# Patient Record
Sex: Male | Born: 1950
Health system: Southern US, Community
[De-identification: ages and names within clinical notes are randomized; demographics above are authoritative.]

## PROBLEM LIST (undated history)

## (undated) DIAGNOSIS — M5416 Radiculopathy, lumbar region: Secondary | ICD-10-CM

## (undated) DIAGNOSIS — I10 Essential (primary) hypertension: Secondary | ICD-10-CM

## (undated) DIAGNOSIS — E785 Hyperlipidemia, unspecified: Secondary | ICD-10-CM

## (undated) DIAGNOSIS — I471 Supraventricular tachycardia, unspecified: Secondary | ICD-10-CM

## (undated) DIAGNOSIS — M1711 Unilateral primary osteoarthritis, right knee: Secondary | ICD-10-CM

## (undated) HISTORY — DX: Unilateral primary osteoarthritis, right knee: M17.11

## (undated) HISTORY — DX: Hyperlipidemia, unspecified: E78.5

## (undated) HISTORY — DX: Supraventricular tachycardia, unspecified: I47.10

## (undated) HISTORY — DX: Radiculopathy, lumbar region: M54.16

## (undated) HISTORY — DX: Essential (primary) hypertension: I10

## (undated) HISTORY — DX: Supraventricular tachycardia: I47.1

---

## 2012-05-21 ENCOUNTER — Encounter: Payer: Self-pay | Admitting: Cardiology

## 2012-09-22 ENCOUNTER — Encounter: Payer: Self-pay | Admitting: Cardiology

## 2012-12-29 ENCOUNTER — Ambulatory Visit (INDEPENDENT_AMBULATORY_CARE_PROVIDER_SITE_OTHER): Payer: No Typology Code available for payment source | Admitting: Sports Medicine

## 2012-12-29 ENCOUNTER — Encounter: Payer: Self-pay | Admitting: Sports Medicine

## 2012-12-29 VITALS — BP 154/90 | HR 73 | Wt 263.0 lb

## 2012-12-29 DIAGNOSIS — M1710 Unilateral primary osteoarthritis, unspecified knee: Secondary | ICD-10-CM

## 2012-12-29 DIAGNOSIS — IMO0002 Reserved for concepts with insufficient information to code with codable children: Secondary | ICD-10-CM

## 2012-12-29 DIAGNOSIS — M170 Bilateral primary osteoarthritis of knee: Secondary | ICD-10-CM | POA: Insufficient documentation

## 2012-12-29 DIAGNOSIS — M1711 Unilateral primary osteoarthritis, right knee: Secondary | ICD-10-CM

## 2012-12-29 MED ORDER — MELOXICAM 15 MG PO TABS: ORAL_TABLET | ORAL | Status: DC

## 2012-12-29 NOTE — Assessment & Plan Note (Signed)
Robert Guerrero has right knee osteoarthritis with degenerative meniscal tear. I performed an aspiration and injection of his right knee, he will take meloxicam, and do home exercises. Like to see him back in about 4 weeks to see how things are going. He should also use his knee brace on a daily basis.

## 2012-12-29 NOTE — Progress Notes (Signed)
  Subjective:    CC: Right knee pain  HPI:  This is a very pleasant 62 year old male, he's had years of knee pain he localizes at the medial joint line on off. He just now got insurance and has been able to have this treated. He recently saw orthopedic surgeon who obtained an MRI the results of which will be dictated below. He recommended surgical treatment initially. Currently symptoms are worse with pain at the medial joint line with occasional popping, locking, clicking, but no buckling. Moderate, persistent. He does get significant swelling. Has used an occasional ibuprofen which is minimally effective.  Past medical history, Surgical history, Family history not pertinant except as noted below, Social history, Allergies, and medications have been entered into the medical record, reviewed, and no changes needed.   Review of Systems: No headache, visual changes, nausea, vomiting, diarrhea, constipation, dizziness, abdominal pain, skin rash, fevers, chills, night sweats, swollen lymph nodes, weight loss, chest pain, body aches, joint swelling, muscle aches, shortness of breath, mood changes, visual or auditory hallucinations.  Objective:    General: Well Developed, well nourished, and in no acute distress.  Neuro: Alert and oriented x3, extra-ocular muscles intact, sensation grossly intact.  HEENT: Normocephalic, atraumatic, pupils equal round reactive to light, neck supple, no masses, no lymphadenopathy, thyroid nonpalpable.  Skin: Warm and dry, no rashes noted.  Cardiac: Regular rate and rhythm, no murmurs rubs or gallops.  Respiratory: Clear to auscultation bilaterally. Not using accessory muscles, speaking in full sentences.  Abdominal: Soft, nontender, nondistended, positive bowel sounds, no masses, no organomegaly.  Right Knee: Visible effusion with palpable fluid. Tender to palpation at the medial joint line. ROM full in flexion and extension and lower leg rotation. Ligaments with  solid consistent endpoints including ACL, PCL, LCL, MCL. Positive McMurray's test with pain but no pop. Non painful patellar compression. Patellar glide without crepitus. Patellar and quadriceps tendons unremarkable. Hamstring and quadriceps strength is normal.   Procedure: Real-time Ultrasound Guided aspiration/Injection of right knee Device: GE Logiq E  Verbal informed consent obtained.  Time-out conducted.  Noted no overlying erythema, induration, or other signs of local infection.  Skin prepped in a sterile fashion.  Local anesthesia: Topical Ethyl chloride.  With sterile technique and under real time ultrasound guidance:  22-gauge needle advanced into the suprapatellar recess, 27 cc of serosanguineous fluid aspirated, it was initially straw-colored but became sanguinous subsequently. Syringe was switched and 2 cc Kenalog 40, 4 cc lidocaine injected easily into the joint. Completed without difficulty  Pain immediately resolved suggesting accurate placement of the medication.  Advised to call if fevers/chills, erythema, induration, drainage, or persistent bleeding.  Images permanently stored and available for review in the ultrasound unit.  Impression: Technically successful ultrasound guided injection.  MRI was reviewed and shows grade 2 and grade 3 chondromalacia in the patellofemoral joint as well as tearing of the posterior horn of the medial meniscus.  Impression and Recommendations:    The patient was counselled, risk factors were discussed, anticipatory guidance given.

## 2013-01-26 ENCOUNTER — Encounter: Payer: Self-pay | Admitting: Sports Medicine

## 2013-01-26 ENCOUNTER — Ambulatory Visit (INDEPENDENT_AMBULATORY_CARE_PROVIDER_SITE_OTHER): Payer: No Typology Code available for payment source | Admitting: Sports Medicine

## 2013-01-26 ENCOUNTER — Ambulatory Visit (INDEPENDENT_AMBULATORY_CARE_PROVIDER_SITE_OTHER): Payer: No Typology Code available for payment source

## 2013-01-26 VITALS — BP 164/85 | HR 94 | Wt 264.0 lb

## 2013-01-26 DIAGNOSIS — M545 Low back pain: Secondary | ICD-10-CM

## 2013-01-26 DIAGNOSIS — M5416 Radiculopathy, lumbar region: Secondary | ICD-10-CM

## 2013-01-26 DIAGNOSIS — IMO0002 Reserved for concepts with insufficient information to code with codable children: Secondary | ICD-10-CM

## 2013-01-26 DIAGNOSIS — R9389 Abnormal findings on diagnostic imaging of other specified body structures: Secondary | ICD-10-CM

## 2013-01-26 MED ORDER — PREDNISONE 50 MG PO TABS: ORAL_TABLET | ORAL | Status: DC

## 2013-01-26 NOTE — Assessment & Plan Note (Signed)
This is fairly classic right-sided L5 radiculitis. Prednisone, x-rays, formal PT, home exercises if too expensive, return to see me in one month, then we can pursue an MRI if no better for interventional injection planning.

## 2013-01-26 NOTE — Progress Notes (Signed)
  Subjective:    CC: Back pain  HPI: This is a very pleasant 62 year old male with a history of osteoarthritis of the knee here for persistent low back pain. The pain is worse worse with prolonged standing or sitting, especially long car rides. It does not bother him at night. The pain also radiates into the right buttock and lateral thigh. He describes some nonspecific numbness in his toes but is unable to pinpoint the location. He denies any loss of bowel or bladder function. He has tried ibuprofen for the back pain without much relief.    Past medical history, Surgical history, Family history not pertinant except as noted below, Social history, Allergies, and medications have been entered into the medical record, reviewed, and no changes needed.   Review of Systems: No fevers, chills, night sweats, weight loss, chest pain, or shortness of breath.   Objective:    General: Well Developed, well nourished, and in no acute distress.  Neuro: Alert and oriented x3, extra-ocular muscles intact, sensation grossly intact.  HEENT: Normocephalic, atraumatic, pupils equal round reactive to light, neck supple, no masses, no lymphadenopathy, thyroid nonpalpable.  Skin: Warm and dry, no rashes. Cardiac: Regular rate and rhythm, no murmurs rubs or gallops, no lower extremity edema.  Respiratory: Clear to auscultation bilaterally. Not using accessory muscles, speaking in full sentences. Back Exam:  Inspection: Unremarkable  Motion: Flexion 45 deg, Extension 45 deg, Side Bending to 45 deg bilaterally,  Rotation to 45 deg bilaterally  SLR laying: Negative  XSLR laying: Negative  Palpable tenderness: None. FABER: negative. Sensory change: Gross sensation intact to all lumbar and sacral dermatomes.  Reflexes: 2+ at both patellar tendons, 2+ at achilles tendons, Babinski's downgoing.  Strength at foot  Plantar-flexion: 5/5 Dorsi-flexion: 5/5 Eversion: 5/5 Inversion: 5/5  Leg strength  Quad: 5/5 Hamstring:  5/5 Hip flexor: 5/5 Hip abductors: 5/5  Gait unremarkable.  Impression and Recommendations:    Assessment: This is a 62 year old male with symptoms consistent with L5 radiculitis.  Plan: 1. Meloxicam daily for 2 weeks, then as needed for pain 2. X-rays today of the lumbar spine 3. Refer to PT 4. Complete home exercises daily if unable to attend PT 5. 5-day course of prednisone  This note was originally written by Karin Lieu MS3.

## 2013-02-23 ENCOUNTER — Encounter: Payer: Self-pay | Admitting: Sports Medicine

## 2013-02-23 ENCOUNTER — Ambulatory Visit (INDEPENDENT_AMBULATORY_CARE_PROVIDER_SITE_OTHER): Payer: No Typology Code available for payment source | Admitting: Sports Medicine

## 2013-02-23 VITALS — BP 147/78 | HR 85 | Wt 269.0 lb

## 2013-02-23 DIAGNOSIS — IMO0002 Reserved for concepts with insufficient information to code with codable children: Secondary | ICD-10-CM

## 2013-02-23 DIAGNOSIS — M1710 Unilateral primary osteoarthritis, unspecified knee: Secondary | ICD-10-CM

## 2013-02-23 DIAGNOSIS — M5416 Radiculopathy, lumbar region: Secondary | ICD-10-CM

## 2013-02-23 DIAGNOSIS — M1711 Unilateral primary osteoarthritis, right knee: Secondary | ICD-10-CM

## 2013-02-23 MED ORDER — TRAMADOL HCL 50 MG PO TABS: ORAL_TABLET | ORAL | Status: DC

## 2013-02-23 MED ORDER — CYCLOBENZAPRINE HCL 10 MG PO TABS: ORAL_TABLET | ORAL | Status: DC

## 2013-02-23 NOTE — Assessment & Plan Note (Signed)
Continues to do very well after injection.

## 2013-02-23 NOTE — Assessment & Plan Note (Signed)
Persistent symptoms and no improvement despite prednisone or Mobic or home exercises. At this point we are going to obtain an MRI for interventional injection planning. He does have L4 on L5 spondylolisthesis. I'm also going to add cyclobenzaprine at bedtime and tramadol for pain during the day. Return to see me to go over MRI results.

## 2013-02-23 NOTE — Progress Notes (Signed)
  Subjective:    CC: Follow up  HPI: Right knee arthritis : pain resolved after injection.  Right lumbar radiculitis: No improvement with prednisone, Mobic, old rehabilitation exercises. X-rays did show L4 on L5 spondylolisthesis, pain continues to radiate down the right leg in L4 versus an L5 distribution.  Past medical history, Surgical history, Family history not pertinant except as noted below, Social history, Allergies, and medications have been entered into the medical record, reviewed, and no changes needed.   Review of Systems: No fevers, chills, night sweats, weight loss, chest pain, or shortness of breath.   Objective:    General: Well Developed, well nourished, and in no acute distress.  Neuro: Alert and oriented x3, extra-ocular muscles intact, sensation grossly intact.  HEENT: Normocephalic, atraumatic, pupils equal round reactive to light, neck supple, no masses, no lymphadenopathy, thyroid nonpalpable.  Skin: Warm and dry, no rashes. Cardiac: Regular rate and rhythm, no murmurs rubs or gallops, no lower extremity edema.  Respiratory: Clear to auscultation bilaterally. Not using accessory muscles, speaking in full sentences.  Impression and Recommendations:

## 2013-03-04 ENCOUNTER — Telehealth: Payer: Self-pay | Admitting: *Deleted

## 2013-03-04 NOTE — Telephone Encounter (Signed)
PA obtained for MRI Lumbar spine w/o. Auth #[id number].  Meyer Cory, LPN

## 2013-03-06 ENCOUNTER — Ambulatory Visit (HOSPITAL_BASED_OUTPATIENT_CLINIC_OR_DEPARTMENT_OTHER)
Admission: RE | Admit: 2013-03-06 | Discharge: 2013-03-06 | Disposition: A | Discharge status: Home / Self Care | Payer: No Typology Code available for payment source | Source: Ambulatory Visit | Attending: Sports Medicine | Admitting: Sports Medicine

## 2013-03-06 DIAGNOSIS — M545 Low back pain, unspecified: Secondary | ICD-10-CM | POA: Insufficient documentation

## 2013-03-06 DIAGNOSIS — M48061 Spinal stenosis, lumbar region without neurogenic claudication: Secondary | ICD-10-CM | POA: Insufficient documentation

## 2013-03-06 DIAGNOSIS — M5416 Radiculopathy, lumbar region: Secondary | ICD-10-CM

## 2013-03-06 DIAGNOSIS — M5126 Other intervertebral disc displacement, lumbar region: Secondary | ICD-10-CM | POA: Insufficient documentation

## 2013-03-09 ENCOUNTER — Encounter: Payer: Self-pay | Admitting: Sports Medicine

## 2013-03-09 ENCOUNTER — Ambulatory Visit (INDEPENDENT_AMBULATORY_CARE_PROVIDER_SITE_OTHER): Payer: No Typology Code available for payment source | Admitting: Sports Medicine

## 2013-03-09 VITALS — BP 155/94 | HR 94 | Wt 268.0 lb

## 2013-03-09 DIAGNOSIS — IMO0002 Reserved for concepts with insufficient information to code with codable children: Secondary | ICD-10-CM

## 2013-03-09 DIAGNOSIS — M5416 Radiculopathy, lumbar region: Secondary | ICD-10-CM

## 2013-03-09 MED ORDER — IBUPROFEN 200 MG PO TABS: 200.0000 mg | ORAL_TABLET | Freq: Four times a day (QID) | ORAL | Status: DC | PRN

## 2013-03-09 NOTE — Assessment & Plan Note (Signed)
Robert Guerrero has multilevel degenerative disc disease, worse at the L4-L5 level, where there is bilateral foraminal stenosis and central canal stenosis. He also has a disc protrusion at the L5-S1 level. At this point we have exhausted physical therapy, steroids, muscle relaxers, tramadol. We are going to proceed with interventional treatment in the form of a bilateral L4-L5 transforaminal epidural steroid injection. I would like to see him back about 2 weeks after the injection to evaluate his response. In terms of oral analgesics, ibuprofen 200 mg has been the most effective.

## 2013-03-09 NOTE — Progress Notes (Signed)
  Subjective:    CC: Follow up  HPI: Lumbar degenerative disc disease: with right-sided lumbar radiculitis, failed physical therapy, steroids, NSAIDs, muscle relaxers, he did obtain an MRI and is here to followup the results for interventional injection planning. Pain is moderate, radiating down the left leg and L4 versus an L5 distribution. Persistent.  Right knee osteoarthritis:  overall pain-free after the injection.  Past medical history, Surgical history, Family history not pertinant except as noted below, Social history, Allergies, and medications have been entered into the medical record, reviewed, and no changes needed.   Review of Systems: No fevers, chills, night sweats, weight loss, chest pain, or shortness of breath.   Objective:    General: Well Developed, well nourished, and in no acute distress.  Neuro: Alert and oriented x3, extra-ocular muscles intact, sensation grossly intact.  HEENT: Normocephalic, atraumatic, pupils equal round reactive to light, neck supple, no masses, no lymphadenopathy, thyroid nonpalpable.  Skin: Warm and dry, no rashes. Cardiac: Regular rate and rhythm, no murmurs rubs or gallops, no lower extremity edema.  Respiratory: Clear to auscultation bilaterally. Not using accessory muscles, speaking in full sentences.  MRI was personally reviewed, there is multilevel degenerative disc disease worse at the L4-L5 and L5-S1 levels, he does have moderate spinal stenosis as well as bilateral foraminal stenosis worse at the L4-L5 level.  Impression and Recommendations:

## 2013-03-15 ENCOUNTER — Ambulatory Visit
Admission: RE | Admit: 2013-03-15 | Discharge: 2013-03-15 | Disposition: A | Discharge status: Home / Self Care | Payer: No Typology Code available for payment source | Source: Ambulatory Visit | Attending: Sports Medicine | Admitting: Sports Medicine

## 2013-03-15 ENCOUNTER — Other Ambulatory Visit: Payer: Self-pay | Admitting: Sports Medicine

## 2013-03-15 DIAGNOSIS — M5416 Radiculopathy, lumbar region: Secondary | ICD-10-CM

## 2013-03-15 MED ORDER — METHYLPREDNISOLONE ACETATE 40 MG/ML INJ SUSP (RADIOLOG
120.0000 mg | Freq: Once | INTRAMUSCULAR | Status: AC
  Administered 2013-03-15: 120 mg via EPIDURAL

## 2013-03-15 MED ORDER — IOHEXOL 180 MG/ML  SOLN
1.0000 mL | Freq: Once | INTRAMUSCULAR | Status: AC | PRN
  Administered 2013-03-15: 1 mL via EPIDURAL

## 2013-03-29 ENCOUNTER — Encounter: Payer: Self-pay | Admitting: Sports Medicine

## 2013-03-29 ENCOUNTER — Ambulatory Visit (INDEPENDENT_AMBULATORY_CARE_PROVIDER_SITE_OTHER): Payer: No Typology Code available for payment source | Admitting: Sports Medicine

## 2013-03-29 VITALS — BP 151/86 | HR 84 | Wt 266.0 lb

## 2013-03-29 DIAGNOSIS — IMO0002 Reserved for concepts with insufficient information to code with codable children: Secondary | ICD-10-CM

## 2013-03-29 DIAGNOSIS — M5416 Radiculopathy, lumbar region: Secondary | ICD-10-CM

## 2013-03-29 DIAGNOSIS — R0789 Other chest pain: Secondary | ICD-10-CM

## 2013-03-29 MED ORDER — ASPIRIN EC 81 MG PO TBEC: 81.0000 mg | DELAYED_RELEASE_TABLET | Freq: Every day | ORAL | Status: DC

## 2013-03-29 MED ORDER — NITROGLYCERIN 0.4 MG SL SUBL: 0.4000 mg | SUBLINGUAL_TABLET | SUBLINGUAL | Status: DC | PRN

## 2013-03-29 NOTE — Assessment & Plan Note (Addendum)
At rest, 2-3 times per month, associated with radiation to the teeth and jaw, and presyncopal symptoms. Symptoms are atypical, and were improved with H2 blockers, and are worse when laying supine, I do think this likely represents more reflux-type symptoms. I am going to refer him to cardiology, I do suspect he still will need a stress test and he needs to follow this up with his primary care provider as I will only be managing his orthopedic problems. I'm also going to add a baby aspirin and nitroglycerin as needed.

## 2013-03-29 NOTE — Progress Notes (Signed)
  Subjective:    CC: Followup  HPI: Lumbar degenerative disc disease : With right-sided lumbar radiculitis, status post bilateral L5-S1 transforaminal epidural injection, pain is now resolved.  Knee pain/osteoarthritis: Resolved.  Chest pain: occurs about 2-3 times per month, substernal, radiates to the jaw and teeth, associated with occasional presyncopal symptoms, no nausea, diaphoresis, or radiation to the left arm, it does last for several hours and sometimes the entire day. He denies any exertional component and in fact tells me that he can walk up to 3 flights of stairs without getting any chest pain. It did wake him at night, and he did notice a slight improvement with H2 blockers. Mild, intermittent.  Past medical history, Surgical history, Family history not pertinant except as noted below, Social history, Allergies, and medications have been entered into the medical record, reviewed, and no changes needed.   Review of Systems: No fevers, chills, night sweats, weight loss, chest pain, or shortness of breath.   Objective:    General: Well Developed, well nourished, and in no acute distress.  Neuro: Alert and oriented x3, extra-ocular muscles intact, sensation grossly intact.  HEENT: Normocephalic, atraumatic, pupils equal round reactive to light, neck supple, no masses, no lymphadenopathy, thyroid nonpalpable.  Skin: Warm and dry, no rashes. Cardiac: Regular rate and rhythm, no murmurs rubs or gallops, no lower extremity edema.  Respiratory: Clear to auscultation bilaterally. Not using accessory muscles, speaking in full sentences. Back Exam:  Inspection: Unremarkable  Motion: Flexion 45 deg, Extension 45 deg, Side Bending to 45 deg bilaterally,  Rotation to 45 deg bilaterally  SLR laying: Negative  XSLR laying: Negative  Palpable tenderness: None. FABER: negative. Sensory change: Gross sensation intact to all lumbar and sacral dermatomes.  Reflexes: 2+ at both patellar  tendons, 2+ at achilles tendons, Babinski's downgoing.  Strength at foot  Plantar-flexion: 5/5 Dorsi-flexion: 5/5 Eversion: 5/5 Inversion: 5/5  Leg strength  Quad: 5/5 Hamstring: 5/5 Hip flexor: 5/5 Hip abductors: 5/5  Gait unremarkable.  Impression and Recommendations:

## 2013-03-29 NOTE — Assessment & Plan Note (Signed)
Resolved with bilateral L5-S1 transforaminal epidural steroid injections.

## 2013-04-07 ENCOUNTER — Encounter: Payer: Self-pay | Admitting: Cardiology

## 2013-05-17 ENCOUNTER — Encounter: Payer: Self-pay | Admitting: Cardiology

## 2013-05-17 ENCOUNTER — Encounter: Payer: Self-pay | Admitting: *Deleted

## 2013-05-19 ENCOUNTER — Encounter: Payer: Self-pay | Admitting: Cardiology

## 2013-05-19 ENCOUNTER — Encounter: Payer: Self-pay | Admitting: *Deleted

## 2013-05-19 ENCOUNTER — Ambulatory Visit (INDEPENDENT_AMBULATORY_CARE_PROVIDER_SITE_OTHER): Payer: No Typology Code available for payment source | Admitting: Cardiology

## 2013-05-19 VITALS — BP 144/90 | HR 99 | Ht 71.0 in | Wt 270.0 lb

## 2013-05-19 DIAGNOSIS — E785 Hyperlipidemia, unspecified: Secondary | ICD-10-CM

## 2013-05-19 DIAGNOSIS — R002 Palpitations: Secondary | ICD-10-CM | POA: Insufficient documentation

## 2013-05-19 DIAGNOSIS — I10 Essential (primary) hypertension: Secondary | ICD-10-CM

## 2013-05-19 DIAGNOSIS — R079 Chest pain, unspecified: Secondary | ICD-10-CM

## 2013-05-19 NOTE — Patient Instructions (Signed)
Your physician recommends that you schedule a follow-up appointment in: [place] has recommended that you wear an event monitor. Event monitors are medical devices that record the heart's electrical activity. Doctors most often [place] these monitors to diagnose arrhythmias. Arrhythmias are problems with the speed or rhythm of the heartbeat. The monitor is a small, portable device. You can wear one while you do your normal daily activities. This is usually used to diagnose what is causing palpitations/syncope (passing out).   Your physician has requested that you have a stress echocardiogram. For further information please visit [email or web address]. Please follow instruction sheet as given.

## 2013-05-19 NOTE — Assessment & Plan Note (Signed)
Obtain most recent lipids from primary care. May need therapy.

## 2013-05-19 NOTE — Progress Notes (Signed)
     HPI: 63 yo male for evaluation of chest pain. No prior cardiac history. Patient states that for the past 2 years he has had problems with chest pain and palpitations. He has the sudden onset of his heart "racing". Associated with this he would develop chest pressure, jaw pressure and bilateral arm pressure. There is dyspnea and diaphoresis but no nausea. The symptoms typically lasts 10-15 minutes and resolve spontaneously. He has had 6 episodes in the past 2 years. His most recent episode occurred 48-72 hours ago. They are not related to exertion. He has mild dyspnea with more extreme activities but not routine activities. No orthopnea, PND, pedal edema or exertional chest pain. No syncope.  Current Outpatient Prescriptions  Medication Sig Dispense Refill  . aspirin EC 81 MG tablet Take 1 tablet (81 mg total) by mouth daily.  90 tablet  3  . ibuprofen (ADVIL,MOTRIN) 200 MG tablet Take 1 tablet (200 mg total) by mouth every 6 (six) hours as needed.  30 tablet  0  . losartan-hydrochlorothiazide (HYZAAR) 100-25 MG per tablet Take 1 tablet by mouth daily.      . metoprolol (LOPRESSOR) 50 MG tablet Take one tablet once daily      . nitroGLYCERIN (NITROSTAT) 0.4 MG SL tablet Place 1 tablet (0.4 mg total) under the tongue every 5 (five) minutes as needed for chest pain (or tightness).  30 tablet  0  . meloxicam (MOBIC) 15 MG tablet One tab PO qAM with breakfast for 2 weeks, then daily prn pain.  30 tablet  3   No current facility-administered medications for this visit.    Allergies  Allergen Reactions  . Penicillins     Past Medical History  Diagnosis Date  . Osteoarthritis of right knee   . Right lumbar radiculitis   . Hypertension   . Hyperlipidemia     History reviewed. No pertinent past surgical history.  History   Social History  . Marital Status: Married    Spouse Name: N/A    Number of Children: 2  . Years of Education: N/A   Occupational History  .      Restaurant  owner   Social History Main Topics  . Smoking status: Never Smoker   . Smokeless tobacco: Not on file  . Alcohol Use: Yes     Comment: Occasional  . Drug Use: Not on file  . Sexual Activity: Not on file   Other Topics Concern  . Not on file   Social History Narrative  . No narrative on file    Family History  Problem Relation Age of Onset  . CAD Father     MI at age 59-60    ROS: arthralgias but no fevers or chills, productive cough, hemoptysis, dysphasia, odynophagia, melena, hematochezia, dysuria, hematuria, rash, seizure activity, orthopnea, PND, pedal edema, claudication. Remaining systems are negative.  Physical Exam:   Blood pressure 144/90, pulse 99, height 5\' 11"  (1.803 m), weight 270 lb (122.471 kg).  General:  Well developed/well nourished in NAD Skin warm/dry Patient not depressed No peripheral clubbing Back-normal HEENT-normal/normal eyelids Neck supple/normal carotid upstroke bilaterally; no bruits; no JVD; no thyromegaly chest - CTA/ normal expansion CV - RRR/normal S1 and S2; no murmurs, rubs or gallops;  PMI nondisplaced Abdomen -NT/ND, no HSM, no mass, + bowel sounds, no bruit 2+ femoral pulses, no bruits Ext-no edema, chords, 2+ DP Neuro-grossly nonfocal  ECG Sinus rhythm with no ST changes

## 2013-05-19 NOTE — Assessment & Plan Note (Signed)
Stress echocardiogram for risk stratification. Symptoms are concerning but only occur in the setting of palpitations. No exertional chest pain.

## 2013-05-19 NOTE — Assessment & Plan Note (Signed)
Blood pressure mildly elevated. However he has not taken metoprolol. Continue present medications and follow.

## 2013-05-19 NOTE — Assessment & Plan Note (Signed)
Patient sounds to be having possible SVT. Plan [facility] to further evaluate. Obtain most recent laboratories from primary care including TSH. Schedule stress echocardiogram.

## 2013-06-03 ENCOUNTER — Telehealth: Payer: Self-pay | Admitting: Cardiology

## 2013-06-03 NOTE — Telephone Encounter (Signed)
Labs Rec From [facility] office, gave to Luisa Dago

## 2013-06-07 ENCOUNTER — Telehealth: Payer: Self-pay | Admitting: Cardiology

## 2013-06-07 NOTE — Telephone Encounter (Signed)
Following up    Pt returning your call pt is still at work.

## 2013-06-11 ENCOUNTER — Encounter: Payer: Self-pay | Admitting: Cardiology

## 2013-06-11 ENCOUNTER — Encounter: Payer: Self-pay | Admitting: Radiology

## 2013-06-11 ENCOUNTER — Ambulatory Visit (HOSPITAL_COMMUNITY): Payer: No Typology Code available for payment source | Attending: Cardiology

## 2013-06-11 ENCOUNTER — Encounter (HOSPITAL_BASED_OUTPATIENT_CLINIC_OR_DEPARTMENT_OTHER): Payer: Self-pay

## 2013-06-11 ENCOUNTER — Telehealth: Payer: Self-pay | Admitting: *Deleted

## 2013-06-11 DIAGNOSIS — R002 Palpitations: Secondary | ICD-10-CM

## 2013-06-11 DIAGNOSIS — R0989 Other specified symptoms and signs involving the circulatory and respiratory systems: Secondary | ICD-10-CM

## 2013-06-11 DIAGNOSIS — R072 Precordial pain: Secondary | ICD-10-CM

## 2013-06-11 DIAGNOSIS — R079 Chest pain, unspecified: Secondary | ICD-10-CM | POA: Insufficient documentation

## 2013-06-11 NOTE — Progress Notes (Signed)
Stress Echocardiogram performed.  

## 2013-06-11 NOTE — Progress Notes (Signed)
Patient ID: Robert Guerrero, male   DOB: 08-23-1950, 63 y.o.   MRN: [id number] E cardio 30 day monitor applied

## 2013-06-11 NOTE — Telephone Encounter (Signed)
Patient just had event monitor placed this afternoon.  Call from [facility] of critical rhythm--pt had svt rate of 207.  Spoke with patient, he was symptomatic having aching in his jaw, bilateral posterior arms and heart racing.  This is like the episode he had in January.  This started while he was driving home from the Dr. Gabriel Carina.  He feels the symptoms are subsiding now.  Discussed with Dr. Lovena Le (DOD) He wants patient to take extra metoprolol today. --patient did not take any lopressor since Wednesday night.  He takes it at night and held it for his stress test today. So he is taking a dose now and at bedtime.  Strips printed from monitor room.    Dr. Lovena Le also states he will follow this patient if it is okay with Dr. Stanford Breed.

## 2013-06-11 NOTE — Telephone Encounter (Signed)
Instructed patient that if the symptoms return over the weekend and are sustaining he could go to ED.  Also reassured him that taking the metoprolol should help prevent further episodes.

## 2013-06-13 NOTE — Telephone Encounter (Signed)
EP eval for ablation Kirk Ruths

## 2013-06-14 NOTE — Telephone Encounter (Signed)
Left message for pt to call.

## 2013-06-14 NOTE — Telephone Encounter (Signed)
Spoke with pt, aware monitor shows SVT. Per dr Stanford Breed pt will see dr gregg taylor to discuss ablation. Follow up scheduled

## 2013-06-21 ENCOUNTER — Telehealth: Payer: Self-pay | Admitting: Cardiology

## 2013-06-21 NOTE — Telephone Encounter (Signed)
New message ° ° °Patient returning call back to nurse.  °

## 2013-06-21 NOTE — Telephone Encounter (Signed)
Spoke with pt, aware of stress test results. 

## 2013-07-07 ENCOUNTER — Ambulatory Visit (INDEPENDENT_AMBULATORY_CARE_PROVIDER_SITE_OTHER): Payer: No Typology Code available for payment source | Admitting: Internal Medicine

## 2013-07-07 ENCOUNTER — Encounter: Payer: Self-pay | Admitting: Internal Medicine

## 2013-07-07 VITALS — BP 142/78 | HR 81 | Ht 71.0 in | Wt 262.0 lb

## 2013-07-07 DIAGNOSIS — I498 Other specified cardiac arrhythmias: Secondary | ICD-10-CM

## 2013-07-07 DIAGNOSIS — I471 Supraventricular tachycardia: Secondary | ICD-10-CM | POA: Insufficient documentation

## 2013-07-07 MED ORDER — VERAPAMIL HCL ER 180 MG PO TBCR: 180.0000 mg | EXTENDED_RELEASE_TABLET | Freq: Every day | ORAL | Status: DC

## 2013-07-07 NOTE — Patient Instructions (Signed)
Your physician recommends that you schedule a follow-up appointment in: 3 months with Dr Lovena Le  Your physician has recommended that you have an ablation. Catheter ablation is a medical procedure used to treat some cardiac arrhythmias (irregular heartbeats). During catheter ablation, a long, thin, flexible tube is put into a blood vessel in your groin (upper thigh), or neck. This tube is called an ablation catheter. It is then guided to your heart through the blood vessel. Radio frequency waves destroy small areas of heart tissue where abnormal heartbeats may cause an arrhythmia to start. Please see the instruction sheet given to you today.  Dates: 4/27,4/30,5/6,5/8,5/12,5/15,5/20,5/21,5/27,5/29  Your physician has recommended you make the following change in your medication:  1) Stop Metoprolol 2) Start Verapamil 180mg  daily

## 2013-07-07 NOTE — Assessment & Plan Note (Addendum)
He has symptomatic SVT despite medical therapy with beta blockers. While he has had fewer episodes of SVT on metoprolol, he feels weak and fatigued. He would like to stop these meds. I have recommended catheter ablation but have offered to switch his meds. He will reflect on catheter ablation and call us if he would like to proceed. In the interim, will switch his beta blocker to a calcium channel blocker

## 2013-07-07 NOTE — Progress Notes (Signed)
      HPI [name] is referred today by Dr. Stanford Guerrero for evaluation of SVT. He is a pleasant 63 yo man with a h/o tachypalpitations and documented SVT at rates of over 200/min. The episodes start and stop suddenly and are associated with sob and chest pressure. These episodes may last up to an hour. They will stop with vagal maneuvers. He has been treated with beta blockers. He has felt weak and tired on his beta blocker. Despite symptomatic SVT on medical therapy, he is not inclined to proceed with ablation therapy.  Allergies  Allergen Reactions  . Penicillins Hives     Current Outpatient Prescriptions  Medication Sig Dispense Refill  . aspirin EC 81 MG tablet Take 1 tablet (81 mg total) by mouth daily.  90 tablet  3  . ibuprofen (ADVIL,MOTRIN) 200 MG tablet Take 1 tablet (200 mg total) by mouth every 6 (six) hours as needed.  30 tablet  0  . losartan-hydrochlorothiazide (HYZAAR) 100-25 MG per tablet Take 1 tablet by mouth daily. Pt states he has only been taking 1/2 tablet because his BP at home has been running low (07/07/13)      . nitroGLYCERIN (NITROSTAT) 0.4 MG SL tablet Place 1 tablet (0.4 mg total) under the tongue every 5 (five) minutes as needed for chest pain (or tightness).  30 tablet  0  . verapamil (CALAN-SR) 180 MG CR tablet Take 1 tablet (180 mg total) by mouth at bedtime.  90 tablet  3   No current facility-administered medications for this visit.     Past Medical History  Diagnosis Date  . Osteoarthritis of right knee   . Right lumbar radiculitis   . Hypertension   . Hyperlipidemia   . SVT (supraventricular tachycardia)     ROS:   All systems reviewed and negative except as noted in the HPI.   History reviewed. No pertinent past surgical history.   Family History  Problem Relation Age of Onset  . CAD Father     MI at age 36-60     History   Social History  . Marital Status: Married    Spouse Name: N/A    Number of Children: 2  . Years of  Education: N/A   Occupational History  .      Restaurant owner   Social History Main Topics  . Smoking status: Never Smoker   . Smokeless tobacco: Not on file  . Alcohol Use: Yes     Comment: Occasional  . Drug Use: Not on file  . Sexual Activity: Not on file   Other Topics Concern  . Not on file   Social History Narrative  . No narrative on file     BP 142/78  Pulse 81  Ht 5\' 11"  (1.803 m)  Wt 262 lb (118.842 kg)  BMI 36.56 kg/m2  Physical Exam:  Well appearing 63 yo man, NAD HEENT: Unremarkable Neck:  No JVD, no thyromegally Back:  No CVA tenderness Lungs:  Clear with no wheezes HEART:  Regular rate rhythm, no murmurs, no rubs, no clicks Abd:  soft, positive bowel sounds, no organomegally, no rebound, no guarding Ext:  2 plus pulses, no edema, no cyanosis, no clubbing Skin:  No rashes no nodules Neuro:  CN II through XII intact, motor grossly intact  EKG - NSR with no ventricular pre-excitation  Assess/Plan:

## 2013-07-14 ENCOUNTER — Ambulatory Visit (INDEPENDENT_AMBULATORY_CARE_PROVIDER_SITE_OTHER): Payer: No Typology Code available for payment source | Admitting: Cardiology

## 2013-07-14 ENCOUNTER — Encounter: Payer: Self-pay | Admitting: Cardiology

## 2013-07-14 VITALS — BP 118/80 | HR 80 | Ht 71.0 in | Wt 262.0 lb

## 2013-07-14 DIAGNOSIS — I471 Supraventricular tachycardia, unspecified: Secondary | ICD-10-CM

## 2013-07-14 DIAGNOSIS — I498 Other specified cardiac arrhythmias: Secondary | ICD-10-CM

## 2013-07-14 DIAGNOSIS — E785 Hyperlipidemia, unspecified: Secondary | ICD-10-CM

## 2013-07-14 DIAGNOSIS — I10 Essential (primary) hypertension: Secondary | ICD-10-CM

## 2013-07-14 NOTE — Assessment & Plan Note (Signed)
Management per primary care. 

## 2013-07-14 NOTE — Progress Notes (Signed)
      HPI: FU SVT; Stress echo in March 2015 normal. Monitor showed SVT. Seen by Dr. Lovena Le and beta blocker was changed to calcium blocker because of fatigue. Ablation recommended if patient is considering. Since he was last seen, He denies dyspnea on exertion, orthopnea, PND, pedal edema, exertional chest pain or syncope. He has not had any bouts of SVT since seeing Dr. Lovena Le. He did not tolerate beta-blockade because of fatigue. He did not tolerate verapamil because of constipation.   Current Outpatient Prescriptions  Medication Sig Dispense Refill  . aspirin EC 81 MG tablet Take 1 tablet (81 mg total) by mouth daily.  90 tablet  3  . ibuprofen (ADVIL,MOTRIN) 200 MG tablet Take 1 tablet (200 mg total) by mouth every 6 (six) hours as needed.  30 tablet  0  . losartan-hydrochlorothiazide (HYZAAR) 100-25 MG per tablet Take 1 tablet by mouth daily.       . nitroGLYCERIN (NITROSTAT) 0.4 MG SL tablet Place 1 tablet (0.4 mg total) under the tongue every 5 (five) minutes as needed for chest pain (or tightness).  30 tablet  0  . verapamil (CALAN-SR) 180 MG CR tablet Take 1 tablet (180 mg total) by mouth at bedtime.  90 tablet  3   No current facility-administered medications for this visit.     Past Medical History  Diagnosis Date  . Osteoarthritis of right knee   . Right lumbar radiculitis   . Hypertension   . Hyperlipidemia   . SVT (supraventricular tachycardia)     History reviewed. No pertinent past surgical history.  History   Social History  . Marital Status: Married    Spouse Name: N/A    Number of Children: 2  . Years of Education: N/A   Occupational History  .      Restaurant owner   Social History Main Topics  . Smoking status: Never Smoker   . Smokeless tobacco: Not on file  . Alcohol Use: Yes     Comment: Occasional  . Drug Use: Not on file  . Sexual Activity: Not on file   Other Topics Concern  . Not on file   Social History Narrative  . No narrative on  file    ROS: no fevers or chills, productive cough, hemoptysis, dysphasia, odynophagia, melena, hematochezia, dysuria, hematuria, rash, seizure activity, orthopnea, PND, pedal edema, claudication. Remaining systems are negative.  Physical Exam: Well-developed well-nourished in no acute distress.  Skin is warm and dry.  HEENT is normal.  Neck is supple.  Chest is clear to auscultation with normal expansion.  Cardiovascular exam is regular rate and rhythm.  Abdominal exam nontender or distended. No masses palpated. Extremities show no edema. neuro grossly intact

## 2013-07-14 NOTE — Assessment & Plan Note (Signed)
Blood pressure controlled. Continue present medications. 

## 2013-07-14 NOTE — Assessment & Plan Note (Signed)
Patient has documented SVT.He did not tolerate verapamil because of constipation. Beta blockers caused fatigue. He does not want to pursue ablation at this point but will consider if his symptoms become refractory. He will take Toprol as needed.

## 2013-07-14 NOTE — Patient Instructions (Signed)
Your physician wants you to follow-up in: 3 MONTHS WITH DR CRENSHAW You will receive a reminder letter in the mail two months in advance. If you don't receive a letter, please call our office to schedule the follow-up appointment.  

## 2013-08-05 ENCOUNTER — Telehealth: Payer: Self-pay | Admitting: *Deleted

## 2013-08-05 NOTE — Telephone Encounter (Signed)
Spoke with pt, Follow up scheduled  

## 2013-08-05 NOTE — Telephone Encounter (Signed)
Monitor reviewed by dr Stanford Breed shows sinus with PSVT and PAF Pt needs follow up ov per dr Stanford Breed  Left message for pt to call

## 2013-09-15 ENCOUNTER — Ambulatory Visit: Payer: No Typology Code available for payment source | Admitting: Cardiology

## 2015-05-23 ENCOUNTER — Other Ambulatory Visit: Payer: Self-pay | Admitting: Family Medicine

## 2015-05-23 DIAGNOSIS — G8929 Other chronic pain: Secondary | ICD-10-CM

## 2015-05-23 DIAGNOSIS — M545 Low back pain: Principal | ICD-10-CM

## 2015-06-05 ENCOUNTER — Encounter: Payer: Self-pay | Admitting: Sports Medicine

## 2015-06-05 ENCOUNTER — Ambulatory Visit (INDEPENDENT_AMBULATORY_CARE_PROVIDER_SITE_OTHER): Payer: Commercial Managed Care - HMO | Admitting: Sports Medicine

## 2015-06-05 VITALS — BP 165/96 | HR 71 | Wt 271.0 lb

## 2015-06-05 DIAGNOSIS — M1711 Unilateral primary osteoarthritis, right knee: Secondary | ICD-10-CM

## 2015-06-05 DIAGNOSIS — R209 Unspecified disturbances of skin sensation: Secondary | ICD-10-CM | POA: Diagnosis not present

## 2015-06-05 DIAGNOSIS — M5416 Radiculopathy, lumbar region: Secondary | ICD-10-CM

## 2015-06-05 DIAGNOSIS — M5417 Radiculopathy, lumbosacral region: Secondary | ICD-10-CM

## 2015-06-05 DIAGNOSIS — R202 Paresthesia of skin: Secondary | ICD-10-CM | POA: Diagnosis not present

## 2015-06-05 DIAGNOSIS — G5603 Carpal tunnel syndrome, bilateral upper limbs: Secondary | ICD-10-CM | POA: Insufficient documentation

## 2015-06-05 DIAGNOSIS — R7303 Prediabetes: Secondary | ICD-10-CM | POA: Diagnosis not present

## 2015-06-05 LAB — CBC
HCT: 45.4 % (ref 39.0–52.0)
Hemoglobin: 15.6 g/dL (ref 13.0–17.0)
MCH: 30.9 pg (ref 26.0–34.0)
MCHC: 34.4 g/dL (ref 30.0–36.0)
MCV: 89.9 fL (ref 78.0–100.0)
MPV: 11.1 fL (ref 8.6–12.4)
Platelets: 213 K/uL (ref 150–400)
RBC: 5.05 MIL/uL (ref 4.22–5.81)
RDW: 14.3 % (ref 11.5–15.5)
WBC: 4.9 K/uL (ref 4.0–10.5)

## 2015-06-05 LAB — VITAMIN B12: Vitamin B-12: 267 pg/mL (ref 200–1100)

## 2015-06-05 LAB — HEMOGLOBIN A1C
Hgb A1c MFr Bld: 6.2 % — ABNORMAL HIGH (ref <5.7)
Mean Plasma Glucose: 131 mg/dL — ABNORMAL HIGH (ref <117)

## 2015-06-05 MED ORDER — MELOXICAM 15 MG PO TABS: ORAL_TABLET | ORAL | Status: DC

## 2015-06-05 NOTE — Progress Notes (Signed)
  Subjective:    CC: multiple issues  HPI: This is a pleasant 65 year old male, we treated him for lumbar degenerative disc disease in the past, he had a bilateral L5-S1 transforaminal epidural that provided good but short-term relief. He returns today 3 years later desiring repeat interventional treatment. Symptoms are similar, pain is axial, discogenic with radiation down the right leg in an L5 and S1 type distribution.  Right knee pain: Primary osteoarthritis previously diagnosed, last injection was 3 years ago, desires repeat, pain is moderate, persistent without radiation.  Paresthesias: has noted numbness and tingling at the tips of his fingers in the tips of his toes, is unsure exactly what workup his PCP has done,but tells me he is taking vitamin B12 supplementation. Has never had nerve conduction or electromyography.  Past medical history, Surgical history, Family history not pertinant except as noted below, Social history, Allergies, and medications have been entered into the medical record, reviewed, and no changes needed.   Review of Systems: No fevers, chills, night sweats, weight loss, chest pain, or shortness of breath.   Objective:    General: Well Developed, well nourished, and in no acute distress.  Neuro: Alert and oriented x3, extra-ocular muscles intact, sensation grossly intact.  HEENT: Normocephalic, atraumatic, pupils equal round reactive to light, neck supple, no masses, no lymphadenopathy, thyroid nonpalpable.  Skin: Warm and dry, no rashes. Cardiac: Regular rate and rhythm, no murmurs rubs or gallops, no lower extremity edema.  Respiratory: Clear to auscultation bilaterally. Not using accessory muscles, speaking in full sentences.  Procedure: Real-time Ultrasound Guided Injection of right knee Device: GE Logiq E  Verbal informed consent obtained.  Time-out conducted.  Noted no overlying erythema, induration, or other signs of local infection.  Skin prepped in a  sterile fashion.  Local anesthesia: Topical Ethyl chloride.  With sterile technique and under real time ultrasound guidance:  1 mL kenalog 40, 2 mL lidocaine, 2 mL Marcaine injected easily. Completed without difficulty  Pain immediately resolved suggesting accurate placement of the medication.  Advised to call if fevers/chills, erythema, induration, drainage, or persistent bleeding.  Images permanently stored and available for review in the ultrasound unit.  Impression: Technically successful ultrasound guided injection.  Impression and Recommendations:    I spent 40 minutes with this patient, greater than 50% was face-to-face time counseling regarding the above diagnoses, that is separate from the time spent performing the above procedure

## 2015-06-05 NOTE — Assessment & Plan Note (Signed)
Repeat right L5-S1 interlaminar epidural.  He does also have moderate multilevel facet arthritis that conservative future interventional target.  Adding meloxicam for daily use.

## 2015-06-05 NOTE — Assessment & Plan Note (Signed)
Per patient has a history of a B12 deficiency.  Checking vitamin B12 levels, methylmalonic acid levels, nerve conduction study.

## 2015-06-05 NOTE — Assessment & Plan Note (Signed)
2 half year response to previous injection, repeat right knee injection today.

## 2015-06-09 LAB — METHYLMALONIC ACID, SERUM: Methylmalonic Acid, Quant: 121 nmol/L (ref 87–318)

## 2015-06-19 ENCOUNTER — Ambulatory Visit
Admission: RE | Admit: 2015-06-19 | Discharge: 2015-06-19 | Disposition: A | Discharge status: Home / Self Care | Payer: Commercial Managed Care - HMO | Source: Ambulatory Visit | Attending: Sports Medicine | Admitting: Sports Medicine

## 2015-06-19 DIAGNOSIS — M5126 Other intervertebral disc displacement, lumbar region: Secondary | ICD-10-CM | POA: Diagnosis not present

## 2015-06-19 MED ORDER — METHYLPREDNISOLONE ACETATE 40 MG/ML INJ SUSP (RADIOLOG
120.0000 mg | Freq: Once | INTRAMUSCULAR | Status: AC
  Administered 2015-06-19: 120 mg via EPIDURAL

## 2015-06-19 MED ORDER — IOHEXOL 180 MG/ML  SOLN
1.0000 mL | Freq: Once | INTRAMUSCULAR | Status: AC | PRN
  Administered 2015-06-19: 1 mL via EPIDURAL

## 2015-06-19 NOTE — Discharge Instructions (Signed)

## 2015-06-29 ENCOUNTER — Encounter (INDEPENDENT_AMBULATORY_CARE_PROVIDER_SITE_OTHER): Payer: Self-pay | Admitting: Diagnostic Neuroimaging

## 2015-06-29 ENCOUNTER — Ambulatory Visit (INDEPENDENT_AMBULATORY_CARE_PROVIDER_SITE_OTHER): Payer: Commercial Managed Care - HMO | Admitting: Diagnostic Neuroimaging

## 2015-06-29 DIAGNOSIS — Z0289 Encounter for other administrative examinations: Secondary | ICD-10-CM

## 2015-06-29 DIAGNOSIS — R200 Anesthesia of skin: Secondary | ICD-10-CM

## 2015-06-29 NOTE — Procedures (Signed)
   [facility]  NCS (NERVE CONDUCTION STUDY) WITH EMG (ELECTROMYOGRAPHY) REPORT   STUDY DATE: 06/29/15 PATIENT NAME: Robert Guerrero DOB: 1950/08/16 MRN: [id number]  ORDERING CLINICIAN: Silverio Decamp, MD  TECHNOLOGIST: Laretta Alstrom  ELECTROMYOGRAPHER: Earlean Polka. Penumalli, MD  CLINICAL INFORMATION: 65 year old male with numbness in fingers and toes. Right hand more affected than other parts of body. Exam notable for severe atrophy of abductor pollicis brevis muscle on the right.   FINDINGS: NERVE CONDUCTION STUDY: Right median motor response has prolonged distal latency (7.7 ms, normal less than or equal to 4.2 ms), decreased amplitude, normal conduction velocity and prolonged F-wave latency.  Left median motor response is prolonged distal latency (6.0 ms), normal amplitude, normal conduction velocity and normal F-wave latency.  Bilateral ulnar, peroneal, tibial motor responses and F wave latencies are normal.  Right median sensory response could not be obtained. Left median, bilateral ulnar and bilateral peroneal sensory responses are normal.   NEEDLE ELECTROMYOGRAPHY: Needle examination of right upper and lower extremity muscles (deltoid, biceps, triceps, flexor carpi radialis, first dorsal interosseous, vastus medialis, tibialis anterior, gastrocnemius) is normal.   IMPRESSION:  Abnormal study demonstrating: 1. Bilateral median neuropathies at the wrist consistent with severe right and moderate left carpal tunnel syndrome. 2. No evidence of underlying large fiber neuropathy.   INTERPRETING PHYSICIAN:  Penni Bombard, MD Certified in Neurology, Neurophysiology and Neuroimaging  [facility] [address], [place], [place] [address] [phone]

## 2015-07-03 ENCOUNTER — Ambulatory Visit: Payer: Commercial Managed Care - HMO | Admitting: Sports Medicine

## 2015-07-03 DIAGNOSIS — G4700 Insomnia, unspecified: Secondary | ICD-10-CM | POA: Diagnosis not present

## 2015-07-03 DIAGNOSIS — I10 Essential (primary) hypertension: Secondary | ICD-10-CM | POA: Diagnosis not present

## 2015-07-03 DIAGNOSIS — N400 Enlarged prostate without lower urinary tract symptoms: Secondary | ICD-10-CM | POA: Diagnosis not present

## 2015-07-04 ENCOUNTER — Encounter: Payer: Self-pay | Admitting: Sports Medicine

## 2015-07-04 ENCOUNTER — Ambulatory Visit (INDEPENDENT_AMBULATORY_CARE_PROVIDER_SITE_OTHER): Payer: Commercial Managed Care - HMO | Admitting: Sports Medicine

## 2015-07-04 ENCOUNTER — Telehealth: Payer: Self-pay | Admitting: Sports Medicine

## 2015-07-04 VITALS — BP 120/71 | HR 111 | Resp 18 | Wt 270.0 lb

## 2015-07-04 DIAGNOSIS — M1711 Unilateral primary osteoarthritis, right knee: Secondary | ICD-10-CM

## 2015-07-04 DIAGNOSIS — M5417 Radiculopathy, lumbosacral region: Secondary | ICD-10-CM | POA: Diagnosis not present

## 2015-07-04 DIAGNOSIS — G5603 Carpal tunnel syndrome, bilateral upper limbs: Secondary | ICD-10-CM | POA: Diagnosis not present

## 2015-07-04 DIAGNOSIS — M5416 Radiculopathy, lumbar region: Secondary | ICD-10-CM

## 2015-07-04 NOTE — Assessment & Plan Note (Signed)
Has failed conservative measures including nighttime splinting. Bilateral median nerve hydrodissection. Return in one month, continue nighttime splinting until follow-up visit.

## 2015-07-04 NOTE — Assessment & Plan Note (Signed)
Setting up for right-sided Orthovisc, partial response to steroid injection

## 2015-07-04 NOTE — Assessment & Plan Note (Signed)
Good response to her right L5-S1 interlaminar epidural.  Discogenic pain has resolved but he still has facet type pain with extension.  setting him up for facet injections.  facet arthritis is worst at the L3-L4 and L4-L5 levels, pain is predominantly right-sided, we're going to do these facet injections.

## 2015-07-04 NOTE — Telephone Encounter (Signed)
Submitted for approval on Orthovisc. Awaiting confirmation.  

## 2015-07-04 NOTE — Telephone Encounter (Signed)
-----   Message from Silverio Decamp, MD sent at 07/04/2015  9:13 AM EDT -----  [place] confirmed right knee osteoarthritis, failed steroid injection, set up for Orthovisc. ___________________________________________ Gwen Her. Dianah Field, M.D., ABFM., CAQSM. Primary Care and [place] Instructor of [place] [employer] [facility]

## 2015-07-04 NOTE — Progress Notes (Signed)
  Subjective:    CC: follow-up multiple issues  HPI: Lumbar spondylosis: Good response to discogenic and radicular pain with L5-S1 interlaminar epidural, still with some right-sided axial pain on extension. There was severe L3-L4 and L4-L5 facet arthritis on his MRI.  Hand paresthesias: Nerve conduction study showed moderate to severe bilateral carpal tunnel syndrome at the wrist. Agreeable to proceed with injection, has failed splinting in the past.  Right knee osteoarthritis: Partial response to steroid injection at the last visit, agreeable to proceed with viscous supplementation.  Past medical history, Surgical history, Family history not pertinant except as noted below, Social history, Allergies, and medications have been entered into the medical record, reviewed, and no changes needed.   Review of Systems: No fevers, chills, night sweats, weight loss, chest pain, or shortness of breath.   Objective:    General: Well Developed, well nourished, and in no acute distress.  Neuro: Alert and oriented x3, extra-ocular muscles intact, sensation grossly intact.  HEENT: Normocephalic, atraumatic, pupils equal round reactive to light, neck supple, no masses, no lymphadenopathy, thyroid nonpalpable.  Skin: Warm and dry, no rashes. Cardiac: Regular rate and rhythm, no murmurs rubs or gallops, no lower extremity edema.  Respiratory: Clear to auscultation bilaterally. Not using accessory muscles, speaking in full sentences.  Procedure: Real-time Ultrasound Guided Injection of Right median nerve hydrodissection Device: GE Logiq E  Verbal informed consent obtained.  Time-out conducted.  Noted no overlying erythema, induration, or other signs of local infection.  Skin prepped in a sterile fashion.  Local anesthesia: Topical Ethyl chloride.  With sterile technique and under real time ultrasound guidance:  Using a 25-gauge needle and injected medication both superficial to and deep to the median  nerve in the carpal tunnel, a total of 1 mL kenalog 40, 4 mL lidocaine was used. Completed without difficulty  Pain immediately resolved suggesting accurate placement of the medication.  Advised to call if fevers/chills, erythema, induration, drainage, or persistent bleeding.  Images permanently stored and available for review in the ultrasound unit.  Impression: Technically successful ultrasound guided injection.  Procedure: Real-time Ultrasound Guided Injection of left median nerve hydrodissection Device: GE Logiq E  Verbal informed consent obtained.  Time-out conducted.  Noted no overlying erythema, induration, or other signs of local infection.  Skin prepped in a sterile fashion.  Local anesthesia: Topical Ethyl chloride.  With sterile technique and under real time ultrasound guidance:  Using a 25-gauge needle and injected medication both superficial to and deep to the median nerve in the carpal tunnel, a total of 1 mL kenalog 40, 4 mL lidocaine was used. Completed without difficulty  Pain immediately resolved suggesting accurate placement of the medication.  Advised to call if fevers/chills, erythema, induration, drainage, or persistent bleeding.  Images permanently stored and available for review in the ultrasound unit.  Impression: Technically successful ultrasound guided injection.  Impression and Recommendations:

## 2015-07-05 NOTE — Telephone Encounter (Signed)
Received the following information from the OV benefits investigation:  [place] is no longer considered preferred products with [employer] as of 04-01-14. To obtain approval for Orthovisc we recommend submitting a pre-cert along with a letter of medical necessity to [facility] by calling [phone] or faxing to [phone]. This is a Chief of Staff [employer] Fully-Funded Plan. The effective date is 04/02/2015 . V-2919 is covered at 80% and 20610 is covered at 80% of the contracted rate when performed in an office setting. A copay of $45 will apply with or without an office visit being billed. Once out of pocket is met coverage will be at 100% copay's will no longer apply. The reference number is [id number]  Will route to ordering Provider to see if another injection would be appropriate or if letter of medical necessity will be written.

## 2015-07-06 MED ORDER — SODIUM HYALURONATE (VISCOSUP) 20 MG/2ML IX SOSY: 1.0000 | PREFILLED_SYRINGE | INTRA_ARTICULAR | Status: DC

## 2015-07-06 NOTE — Telephone Encounter (Signed)
Unsure what is preferred. We can try Supartz or Euflexxa. Will need to go to speciality pharmacy ([facility]).

## 2015-07-06 NOTE — Telephone Encounter (Signed)
I am going to send in Euflexxa, #3 shots for the right knee

## 2015-07-06 NOTE — Addendum Note (Signed)
Addended by: Silverio Decamp on: 07/06/2015 10:22 AM   Modules accepted: Orders

## 2015-07-06 NOTE — Telephone Encounter (Signed)
Is there a different preferred agent?

## 2015-07-12 ENCOUNTER — Ambulatory Visit
Admission: RE | Admit: 2015-07-12 | Discharge: 2015-07-12 | Disposition: A | Discharge status: Home / Self Care | Payer: Commercial Managed Care - HMO | Source: Ambulatory Visit | Attending: Sports Medicine | Admitting: Sports Medicine

## 2015-07-12 DIAGNOSIS — M47817 Spondylosis without myelopathy or radiculopathy, lumbosacral region: Secondary | ICD-10-CM | POA: Diagnosis not present

## 2015-07-12 DIAGNOSIS — M5416 Radiculopathy, lumbar region: Secondary | ICD-10-CM

## 2015-07-12 MED ORDER — IOHEXOL 180 MG/ML  SOLN
1.0000 mL | Freq: Once | INTRAMUSCULAR | Status: AC | PRN
  Administered 2015-07-12: 1 mL via INTRA_ARTICULAR

## 2015-07-12 MED ORDER — METHYLPREDNISOLONE ACETATE 40 MG/ML INJ SUSP (RADIOLOG
120.0000 mg | Freq: Once | INTRAMUSCULAR | Status: AC
  Administered 2015-07-12: 120 mg via INTRA_ARTICULAR

## 2015-07-13 NOTE — Telephone Encounter (Signed)
Received Euflexxa injections in office today from mail order pharmacy. Attempted to contact Pt to schedule, no answer. Left information and callback on VM to get Pt set up.

## 2015-07-13 NOTE — Telephone Encounter (Signed)
Done

## 2015-07-13 NOTE — Telephone Encounter (Signed)
Thanks for documenting that we got them, tremendously helpful for when he calls back!  We can hold on to them until the patient schedules.  Please make sure they have his name on them and are in the drawer that we keep the [facility].

## 2015-07-21 ENCOUNTER — Encounter: Payer: Self-pay | Admitting: Sports Medicine

## 2015-07-21 ENCOUNTER — Ambulatory Visit (INDEPENDENT_AMBULATORY_CARE_PROVIDER_SITE_OTHER): Payer: Commercial Managed Care - HMO | Admitting: Sports Medicine

## 2015-07-21 VITALS — BP 133/71 | HR 74 | Resp 18 | Wt 266.4 lb

## 2015-07-21 DIAGNOSIS — M5416 Radiculopathy, lumbar region: Secondary | ICD-10-CM

## 2015-07-21 DIAGNOSIS — M5417 Radiculopathy, lumbosacral region: Secondary | ICD-10-CM | POA: Diagnosis not present

## 2015-07-21 DIAGNOSIS — M1711 Unilateral primary osteoarthritis, right knee: Secondary | ICD-10-CM | POA: Diagnosis not present

## 2015-07-21 NOTE — Assessment & Plan Note (Signed)
Good response to right L5-S1 interlaminar epidural, he did not respond to his facet injections. Repeating right L5-S1 interlaminar epidural.

## 2015-07-21 NOTE — Addendum Note (Signed)
Addended by: Silverio Decamp on: 07/21/2015 04:15 PM   Modules accepted: Level of Service

## 2015-07-21 NOTE — Assessment & Plan Note (Signed)
Euflexxa injection today. Return in one week for #2 of 3

## 2015-07-21 NOTE — Addendum Note (Signed)
Addended by: Silverio Decamp on: 07/21/2015 03:59 PM   Modules accepted: Orders

## 2015-07-21 NOTE — Progress Notes (Addendum)
  Subjective:    CC: follow-up  HPI: Lumbar spondylosis: Good response to his right L5-S1 interlaminar epidural, didn't really have much response to his facet joint injections and desires repeat epidural.  Primary osteoarthritis of right knee: Here for Euflexxa injection #1  Past medical history, Surgical history, Family history not pertinant except as noted below, Social history, Allergies, and medications have been entered into the medical record, reviewed, and no changes needed.   Review of Systems: No fevers, chills, night sweats, weight loss, chest pain, or shortness of breath.   Objective:    General: Well Developed, well nourished, and in no acute distress.  Neuro: Alert and oriented x3, extra-ocular muscles intact, sensation grossly intact. Cranial nerves II through XII are intact, motor, sensory, and coordinative functions are all intact. HEENT: Normocephalic, atraumatic, pupils equal round reactive to light, neck supple, no masses, no lymphadenopathy, thyroid nonpalpable.  Skin: Warm and dry, no rashes. Cardiac: Regular rate and rhythm, no murmurs rubs or gallops, no lower extremity edema.  Respiratory: Clear to auscultation bilaterally. Not using accessory muscles, speaking in full sentences.  Procedure: Real-time Ultrasound Guided Injection of right knee Device: GE Logiq E  Verbal informed consent obtained.  Time-out conducted.  Noted no overlying erythema, induration, or other signs of local infection.  Skin prepped in a sterile fashion.  Local anesthesia: Topical Ethyl chloride.  With sterile technique and under real time ultrasound guidance:  Euflexxa injected into the right knee. Completed without difficulty  Pain immediately resolved suggesting accurate placement of the medication.  Advised to call if fevers/chills, erythema, induration, drainage, or persistent bleeding.  Images permanently stored and available for review in the ultrasound unit.  Impression:  Technically successful ultrasound guided injection.  Impression and Recommendations:

## 2015-07-28 ENCOUNTER — Ambulatory Visit (INDEPENDENT_AMBULATORY_CARE_PROVIDER_SITE_OTHER): Payer: Commercial Managed Care - HMO | Admitting: Sports Medicine

## 2015-07-28 ENCOUNTER — Encounter: Payer: Self-pay | Admitting: Sports Medicine

## 2015-07-28 VITALS — BP 143/76 | HR 64 | Resp 18 | Wt 268.0 lb

## 2015-07-28 DIAGNOSIS — M1711 Unilateral primary osteoarthritis, right knee: Secondary | ICD-10-CM

## 2015-07-28 DIAGNOSIS — R221 Localized swelling, mass and lump, neck: Secondary | ICD-10-CM | POA: Diagnosis not present

## 2015-07-28 NOTE — Assessment & Plan Note (Signed)
Euflexxa injection #2 into the right knee, return in one week for #3, starting to feel some response.

## 2015-07-28 NOTE — Progress Notes (Signed)
  Subjective:    CC: follow-up  HPI: Right knee arthritis: Doing better after Euflexxa No. 1, here for #2 into the right knee.  Neck mass: Present for years, unchanged, no tenderness, has never had abnormalities on his thyroid function tests, wonders what this may be.  Lumbar Radiculopathy: Has not yet had his epidural.  Past medical history, Surgical history, Family history not pertinant except as noted below, Social history, Allergies, and medications have been entered into the medical record, reviewed, and no changes needed.   Review of Systems: No fevers, chills, night sweats, weight loss, chest pain, or shortness of breath.   Objective:    General: Well Developed, well nourished, and in no acute distress.  Neuro: Alert and oriented x3, extra-ocular muscles intact, sensation grossly intact.  HEENT: Normocephalic, atraumatic, pupils equal round reactive to light, Mild neck fullness above the manubrium, nontender, no masses, no lymphadenopathy, thyroid nonpalpable.  Skin: Warm and dry, no rashes. Cardiac: Regular rate and rhythm, no murmurs rubs or gallops, no lower extremity edema.  Respiratory: Clear to auscultation bilaterally. Not using accessory muscles, speaking in full sentences.  Procedure: Real-time Ultrasound Guided Injection of right knee Device: GE Logiq E  Verbal informed consent obtained.  Time-out conducted.  Noted no overlying erythema, induration, or other signs of local infection.  Skin prepped in a sterile fashion.  Local anesthesia: Topical Ethyl chloride.  With sterile technique and under real time ultrasound guidance:  Euflexxa Completed without difficulty  Pain immediately resolved suggesting accurate placement of the medication.  Advised to call if fevers/chills, erythema, induration, drainage, or persistent bleeding.  Images permanently stored and available for review in the ultrasound unit.  Impression: Technically successful ultrasound guided  injection.  Impression and Recommendations:    I spent 25 minutes with this patient, greater than 50% was face-to-face time counseling regarding the above diagnoses

## 2015-07-28 NOTE — Assessment & Plan Note (Signed)
Most likely represents adiposity, checking thyroid function tests and ordering soft tissue ultrasound.

## 2015-08-01 ENCOUNTER — Ambulatory Visit: Payer: Commercial Managed Care - HMO | Admitting: Sports Medicine

## 2015-08-02 ENCOUNTER — Ambulatory Visit (INDEPENDENT_AMBULATORY_CARE_PROVIDER_SITE_OTHER): Payer: Commercial Managed Care - HMO

## 2015-08-02 DIAGNOSIS — E042 Nontoxic multinodular goiter: Secondary | ICD-10-CM | POA: Diagnosis not present

## 2015-08-02 DIAGNOSIS — R221 Localized swelling, mass and lump, neck: Secondary | ICD-10-CM

## 2015-08-02 DIAGNOSIS — Z1329 Encounter for screening for other suspected endocrine disorder: Secondary | ICD-10-CM | POA: Diagnosis not present

## 2015-08-03 LAB — T3, FREE: T3, Free: 3.1 pg/mL (ref 2.3–4.2)

## 2015-08-03 LAB — T4, FREE: Free T4: 1.1 ng/dL (ref 0.8–1.8)

## 2015-08-03 LAB — TSH: TSH: 1.92 m[IU]/L (ref 0.40–4.50)

## 2015-08-04 ENCOUNTER — Ambulatory Visit (INDEPENDENT_AMBULATORY_CARE_PROVIDER_SITE_OTHER): Payer: Commercial Managed Care - HMO | Admitting: Sports Medicine

## 2015-08-04 ENCOUNTER — Encounter: Payer: Self-pay | Admitting: Sports Medicine

## 2015-08-04 VITALS — BP 134/78 | HR 66 | Resp 18 | Wt 271.3 lb

## 2015-08-04 DIAGNOSIS — M1711 Unilateral primary osteoarthritis, right knee: Secondary | ICD-10-CM

## 2015-08-04 NOTE — Assessment & Plan Note (Signed)
Euflexxa injection #3 of 3 into the right knee, return in one month to evaluate response.

## 2015-08-04 NOTE — Progress Notes (Signed)
   Procedure: Real-time Ultrasound Guided Injection of right knee Device: GE Logiq E  Verbal informed consent obtained.  Time-out conducted.  Noted no overlying erythema, induration, or other signs of local infection.  Skin prepped in a sterile fashion.  Local anesthesia: Topical Ethyl chloride.  With sterile technique and under real time ultrasound guidance:  Using 18-gauge needle aspirated 6 mL of straw-colored fluid, syringe switched and Euflexxa injected easily Completed without difficulty  Pain immediately resolved suggesting accurate placement of the medication.  Advised to call if fevers/chills, erythema, induration, drainage, or persistent bleeding.  Images permanently stored and available for review in the ultrasound unit.  Impression: Technically successful ultrasound guided injection.  Impression and Recommendations:

## 2015-08-22 ENCOUNTER — Ambulatory Visit
Admission: RE | Admit: 2015-08-22 | Discharge: 2015-08-22 | Disposition: A | Discharge status: Home / Self Care | Payer: Commercial Managed Care - HMO | Source: Ambulatory Visit | Attending: Sports Medicine | Admitting: Sports Medicine

## 2015-08-22 DIAGNOSIS — M47817 Spondylosis without myelopathy or radiculopathy, lumbosacral region: Secondary | ICD-10-CM | POA: Diagnosis not present

## 2015-08-22 MED ORDER — IOPAMIDOL (ISOVUE-M 200) INJECTION 41%
1.0000 mL | Freq: Once | INTRAMUSCULAR | Status: AC
  Administered 2015-08-22: 1 mL via EPIDURAL

## 2015-08-22 MED ORDER — METHYLPREDNISOLONE ACETATE 40 MG/ML INJ SUSP (RADIOLOG
120.0000 mg | Freq: Once | INTRAMUSCULAR | Status: AC
  Administered 2015-08-22: 120 mg via EPIDURAL

## 2015-09-01 ENCOUNTER — Ambulatory Visit (INDEPENDENT_AMBULATORY_CARE_PROVIDER_SITE_OTHER): Payer: Commercial Managed Care - HMO | Admitting: Sports Medicine

## 2015-09-01 ENCOUNTER — Encounter: Payer: Self-pay | Admitting: Sports Medicine

## 2015-09-01 VITALS — BP 167/72 | HR 87 | Resp 18 | Wt 269.9 lb

## 2015-09-01 DIAGNOSIS — M1711 Unilateral primary osteoarthritis, right knee: Secondary | ICD-10-CM | POA: Diagnosis not present

## 2015-09-01 DIAGNOSIS — H04551 Acquired stenosis of right nasolacrimal duct: Secondary | ICD-10-CM

## 2015-09-01 NOTE — Assessment & Plan Note (Signed)
Patient plans to see an ophthalmologic, he will let me know who he would like me to refer him to.

## 2015-09-01 NOTE — Assessment & Plan Note (Signed)
Overall doing well post Euflexxa series, pain-free, occasional catching but non-painful. He will simply work on meniscal rehabilitation exercises for now.

## 2015-09-01 NOTE — Progress Notes (Signed)
  Subjective:    CC: Follow-up  HPI: Right knee osteoarthritis: Doing well post Euflexxa series, pain-free, occasional catching but not painful, he agrees to [place] work on meniscal rehabilitation exercises for now.  Dacryostenosis: Right-sided, diagnosed by an outside ophthalmologist, needs a referral to a local ophthalmologist.  Past medical history, Surgical history, Family history not pertinant except as noted below, Social history, Allergies, and medications have been entered into the medical record, reviewed, and no changes needed.   Review of Systems: No fevers, chills, night sweats, weight loss, chest pain, or shortness of breath.   Objective:    General: Well Developed, well nourished, and in no acute distress.  Neuro: Alert and oriented x3, extra-ocular muscles intact, sensation grossly intact.  HEENT: Normocephalic, atraumatic, pupils equal round reactive to light, neck supple, no masses, no lymphadenopathy, thyroid nonpalpable.  Skin: Warm and dry, no rashes. Cardiac: Regular rate and rhythm, no murmurs rubs or gallops, no lower extremity edema.  Respiratory: Clear to auscultation bilaterally. Not using accessory muscles, speaking in full sentences.  Impression and Recommendations:   I spent 25 minutes with this patient, greater than 50% was face-to-face time counseling regarding the above diagnoses

## 2016-03-06 DIAGNOSIS — H52223 Regular astigmatism, bilateral: Secondary | ICD-10-CM | POA: Diagnosis not present

## 2016-03-20 DIAGNOSIS — Z0100 Encounter for examination of eyes and vision without abnormal findings: Secondary | ICD-10-CM | POA: Diagnosis not present

## 2016-05-16 ENCOUNTER — Ambulatory Visit (INDEPENDENT_AMBULATORY_CARE_PROVIDER_SITE_OTHER): Payer: Commercial Managed Care - HMO | Admitting: Sports Medicine

## 2016-05-16 VITALS — BP 151/80 | HR 63 | Resp 18 | Wt 274.4 lb

## 2016-05-16 DIAGNOSIS — F329 Major depressive disorder, single episode, unspecified: Secondary | ICD-10-CM

## 2016-05-16 DIAGNOSIS — K219 Gastro-esophageal reflux disease without esophagitis: Secondary | ICD-10-CM

## 2016-05-16 DIAGNOSIS — M5416 Radiculopathy, lumbar region: Secondary | ICD-10-CM

## 2016-05-16 DIAGNOSIS — Z23 Encounter for immunization: Secondary | ICD-10-CM | POA: Diagnosis not present

## 2016-05-16 DIAGNOSIS — E669 Obesity, unspecified: Secondary | ICD-10-CM

## 2016-05-16 DIAGNOSIS — R5383 Other fatigue: Secondary | ICD-10-CM | POA: Diagnosis not present

## 2016-05-16 DIAGNOSIS — Z0000 Encounter for general adult medical examination without abnormal findings: Secondary | ICD-10-CM

## 2016-05-16 DIAGNOSIS — R221 Localized swelling, mass and lump, neck: Secondary | ICD-10-CM | POA: Diagnosis not present

## 2016-05-16 DIAGNOSIS — I10 Essential (primary) hypertension: Secondary | ICD-10-CM | POA: Diagnosis not present

## 2016-05-16 DIAGNOSIS — Z131 Encounter for screening for diabetes mellitus: Secondary | ICD-10-CM | POA: Diagnosis not present

## 2016-05-16 DIAGNOSIS — E7800 Pure hypercholesterolemia, unspecified: Secondary | ICD-10-CM | POA: Diagnosis not present

## 2016-05-16 LAB — COMPREHENSIVE METABOLIC PANEL WITH GFR
Albumin: 4.1 g/dL (ref 3.6–5.1)
CO2: 23 mmol/L (ref 20–31)
Chloride: 104 mmol/L (ref 98–110)
Potassium: 4.2 mmol/L (ref 3.5–5.3)
Total Protein: 6.9 g/dL (ref 6.1–8.1)

## 2016-05-16 LAB — COMPREHENSIVE METABOLIC PANEL
ALT: 40 U/L (ref 9–46)
AST: 27 U/L (ref 10–35)
Alkaline Phosphatase: 73 U/L (ref 40–115)
BUN: 16 mg/dL (ref 7–25)
Calcium: 9.2 mg/dL (ref 8.6–10.3)
Creat: 1.22 mg/dL (ref 0.70–1.25)
Glucose, Bld: 79 mg/dL (ref 65–99)
Sodium: 139 mmol/L (ref 135–146)
Total Bilirubin: 0.7 mg/dL (ref 0.2–1.2)

## 2016-05-16 LAB — CBC
HCT: 49.5 % (ref 38.5–50.0)
Hemoglobin: 16.6 g/dL (ref 13.2–17.1)
MCH: 30.1 pg (ref 27.0–33.0)
MCHC: 33.5 g/dL (ref 32.0–36.0)
MCV: 89.8 fL (ref 80.0–100.0)
MPV: 10.5 fL (ref 7.5–12.5)
Platelets: 209 K/uL (ref 140–400)
RBC: 5.51 MIL/uL (ref 4.20–5.80)
RDW: 14.6 % (ref 11.0–15.0)
WBC: 5.4 10*3/uL (ref 3.8–10.8)

## 2016-05-16 LAB — LIPID PANEL W/REFLEX DIRECT LDL
Cholesterol: 240 mg/dL — ABNORMAL HIGH (ref <200)
HDL: 47 mg/dL (ref >40)
LDL-Cholesterol: 166 mg/dL — ABNORMAL HIGH
Non-HDL Cholesterol (Calc): 193 mg/dL — ABNORMAL HIGH (ref <130)
Total CHOL/HDL Ratio: 5.1 Ratio — ABNORMAL HIGH (ref <5.0)
Triglycerides: 133 mg/dL (ref <150)

## 2016-05-16 LAB — TSH: TSH: 2.28 mIU/L (ref 0.40–4.50)

## 2016-05-16 MED ORDER — PANTOPRAZOLE SODIUM 40 MG PO TBEC: 40.0000 mg | DELAYED_RELEASE_TABLET | Freq: Every day | ORAL | 3 refills | Status: DC

## 2016-05-16 MED ORDER — VALSARTAN 160 MG PO TABS: 160.0000 mg | ORAL_TABLET | Freq: Every day | ORAL | 3 refills | Status: DC

## 2016-05-16 MED ORDER — BUPROPION HCL ER (XL) 150 MG PO TB24: 150.0000 mg | ORAL_TABLET | Freq: Every day | ORAL | 3 refills | Status: DC

## 2016-05-16 NOTE — Addendum Note (Signed)
Addended by: Elizabeth Sauer on: 05/16/2016 02:17 PM   Modules accepted: Orders

## 2016-05-16 NOTE — Assessment & Plan Note (Signed)
Adding Wellbutrin 150, recheck PHQ9 in one month

## 2016-05-16 NOTE — Assessment & Plan Note (Signed)
Routine blood work, hepatitis C screening. Referral for colonoscopy. Vaccines given.

## 2016-05-16 NOTE — Assessment & Plan Note (Signed)
Worst epidural did well, facet injections and second epidural did not. Aggressive physical therapy.

## 2016-05-16 NOTE — Assessment & Plan Note (Signed)
Has self discontinued medication, rechecking lipids

## 2016-05-16 NOTE — Assessment & Plan Note (Signed)
Starting moderate dose valsartan.

## 2016-05-16 NOTE — Assessment & Plan Note (Signed)
Ultrasound showed a nodule in the thyroid that did not meet criteria for biopsy. We will monitor thyroid function.

## 2016-05-16 NOTE — Assessment & Plan Note (Addendum)
Excessive daytime sleepiness, difficult to control hypertension. Unspecified likely due to some degree of depression, we are also going to evaluate for sleep apnea. Considering high PHQ9 I am going to add Wellbutrin 150, this will also help with weight loss.

## 2016-05-16 NOTE — Assessment & Plan Note (Signed)
Starting pantoprazole, referral to GI, he is also in need of a colonoscopy.

## 2016-05-16 NOTE — Progress Notes (Signed)
  Subjective:    CC: Many, many, many issues.  HPI: Hypertension: Has stopped all medications, blood pressures not surprisingly elevated, no headaches, visual changes, chest pain.  Fatigue: Nonspecific. He does report severe depressive symptoms. Also has trouble sleeping.  Hyperlipidemia: Self discontinued medication.  Lumbar radiculopathy: Did respond to initial epidurals, did not respond to subsequent facet injections or repeat epidural. His agreeable proceed with another few sessions of physical therapy.  Epigastric and GERD type symptoms: With a sour brash in the morning, hoarseness, agrees to try medication.  Preventive measures: Due for colonoscopy, blood work, vaccines.  Past medical history:  Negative.  See flowsheet/record as well for more information.  Surgical history: Negative.  See flowsheet/record as well for more information.  Family history: Negative.  See flowsheet/record as well for more information.  Social history: Negative.  See flowsheet/record as well for more information.  Allergies, and medications have been entered into the medical record, reviewed, and no changes needed.   Review of Systems: No fevers, chills, night sweats, weight loss, chest pain, or shortness of breath.   Objective:    General: Well Developed, well nourished, and in no acute distress.  Neuro: Alert and oriented x3, extra-ocular muscles intact, sensation grossly intact.  HEENT: Normocephalic, atraumatic, pupils equal round reactive to light, neck supple, no masses, no lymphadenopathy, thyroid nonpalpable.  Skin: Warm and dry, no rashes. Cardiac: Regular rate and rhythm, no murmurs rubs or gallops, no lower extremity edema.  Respiratory: Clear to auscultation bilaterally. Not using accessory muscles, speaking in full sentences.  Impression and Recommendations:    Essential hypertension Starting moderate dose valsartan.  Hyperlipidemia Has self discontinued medication, rechecking  lipids  Neck mass Ultrasound showed a nodule in the thyroid that did not meet criteria for biopsy. We will monitor thyroid function.  Right lumbar radiculitis Worst epidural did well, facet injections and second epidural did not. Aggressive physical therapy.  Fatigue Excessive daytime sleepiness, difficult to control hypertension. Unspecified likely due to some degree of depression, we are also going to evaluate for sleep apnea.  GERD (gastroesophageal reflux disease) Starting pantoprazole, referral to GI, he is also in need of a colonoscopy.  Annual physical exam Routine blood work, hepatitis C screening. Referral for colonoscopy. Vaccines given.  I spent 40 minutes with this patient, greater than 50% was face-to-face time counseling regarding the above diagnoses

## 2016-05-17 LAB — TESTOSTERONE TOTAL,FREE,BIO, MALES
Albumin: 4.1 g/dL (ref 3.6–5.1)
Sex Hormone Binding: 31 nmol/L (ref 22–77)
Testosterone, Bioavailable: 120.7 ng/dL (ref 110.0–575.0)
Testosterone, Free: 64.1 pg/mL (ref 46.0–224.0)
Testosterone: 446 ng/dL (ref 250–827)

## 2016-05-17 LAB — HEMOGLOBIN A1C
Hgb A1c MFr Bld: 5.9 % — ABNORMAL HIGH (ref <5.7)
Mean Plasma Glucose: 123 mg/dL

## 2016-05-17 LAB — HEPATITIS C ANTIBODY: HCV Ab: NEGATIVE

## 2016-05-20 DIAGNOSIS — R112 Nausea with vomiting, unspecified: Secondary | ICD-10-CM | POA: Diagnosis not present

## 2016-05-20 DIAGNOSIS — K219 Gastro-esophageal reflux disease without esophagitis: Secondary | ICD-10-CM | POA: Diagnosis not present

## 2016-05-20 DIAGNOSIS — Z1211 Encounter for screening for malignant neoplasm of colon: Secondary | ICD-10-CM | POA: Diagnosis not present

## 2016-05-20 DIAGNOSIS — R1110 Vomiting, unspecified: Secondary | ICD-10-CM | POA: Diagnosis not present

## 2016-05-30 ENCOUNTER — Encounter: Payer: Self-pay | Admitting: Rehabilitative and Restorative Service Providers"

## 2016-05-30 ENCOUNTER — Ambulatory Visit (INDEPENDENT_AMBULATORY_CARE_PROVIDER_SITE_OTHER): Payer: Medicare HMO | Admitting: Rehabilitative and Restorative Service Providers"

## 2016-05-30 DIAGNOSIS — R293 Abnormal posture: Secondary | ICD-10-CM | POA: Diagnosis not present

## 2016-05-30 DIAGNOSIS — M5416 Radiculopathy, lumbar region: Secondary | ICD-10-CM | POA: Diagnosis not present

## 2016-05-30 DIAGNOSIS — M6281 Muscle weakness (generalized): Secondary | ICD-10-CM

## 2016-05-30 NOTE — Therapy (Signed)
[facility] [place] [place], [place], [address] Phone: [phone]   Fax:  [phone]  Physical Therapy Evaluation  Patient Details  Name: Robert Guerrero MRN: [id number] Date of Birth: 12/05/1950 Referring Provider: Dr Dianah Field   Encounter Date: 05/30/2016      PT End of Session - 05/30/16 0846    Visit Number 1   Number of Visits 12   Date for PT Re-Evaluation 07/11/16   PT Start Time 0846   PT Stop Time 0949   PT Time Calculation (min) 63 min   Activity Tolerance Patient tolerated treatment well      Past Medical History:  Diagnosis Date  . Hyperlipidemia   . Hypertension   . Osteoarthritis of right knee   . Right lumbar radiculitis   . SVT (supraventricular tachycardia) ([place])     History reviewed. No pertinent surgical history.  There were no vitals filed for this visit.       Subjective Assessment - 05/30/16 0848    Subjective Patient reports that he has had LBP for many years. MRI from 2 years ago showed HNP L5. Tried injections with only temporary relief. He notices pain in the back into the buttocks into the Rt LE toward the knee. He has had increased pain for the past several months, especially since 6/17. He finds activity very limited and would like to be looser and more active.    Pertinent History Rt knee torn meniscus treated with injections about 6 months agoRt shoulder pain    How long can you sit comfortably? 15-20 min    How long can you stand comfortably? 15-20 min    How long can you walk comfortably? 15-20 min    Diagnostic tests xrays; MRI   Patient Stated Goals eliminate cause of discomfort in the back and get rid of the tightness in the back    Currently in Pain? Yes   Pain Score 5    Pain Location Back   Pain Orientation Lower;Right   Pain Descriptors / Indicators Tightness   Pain Type Chronic pain   Pain Radiating Towards Rt buttock into the knee    Pain Onset More than a  month ago   Pain Frequency Constant   Aggravating Factors  work activities; prolonged standing or sitting   Pain Relieving Factors elevatioin of legs; stretching across the large exercise ball; hanging from bar in his garden shed             [facility] PT Assessment - 05/30/16 0001      Assessment   Medical Diagnosis Rt lumbar radiculopathy    Referring Provider Dr Dianah Field    Onset Date/Surgical Date 08/31/15   Hand Dominance Right   Next MD Visit 06/13/16   Prior Therapy yes for back; knee; shoulder      Precautions   Precautions None     Balance Screen   Has the patient fallen in the past 6 months No   Has the patient had a decrease in activity level because of a fear of falling?  No   Is the patient reluctant to leave their home because of a fear of falling?  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; lifting; bending; reaching ~ 8 hr/day 5 days/wk  x 26 years    Leisure yard work seasonally; TV in Psychologist, occupational  Observation/Other Assessments   Focus on Therapeutic Outcomes (FOTO)  44% limitation      Sensation   Additional Comments WNL's      Posture/Postural Control   Posture Comments head forward; shoulders rounded and elevated; increased thoracic kyphosis; flexed forward at hips; wt shifted to the Lt in standing      AROM   Overall AROM Comments end range tightness bilat hips Rt > Lt    AROM Assessment Site --  stiff and tight with all lumbar motions   Lumbar Flexion 65%  pain returning to stand   Lumbar Extension 5%  stiff and tight/discomfort   Lumbar - Right Side Bend 50%   Lumbar - Left Side Bend 45%   Lumbar - Right Rotation 20%   Lumbar - Left Rotation 25%     Strength   Right Hip Extension 4/5   Right Hip ABduction 4+/5   Left Hip Extension 4/5     Flexibility   Hamstrings tight bilat ~75-80 deg   Quadriceps tight ~ 100 deg bialt    ITB  tight Rt > Lt    Piriformis tight Rt >>>Lt      Palpation   Spinal mobility no pain with CPA mobs    Palpation comment tight/tender Rt posterior hip/piriformis/gluteals      Special Tests    Special Tests --  (-) SLRLendon Collar                    [facility] Adult PT Treatment/Exercise - 05/30/16 0001      Self-Care   Self-Care --  back education importance of stretching      Lumbar Exercises: Stretches   Passive Hamstring Stretch 2 reps;30 seconds  supine with strap    Hip Flexor Stretch 2 reps;60 seconds  supine both knees to chest lowering Rt LE off table    Press Ups --  2-3 sec pause x 10 to pat tolerance    Quad Stretch 2 reps;30 seconds  prone with strap   ITB Stretch 2 reps;30 seconds  supine with strap    Piriformis Stretch 2 reps;30 seconds  supine with strap - travell      Moist Heat Therapy   Number Minutes Moist Heat 15 Minutes   Moist Heat Location Lumbar Spine;Hip  Rt     Electrical Stimulation   Electrical Stimulation Location lumbar; Rt hip/piriformis    Electrical Stimulation Action IFC   Electrical Stimulation Parameters to tolerance   Electrical Stimulation Goals Tone;Pain                PT Education - 05/30/16 [phone]    Education provided Yes   Education Details HEP back care handout    Person(s) Educated Patient   Methods Explanation;Demonstration;Tactile cues;Verbal cues;Handout   Comprehension Verbalized understanding;Returned demonstration;Verbal cues required;Tactile cues required             PT Long Term Goals - 05/30/16 1251      PT LONG TERM GOAL #1   Title Improve posture and alignment with standing and walking with patient to demonstrate increased hip extension 07/11/16   Time 6   Period Weeks   Status New     PT LONG TERM GOAL #2   Title Increase mobility and ROM through bilat hips with patient to demonstrate/report increased movement in LE's for functional activities such as dressing and putting on shoes  07/11/16   Time 6   Period Weeks   Status New  PT LONG TERM GOAL #3   Title Increase strength bilat hips to 5-/5 to 5/5 07/11/16   Time 6   Period Weeks   Status New     PT LONG TERM GOAL #4   Title Patient tolerates sitting and standing for 30-45 min with minimal to no increase in pain 07/11/16   Time 6   Period Weeks   Status New     PT LONG TERM GOAL #5   Title Independent in HEP 07/11/16   Time 6   Period Weeks   Status New     PT LONG TERM GOAL #6   Title Improve FOTO to </= 39% limitation 07/11/16   Time 6   Period Weeks   Status New               Plan - 05/30/16 1015    Clinical Impression Statement Robert Guerrero presents with progressively worsening Rt sided LBP with pain into the Rt buttocks and toward the knee. He has a history of longstanding LBP present over several years. He has poor posture with forward flexed trunk at hips; limited trunk and LE mobility and ROM; core weakness; generalized weakness through the hip extensors and Rt hip abductors; muscular tightness to palpation; poor movement patterns; pain and discomfort on a daily basis interferring with functional activities and ADL's. Patient will benefit from PT to address problems identified.    Rehab Potential Good   PT Frequency 2x / week   PT Duration 6 weeks   PT Treatment/Interventions Patient/family education;ADLs/Self Care Home Management;Neuromuscular re-education;Cryotherapy;Electrical Stimulation;Iontophoresis 4mg /ml Dexamethasone;Moist Heat;Traction;Ultrasound;Dry needling;Manual techniques;Therapeutic activities;Therapeutic exercise   PT Next Visit Plan review exercises; progress with back education and proper body mechanics; stretching; core strengthening; manual work; modalities as indicated   Oncologist with Plan of Care Patient      Patient will benefit from skilled therapeutic intervention in order to improve the following deficits and impairments:  Postural dysfunction, Improper  body mechanics, Pain, Decreased range of motion, Decreased mobility, Decreased strength, Increased muscle spasms, Increased fascial restricitons, Decreased activity tolerance  Visit Diagnosis: Radiculopathy, lumbar region - Plan: PT plan of care cert/re-cert  Abnormal posture - Plan: PT plan of care cert/re-cert  Muscle weakness (generalized) - Plan: PT plan of care cert/re-cert     Problem List Patient Active Problem List   Diagnosis Date Noted  . Fatigue 05/16/2016  . GERD (gastroesophageal reflux disease) 05/16/2016  . Obesity 05/16/2016  . Annual physical exam 05/16/2016  . Major depression, chronic 05/16/2016  . Dacryostenosis of right nasolacrimal duct 09/01/2015  . Neck mass 07/28/2015  . Bilateral carpal tunnel syndrome 06/05/2015  . SVT (supraventricular tachycardia) ([place]) 07/07/2013  . Essential hypertension 05/19/2013  . Hyperlipidemia 05/19/2013  . Atypical chest pain 03/29/2013  . Right lumbar radiculitis 01/26/2013  . Osteoarthritis of right knee 12/29/2012    Elley Harp Nilda Simmer PT, MPH  05/30/2016, 12:57 PM  [facility] [place], [place], [address] Phone: [phone]   Fax:  [phone]  Name: Robert Guerrero MRN: [id number] Date of Birth: 05-11-1950

## 2016-05-30 NOTE — Patient Instructions (Signed)
Abdominal Bracing With Pelvic Floor (Hook-Lying)    With neutral spine, tighten pelvic floor and abdominals sucking belly button to back bone; tighten muscles in low back at waist. Hold 10 sec Repeat _10 times. Do several___ times a day. Progress to do this in sitting; standing; walking and with functional activities   HIP: Hamstrings - Supine   Place strap around foot. Raise leg up, keeping knee straight.  Bend opposite knee to protect back if indicated. Hold 30 seconds. 3 reps per set, 2-3 sets per day     Outer Hip Stretch: Reclined IT Band Stretch (Strap)   Strap around one foot, pull leg across body until you feel a pull or stretch, with shoulders on mat. Hold for 30 seconds. Repeat 3 times each leg. 2-3 times/day.  Piriformis Stretch   Lying on back, pull right knee toward opposite shoulder. Hold 30 seconds. Repeat 3 times. Do 2-3 sessions per day.   Quads / HF, Supine   Lie near edge of bed, pull both knees up toward chest. Hold one knee as you drop the other leg off the edge of the bed.  Relax hanging knee/can bend knee back if indicated. Hold 30 seconds. Repeat 3 times per session. Do 2-3 sessions per day.    Quads / HF, Prone   Lie face down. Grasp one ankle with same-side hand. Use towel if needed to reach. Gently pull foot toward buttock.  Hold 30 seconds. Repeat 3 times per session. Do 2-3 sessions per day.   Sleeping on Back  Place pillow under knees. A pillow with cervical support and a roll around waist are also helpful. Copyright  VHI. All rights reserved.  Sleeping on Side Place pillow between knees. Use cervical support under neck and a roll around waist as needed. Copyright  VHI. All rights reserved.   Sleeping on Stomach   If this is the only desirable sleeping position, place pillow under lower legs, and under stomach or chest as needed.  Posture - Sitting   Sit upright, head facing forward. Try using a roll to support lower back.  Keep shoulders relaxed, and avoid rounded back. Keep hips level with knees. Avoid crossing legs for long periods. Stand to Sit / Sit to Stand   To sit: Bend knees to lower self onto front edge of chair, then scoot back on seat. To stand: Reverse sequence by placing one foot forward, and scoot to front of seat. Use rocking motion to stand up.   Work Height and Reach  Ideal work height is no more than 2 to 4 inches below elbow level when standing, and at elbow level when sitting. Reaching should be limited to arm's length, with elbows slightly bent.  Bending  Bend at hips and knees, not back. Keep feet shoulder-width apart.    Posture - Standing   Good posture is important. Avoid slouching and forward head thrust. Maintain curve in low back and align ears over shoul- ders, hips over ankles.  Alternating Positions   Alternate tasks and change positions frequently to reduce fatigue and muscle tension. Take rest breaks. Computer Work   Position work to Programmer, multimedia. Use proper work and seat height. Keep shoulders back and down, wrists straight, and elbows at right angles. Use chair that provides full back support. Add footrest and lumbar roll as needed.  Getting Into / Out of Car  Lower self onto seat, scoot back, then bring in one leg at a time. Reverse sequence to get out.  Dressing  Lie on back to pull socks or slacks over feet, or sit and bend leg while keeping back straight.    Housework - Sink  Place one foot on ledge of cabinet under sink when standing at sink for prolonged periods.   Pushing / Pulling  Pushing is preferable to pulling. Keep back in proper alignment, and use leg muscles to do the work.  Deep Squat   Squat and lift with both arms held against upper trunk. Tighten stomach muscles without holding breath. Use smooth movements to avoid jerking.  Avoid Twisting   Avoid twisting or bending back. Pivot around using foot movements, and bend at knees  if needed when reaching for articles.  Carrying Luggage   Distribute weight evenly on both sides. Use a cart whenever possible. Do not twist trunk. Move body as a unit.   Lifting Principles .Maintain proper posture and head alignment. .Slide object as close as possible before lifting. .Move obstacles out of the way. .Test before lifting; ask for help if too heavy. .Tighten stomach muscles without holding breath. .Use smooth movements; do not jerk. .Use legs to do the work, and pivot with feet. .Distribute the work load symmetrically and close to the center of trunk. .Push instead of pull whenever possible.   Ask For Help   Ask for help and delegate to others when possible. Coordinate your movements when lifting together, and maintain the low back curve.  Log Roll   Lying on back, bend left knee and place left arm across chest. Roll all in one movement to the right. Reverse to roll to the left. Always move as one unit. Housework - Sweeping  Use long-handled equipment to avoid stooping.   Housework - Wiping  Position yourself as close as possible to reach work surface. Avoid straining your back.  Laundry - Unloading Wash   To unload small items at bottom of washer, lift leg opposite to arm being used to reach.  [place] close to area to be raked. Use arm movements to do the work. Keep back straight and avoid twisting.     Cart  When reaching into cart with one arm, lift opposite leg to keep back straight.   Getting Into / Out of Bed  Lower self to lie down on one side by raising legs and lowering head at the same time. Use arms to assist moving without twisting. Bend both knees to roll onto back if desired. To sit up, start from lying on side, and use same move-ments in reverse. Housework - Vacuuming  Hold the vacuum with arm held at side. Step back and forth to move it, keeping head up. Avoid twisting.   Laundry - Civil engineer, contracting so that bending and twisting can be avoided.   Laundry - Unloading Dryer  Squat down to reach into clothes dryer or use a reacher.  Gardening - Weeding / Probation officer or Kneel. Knee pads may be helpful.

## 2016-06-04 ENCOUNTER — Ambulatory Visit (INDEPENDENT_AMBULATORY_CARE_PROVIDER_SITE_OTHER): Payer: Medicare HMO | Admitting: Rehabilitative and Restorative Service Providers"

## 2016-06-04 ENCOUNTER — Encounter: Payer: Self-pay | Admitting: Rehabilitative and Restorative Service Providers"

## 2016-06-04 DIAGNOSIS — M6281 Muscle weakness (generalized): Secondary | ICD-10-CM | POA: Diagnosis not present

## 2016-06-04 DIAGNOSIS — M5416 Radiculopathy, lumbar region: Secondary | ICD-10-CM | POA: Diagnosis not present

## 2016-06-04 DIAGNOSIS — R293 Abnormal posture: Secondary | ICD-10-CM | POA: Diagnosis not present

## 2016-06-04 NOTE — Therapy (Signed)
[facility], [place], [address] Phone: [phone]   Fax:  [phone]  Physical Therapy Treatment  Patient Details  Name: Robert Guerrero MRN: [id number] Date of Birth: 1950-04-27 Referring Provider: Dr Dianah Field  Encounter Date: 06/04/2016      PT End of Session - 06/04/16 0805    Visit Number 2   Number of Visits 12   Date for PT Re-Evaluation 07/11/16   PT Start Time 0803   PT Stop Time 0856   PT Time Calculation (min) 53 min   Activity Tolerance Patient tolerated treatment well      Past Medical History:  Diagnosis Date  . Hyperlipidemia   . Hypertension   . Osteoarthritis of right knee   . Right lumbar radiculitis   . SVT (supraventricular tachycardia) ([place])     History reviewed. No pertinent surgical history.  There were no vitals filed for this visit.      Subjective Assessment - 06/04/16 0805    Subjective Patient reports some increased soreness in the hips form HEP. Not so much pain as tightness - feels like something is "caught" in the small of his back.    Currently in Pain? Yes   Pain Score 3    Pain Location Back   Pain Orientation Lower;Right   Pain Descriptors / Indicators Tightness   Pain Type Chronic pain   Pain Onset More than a month ago   Pain Frequency Constant   Aggravating Factors  work activities; prolonged standing and sitting   Pain Relieving Factors elevation of legs; stretching across the large exercise ball; hanging from the bar in his garden shed            [facility] PT Assessment - 06/04/16 0001      Assessment   Medical Diagnosis Rt lumbar radiculopathy    Referring Provider Dr Dianah Field   Onset Date/Surgical Date 08/31/15   Hand Dominance Right   Next MD Visit 06/13/16   Prior Therapy yes for back; knee; shoulder      Flexibility   Soft Tissue Assessment /Muscle Length --  tight hip flexors bilat    Hamstrings tight bilat ~80 deg   Quadriceps tight ~ 100 deg bialt    ITB tight Rt > Lt    Piriformis tight Rt >>>Lt                      [facility] Adult PT Treatment/Exercise - 06/04/16 0001      Therapeutic Activites    Therapeutic Activities --  myofacial ball release work Rt hip post/ant      Lumbar Exercises: Stretches   Passive Hamstring Stretch 3 reps;30 seconds   Hip Flexor Stretch 3 reps;30 seconds   Press Ups --  2-3 sec pause x 10 to pat tolerance    Quad Stretch 2 reps;30 seconds   ITB Stretch 2 reps;30 seconds   Piriformis Stretch 2 reps;30 seconds     Lumbar Exercises: Standing   Other Standing Lumbar Exercises hip ext x 10; hip abduction leading with heel x20 each LE - blue TB      Lumbar Exercises: Prone   Other Prone Lumbar Exercises pelvic press 5 sec x 10      Moist Heat Therapy   Number Minutes Moist Heat 15 Minutes   Moist Heat Location Lumbar Spine;Hip  Rt     Electrical Stimulation   Electrical Stimulation Location lumbar; Rt hip/piriformis    Electrical  Stimulation Action IFC   Electrical Stimulation Parameters to tolerance   Electrical Stimulation Goals Tone;Pain     Manual Therapy   Manual therapy comments pt prone   Joint Mobilization CPA mobs lumbar spine Grade II/III                PT Education - 06/04/16 [id number]    Education provided Yes   Education Details HEP   Person(s) Educated Patient   Methods Explanation;Demonstration;Tactile cues;Verbal cues;Handout   Comprehension Verbalized understanding;Returned demonstration;Verbal cues required;Tactile cues required             PT Long Term Goals - 06/04/16 0809      PT LONG TERM GOAL #1   Title Improve posture and alignment with standing and walking with patient to demonstrate increased hip extension 07/11/16   Time 6   Period Weeks   Status On-going     PT LONG TERM GOAL #2   Title Increase mobility and ROM through bilat hips with patient to demonstrate/report increased movement in LE's for  functional activities such as dressing and putting on shoes 07/11/16   Time 6   Period Weeks   Status On-going     PT LONG TERM GOAL #3   Title Increase strength bilat hips to 5-/5 to 5/5 07/11/16   Time 6   Period Weeks   Status On-going     PT LONG TERM GOAL #4   Title Patient tolerates sitting and standing for 30-45 min with minimal to no increase in pain 07/11/16   Time 6   Period Weeks   Status On-going     PT LONG TERM GOAL #5   Title Independent in HEP 07/11/16   Time 6   Period Weeks   Status On-going     PT LONG TERM GOAL #6   Title Improve FOTO to </= 39% limitation 07/11/16   Time 6   Period Weeks   Status On-going               Plan - 06/04/16 0821    Clinical Impression Statement Robert Guerrero reports continued tightness in the low back and Rt hip area. He has stretched and done some of his exercises but has difficulty with consistently stretching. Continues to demonstrate significant tightness through the lumbar spine and hip flexors. Added new exercises without difficulty. Continues to benefit from PT to address problems identified.    Rehab Potential Good   PT Frequency 2x / week   PT Duration 6 weeks   PT Treatment/Interventions Patient/family education;ADLs/Self Care Home Management;Neuromuscular re-education;Cryotherapy;Electrical Stimulation;Iontophoresis 4mg /ml Dexamethasone;Moist Heat;Traction;Ultrasound;Dry needling;Manual techniques;Therapeutic activities;Therapeutic exercise   PT Next Visit Plan review exercises; progress with back education and proper body mechanics; stretching; core strengthening; manual work; modalities as indicated - work to stretch hip flexors!   Consulted and Agree with Plan of Care Patient      Patient will benefit from skilled therapeutic intervention in order to improve the following deficits and impairments:  Postural dysfunction, Improper body mechanics, Pain, Decreased range of motion, Decreased mobility, Decreased strength,  Increased muscle spasms, Increased fascial restricitons, Decreased activity tolerance  Visit Diagnosis: Radiculopathy, lumbar region  Abnormal posture  Muscle weakness (generalized)     Problem List Patient Active Problem List   Diagnosis Date Noted  . Fatigue 05/16/2016  . GERD (gastroesophageal reflux disease) 05/16/2016  . Obesity 05/16/2016  . Annual physical exam 05/16/2016  . Major depression, chronic 05/16/2016  . Dacryostenosis of right nasolacrimal duct 09/01/2015  . Neck  mass 07/28/2015  . Bilateral carpal tunnel syndrome 06/05/2015  . SVT (supraventricular tachycardia) ([place]) 07/07/2013  . Essential hypertension 05/19/2013  . Hyperlipidemia 05/19/2013  . Atypical chest pain 03/29/2013  . Right lumbar radiculitis 01/26/2013  . Osteoarthritis of right knee 12/29/2012    Robert Guerrero Nilda Simmer PT, MPH  06/04/2016, 8:47 AM  [facility] [place], [place], [address] Phone: [phone]   Fax:  [phone]  Name: Robert Guerrero MRN: [id number] Date of Birth: 07/31/1950

## 2016-06-04 NOTE — Patient Instructions (Addendum)
Pelvic Press    Place hands under belly between navel and pubic bone, palms up. Feel pressure on hands. Increase pressure on hands by pressing pelvis down. This is NOT a pelvic tilt. Hold _5-10__ seconds. Relax. Repeat _10__ times.   Strengthening: Hip Extension - Resisted    With tubing around right ankle, face anchor and pull leg straight back. Repeat _10___ times per set. Do _2-3___ sets per session. Do _1-2___ sessions per day.   Strengthening: Hip Abduction - Resisted    With tubing around right leg, other side toward anchor, extend leg out from side. Repeat __10__ times per set. Do __2-3__ sets per session. Do _1-2___ sessions per day.

## 2016-06-06 ENCOUNTER — Ambulatory Visit (INDEPENDENT_AMBULATORY_CARE_PROVIDER_SITE_OTHER): Payer: Medicare HMO | Admitting: Physical Therapy

## 2016-06-06 DIAGNOSIS — M5416 Radiculopathy, lumbar region: Secondary | ICD-10-CM | POA: Diagnosis not present

## 2016-06-06 DIAGNOSIS — M6281 Muscle weakness (generalized): Secondary | ICD-10-CM | POA: Diagnosis not present

## 2016-06-06 DIAGNOSIS — R293 Abnormal posture: Secondary | ICD-10-CM | POA: Diagnosis not present

## 2016-06-06 NOTE — Therapy (Signed)
[place], [place], [address] Phone: [phone]   Fax:  [phone]  Physical Therapy Treatment  Patient Details  Name: Robert Guerrero MRN: [id number] Date of Birth: July 05, 1950 Referring Provider: Dr. Dianah Field  Encounter Date: 06/06/2016      PT End of Session - 06/06/16 0806    Visit Number 3   Number of Visits 12   Date for PT Re-Evaluation 07/11/16   PT Start Time 0803   PT Stop Time 0901   PT Time Calculation (min) 58 min   Activity Tolerance Patient tolerated treatment well      Past Medical History:  Diagnosis Date  . Hyperlipidemia   . Hypertension   . Osteoarthritis of right knee   . Right lumbar radiculitis   . SVT (supraventricular tachycardia) ([facility])     No past surgical history on file.  There were no vitals filed for this visit.      Subjective Assessment - 06/06/16 0806    Subjective Pt reports he is "On the fence" about therapy, hoping things start turning around soon.  Still complains of pain in his back with reaching over counter at work.    Currently in Pain? No/denies   Pain Score 0-No pain   Pain Location Back            [facility] PT Assessment - 06/06/16 0001      Assessment   Medical Diagnosis Rt lumbar radiculopathy    Referring Provider Dr. Dianah Field   Onset Date/Surgical Date 08/31/15   Hand Dominance Right   Next MD Visit 06/13/16          [facility] Adult PT Treatment/Exercise - 06/06/16 0001      Lumbar Exercises: Stretches   Passive Hamstring Stretch 3 reps;30 seconds   Prone on Elbows Stretch 3 reps;20 seconds   Press Ups --  2-3 sec pause x 10 to pt tolerance    Quad Stretch 30 seconds;3 reps   Piriformis Stretch 2 reps;30 seconds     Lumbar Exercises: Aerobic   Stationary Bike NuStep L5: 5.5 min      Lumbar Exercises: Supine   Ab Set 5 seconds;15 reps   AB Set Limitations required multiple cues to engage    Clam 10 reps;2 seconds   with ab set   Bent Knee Raise 10 reps  with ab set     Lumbar Exercises: Prone   Other Prone Lumbar Exercises pelvic press 5 sec x 5 reps; multiple cues to engage.       Moist Heat Therapy   Number Minutes Moist Heat 15 Minutes   Moist Heat Location Lumbar Spine;Hip  Rt     Electrical Stimulation   Electrical Stimulation Location lumbar; Rt hip/piriformis    Electrical Stimulation Action IFC   Electrical Stimulation Parameters  to tolerance   Electrical Stimulation Goals Tone;Pain                PT Education - 06/06/16 [phone]    Education provided Yes   Education Details HEP   Person(s) Educated Patient   Methods Explanation   Comprehension Verbalized understanding;Returned demonstration             PT Long Term Goals - 06/04/16 0809      PT LONG TERM GOAL #1   Title Improve posture and alignment with standing and walking with patient to demonstrate increased hip extension 07/11/16   Time 6   Period Weeks   Status  On-going     PT LONG TERM GOAL #2   Title Increase mobility and ROM through bilat hips with patient to demonstrate/report increased movement in LE's for functional activities such as dressing and putting on shoes 07/11/16   Time 6   Period Weeks   Status On-going     PT LONG TERM GOAL #3   Title Increase strength bilat hips to 5-/5 to 5/5 07/11/16   Time 6   Period Weeks   Status On-going     PT LONG TERM GOAL #4   Title Patient tolerates sitting and standing for 30-45 min with minimal to no increase in pain 07/11/16   Time 6   Period Weeks   Status On-going     PT LONG TERM GOAL #5   Title Independent in HEP 07/11/16   Time 6   Period Weeks   Status On-going     PT LONG TERM GOAL #6   Title Improve FOTO to </= 39% limitation 07/11/16   Time 6   Period Weeks   Status On-going               Plan - 06/06/16 5409    Clinical Impression Statement Pt required multiple cues for proper engagement of multifidus and TA.  He reported  some discomfort with press ups; noted less trouble with modification to POE. Pt tolerated all other exercises well, with minimal increase in pain.  Pt will benefit from continued PT intervention to maximize function.     Rehab Potential Good   PT Frequency 2x / week   PT Duration 8 weeks   PT Treatment/Interventions Patient/family education;ADLs/Self Care Home Management;Neuromuscular re-education;Cryotherapy;Electrical Stimulation;Iontophoresis 4mg /ml Dexamethasone;Moist Heat;Traction;Ultrasound;Dry needling;Manual techniques;Therapeutic activities;Therapeutic exercise   PT Next Visit Plan Review new TA exercises.  Add hip flexor stretch to HEP. Modalities as indicated.    Consulted and Agree with Plan of Care Patient      Patient will benefit from skilled therapeutic intervention in order to improve the following deficits and impairments:  Postural dysfunction, Improper body mechanics, Pain, Decreased range of motion, Decreased mobility, Decreased strength, Increased muscle spasms, Increased fascial restricitons, Decreased activity tolerance  Visit Diagnosis: Radiculopathy, lumbar region  Abnormal posture  Muscle weakness (generalized)     Problem List Patient Active Problem List   Diagnosis Date Noted  . Fatigue 05/16/2016  . GERD (gastroesophageal reflux disease) 05/16/2016  . Obesity 05/16/2016  . Annual physical exam 05/16/2016  . Major depression, chronic 05/16/2016  . Dacryostenosis of right nasolacrimal duct 09/01/2015  . Neck mass 07/28/2015  . Bilateral carpal tunnel syndrome 06/05/2015  . SVT (supraventricular tachycardia) ([place]) 07/07/2013  . Essential hypertension 05/19/2013  . Hyperlipidemia 05/19/2013  . Atypical chest pain 03/29/2013  . Right lumbar radiculitis 01/26/2013  . Osteoarthritis of right knee 12/29/2012   Kerin Perna, PTA 06/06/16 8:52 AM  [facility] [place], [place], [address] Phone: [phone]   Fax:  [phone]  Name: Robert Guerrero MRN: [id number] Date of Birth: 27-Dec-1950

## 2016-06-06 NOTE — Patient Instructions (Signed)
  Abdominal Bracing With Pelvic Floor (Hook-Lying)   With neutral spine, tighten pelvic floor and abdominals. Hold 10 seconds. Repeat __10_ times. Do _1__ times a day.   Knee to Chest: Transverse Plane Stability   Bring one knee up, then return. Be sure pelvis does not roll side to side. Keep pelvis still. Lift knee __10_ times each leg. Restabilize pelvis. Repeat with other leg. Do _1-2__ sets, _1__ times per day.  Hip External Rotation With Pillow: Transverse Plane Stability   Keep BOTH KNEES BENT. Slowly roll bent knee out. Be sure pelvis does not rotate. Do _10__ times. Restabilize pelvis. Repeat with other leg. Do _1-2__ sets, _1__ times per day.   [facility] [place], [place] [address]  [phone] (office) [phone] (fax)

## 2016-06-11 ENCOUNTER — Encounter: Payer: Commercial Managed Care - HMO | Admitting: Rehabilitative and Restorative Service Providers"

## 2016-06-13 ENCOUNTER — Ambulatory Visit (INDEPENDENT_AMBULATORY_CARE_PROVIDER_SITE_OTHER): Payer: Medicare HMO | Admitting: Sports Medicine

## 2016-06-13 ENCOUNTER — Ambulatory Visit (INDEPENDENT_AMBULATORY_CARE_PROVIDER_SITE_OTHER): Payer: Medicare HMO | Admitting: Physical Therapy

## 2016-06-13 ENCOUNTER — Encounter: Payer: Self-pay | Admitting: Sports Medicine

## 2016-06-13 DIAGNOSIS — R293 Abnormal posture: Secondary | ICD-10-CM | POA: Diagnosis not present

## 2016-06-13 DIAGNOSIS — I10 Essential (primary) hypertension: Secondary | ICD-10-CM

## 2016-06-13 DIAGNOSIS — M5416 Radiculopathy, lumbar region: Secondary | ICD-10-CM

## 2016-06-13 DIAGNOSIS — K219 Gastro-esophageal reflux disease without esophagitis: Secondary | ICD-10-CM

## 2016-06-13 DIAGNOSIS — R5383 Other fatigue: Secondary | ICD-10-CM

## 2016-06-13 DIAGNOSIS — M6281 Muscle weakness (generalized): Secondary | ICD-10-CM

## 2016-06-13 DIAGNOSIS — N139 Obstructive and reflux uropathy, unspecified: Secondary | ICD-10-CM | POA: Diagnosis not present

## 2016-06-13 DIAGNOSIS — F329 Major depressive disorder, single episode, unspecified: Secondary | ICD-10-CM

## 2016-06-13 MED ORDER — BUPROPION HCL ER (XL) 300 MG PO TB24: 300.0000 mg | ORAL_TABLET | Freq: Every day | ORAL | 3 refills | Status: DC

## 2016-06-13 MED ORDER — VALSARTAN-HYDROCHLOROTHIAZIDE 320-25 MG PO TABS: 1.0000 | ORAL_TABLET | Freq: Every day | ORAL | 3 refills | Status: DC

## 2016-06-13 MED ORDER — TAMSULOSIN HCL 0.4 MG PO CAPS: 0.4000 mg | ORAL_CAPSULE | Freq: Every day | ORAL | 3 refills | Status: DC

## 2016-06-13 NOTE — Assessment & Plan Note (Signed)
Doing well with Protonix, no changes here.

## 2016-06-13 NOTE — Assessment & Plan Note (Signed)
With nocturia, adding Flomax.

## 2016-06-13 NOTE — Therapy (Signed)
[place], [place], [address] Phone: [phone]   Fax:  [phone]  Physical Therapy Treatment  Patient Details  Name: Robert Guerrero MRN: [id number] Date of Birth: June 12, 1950 Referring Provider: Dr Dianah Field  Encounter Date: 06/13/2016      PT End of Session - 06/13/16 0926    Visit Number 4   Number of Visits 12   Date for PT Re-Evaluation 07/11/16   PT Start Time 0845   PT Stop Time 0940   PT Time Calculation (min) 55 min   Activity Tolerance Patient tolerated treatment well   Behavior During Therapy [facility] for tasks assessed/performed      Past Medical History:  Diagnosis Date  . Hyperlipidemia   . Hypertension   . Osteoarthritis of right knee   . Right lumbar radiculitis   . SVT (supraventricular tachycardia) ([facility])     No past surgical history on file.  There were no vitals filed for this visit.      Subjective Assessment - 06/13/16 0847    Subjective back feels "the same."  feels pain/tightness is mostly when standing; goes to MD at 10:45 today   Pertinent History Rt knee torn meniscus treated with injections about 6 months agoRt shoulder pain    Patient Stated Goals eliminate cause of discomfort in the back and get rid of the tightness in the back    Currently in Pain? No/denies            [facility] PT Assessment - 06/13/16 0001      Assessment   Medical Diagnosis Rt lumbar radiculopathy    Referring Provider Dr Dianah Field   Onset Date/Surgical Date 08/31/15   Hand Dominance Right   Next MD Visit 06/13/16                     [facility] Adult PT Treatment/Exercise - 06/13/16 0848      Self-Care   Self-Care Other Self-Care Comments   Other Self-Care Comments  mod to max cues for proper form with HEP as pt had many questions throughout exercises     Lumbar Exercises: Stretches   Passive Hamstring Stretch 3 reps;30 seconds   Passive Hamstring Stretch  Limitations bil; supine with strap   Piriformis Stretch 3 reps;30 seconds   Piriformis Stretch Limitations travell-with strap     Lumbar Exercises: Aerobic   Stationary Bike NuStep L5: 8 min      Lumbar Exercises: Supine   Ab Set 10 reps  10 sec   Clam 10 reps;2 seconds   Clam Limitations with ab set   Bent Knee Raise 10 reps   Bent Knee Raise Limitations with ab set     Moist Heat Therapy   Number Minutes Moist Heat 15 Minutes   Moist Heat Location Lumbar Spine;Hip     Electrical Stimulation   Electrical Stimulation Location lumbar; Rt hip/piriformis    Electrical Stimulation Action IFC   Electrical Stimulation Parameters to tolerance   Electrical Stimulation Goals Tone;Pain                     PT Long Term Goals - 06/04/16 0809      PT LONG TERM GOAL #1   Title Improve posture and alignment with standing and walking with patient to demonstrate increased hip extension 07/11/16   Time 6   Period Weeks   Status On-going     PT LONG TERM GOAL #2   Title  Increase mobility and ROM through bilat hips with patient to demonstrate/report increased movement in LE's for functional activities such as dressing and putting on shoes 07/11/16   Time 6   Period Weeks   Status On-going     PT LONG TERM GOAL #3   Title Increase strength bilat hips to 5-/5 to 5/5 07/11/16   Time 6   Period Weeks   Status On-going     PT LONG TERM GOAL #4   Title Patient tolerates sitting and standing for 30-45 min with minimal to no increase in pain 07/11/16   Time 6   Period Weeks   Status On-going     PT LONG TERM GOAL #5   Title Independent in HEP 07/11/16   Time 6   Period Weeks   Status On-going     PT LONG TERM GOAL #6   Title Improve FOTO to </= 39% limitation 07/11/16   Time 6   Period Weeks   Status On-going               Plan - 06/13/16 0926    Clinical Impression Statement Pt reports no significant improvement in symptoms, but only 4 sessions into PT.   Requires many cues for proper form and technique with exercises.  No goals met but progressing slowly towards.   PT Treatment/Interventions Patient/family education;ADLs/Self Care Home Management;Neuromuscular re-education;Cryotherapy;Electrical Stimulation;Iontophoresis 44m/ml Dexamethasone;Moist Heat;Traction;Ultrasound;Dry needling;Manual techniques;Therapeutic activities;Therapeutic exercise   PT Next Visit Plan review any HEP that pt has questions about, hip flexor stretch, modailities PRN   Consulted and Agree with Plan of Care Patient      Patient will benefit from skilled therapeutic intervention in order to improve the following deficits and impairments:  Postural dysfunction, Improper body mechanics, Pain, Decreased range of motion, Decreased mobility, Decreased strength, Increased muscle spasms, Increased fascial restricitons, Decreased activity tolerance  Visit Diagnosis: Radiculopathy, lumbar region  Abnormal posture  Muscle weakness (generalized)     Problem List Patient Active Problem List   Diagnosis Date Noted  . Fatigue 05/16/2016  . GERD (gastroesophageal reflux disease) 05/16/2016  . Obesity 05/16/2016  . Annual physical exam 05/16/2016  . Major depression, chronic 05/16/2016  . Dacryostenosis of right nasolacrimal duct 09/01/2015  . Neck mass 07/28/2015  . Bilateral carpal tunnel syndrome 06/05/2015  . [place] 07/[place]  . Essential hypertension 05/19/2013  . Hyperlipidemia 05/19/2013  . Atypical chest pain 03/29/2013  . Right lumbar radiculitis 01/26/2013  . Osteoarthritis of right knee 12/29/2012      SLaureen Abrahams PT, DPT 06/13/16 9:28 AM    [facility] [address]Phone: [phone]  Fax:  [phone] Name: APearl BerlingerMRN: [id number] of Birth: 8February 23, 1952

## 2016-06-13 NOTE — Assessment & Plan Note (Signed)
Still elevated, switching to valsartan/HCTZ max dose

## 2016-06-13 NOTE — Assessment & Plan Note (Signed)
Increasing Wellbutrin to 300 mg. Return in one month for a PHQ9.

## 2016-06-13 NOTE — Progress Notes (Signed)
  Subjective:    CC: Follow-up  HPI: Hypertension: Still elevated  Depression: Moderate symptoms persistent. No suicidal or homicidal ideation.  GERD: Well controlled with Protonix  Nocturia: Still wakes up several times at night to void. We have not yet started Flomax.  Past medical history:  Negative.  See flowsheet/record as well for more information.  Surgical history: Negative.  See flowsheet/record as well for more information.  Family history: Negative.  See flowsheet/record as well for more information.  Social history: Negative.  See flowsheet/record as well for more information.  Allergies, and medications have been entered into the medical record, reviewed, and no changes needed.   Review of Systems: No fevers, chills, night sweats, weight loss, chest pain, or shortness of breath.   Objective:    General: Well Developed, well nourished, and in no acute distress.  Neuro: Alert and oriented x3, extra-ocular muscles intact, sensation grossly intact.  HEENT: Normocephalic, atraumatic, pupils equal round reactive to light, neck supple, no masses, no lymphadenopathy, thyroid nonpalpable.  Skin: Warm and dry, no rashes. Cardiac: Regular rate and rhythm, no murmurs rubs or gallops, no lower extremity edema.  Respiratory: Clear to auscultation bilaterally. Not using accessory muscles, speaking in full sentences.  Impression and Recommendations:    Essential hypertension Still elevated, switching to valsartan/HCTZ max dose  GERD (gastroesophageal reflux disease) Doing well with Protonix, no changes here.  Major depression, chronic Increasing Wellbutrin to 300 mg. Return in one month for a PHQ9.  Obstructive uropathy With nocturia, adding Flomax.  I spent 40 minutes with this patient, greater than 50% was face-to-face time counseling regarding the above diagnoses

## 2016-06-18 ENCOUNTER — Ambulatory Visit (INDEPENDENT_AMBULATORY_CARE_PROVIDER_SITE_OTHER): Payer: Medicare HMO | Admitting: Physical Therapy

## 2016-06-18 VITALS — BP 155/85 | HR 82

## 2016-06-18 DIAGNOSIS — R293 Abnormal posture: Secondary | ICD-10-CM | POA: Diagnosis not present

## 2016-06-18 DIAGNOSIS — M6281 Muscle weakness (generalized): Secondary | ICD-10-CM

## 2016-06-18 DIAGNOSIS — M5416 Radiculopathy, lumbar region: Secondary | ICD-10-CM | POA: Diagnosis not present

## 2016-06-18 NOTE — Therapy (Signed)
[place], [place], [address] Phone: [phone]   Fax:  [phone]  Physical Therapy Treatment  Patient Details  Name: Robert Guerrero MRN: [id number] Date of Birth: [birth year] Referring Provider: Dr. Dianah Field  Encounter Date: 06/18/2016      PT End of Session - 06/18/16 1055    Visit Number 5   Number of Visits 12   Date for PT Re-Evaluation 07/11/16   PT Start Time 4765  delayed start due to BP monitoring.   PT Stop Time 0845   PT Time Calculation (min) 29 min   Activity Tolerance Patient tolerated treatment well;No increased pain   Behavior During Therapy WFL for tasks assessed/performed      Past Medical History:  Diagnosis Date  . Hyperlipidemia   . Hypertension   . Osteoarthritis of right knee   . Right lumbar radiculitis   . SVT (supraventricular tachycardia) (HCC)     No past surgical history on file.  Vitals:   06/18/16 0817 06/18/16 0818  BP: (!) 201/115 (!) 155/85  Pulse: 95 82        Subjective Assessment - 06/18/16 0818    Subjective Pt reports he "fell out" on Saturday while sitting down gardening.  He had "heart palpatations" right before it happened, BP of 92/60.  He states his doctor recently changed his medications.  No further episodes of this since then.  He has tried to be more aware of engaging his core muscles when standing at work.     Currently in Pain? No/denies   Pain Score 0-No pain   Pain Location Back            [facility] PT Assessment - 06/18/16 0001      Assessment   Medical Diagnosis Rt lumbar radiculopathy    Referring Provider Dr. Dianah Field   Onset Date/Surgical Date 08/31/15   Hand Dominance Right   Next MD Visit 07/11/16     Strength   Right/Left Hip Right;Left   Right Hip Flexion 5/5   Right Hip Extension --  5-/5   Right Hip ABduction --  5-/5   Left Hip Flexion 5/5   Left Hip Extension --  5-/5   Left Hip ABduction --   5-/5     Flexibility   Hamstrings SLR with opp leg straight: RLE 75 deg, LLE                      [facility] Adult PT Treatment/Exercise - 06/18/16 0001      Lumbar Exercises: Stretches   Passive Hamstring Stretch 3 reps;30 seconds   Piriformis Stretch 3 reps;30 seconds   Piriformis Stretch Limitations travell-with strap     Lumbar Exercises: Aerobic   Stationary Bike NuStep L5: 8 min      Lumbar Exercises: Seated   Hip Flexion on Ball Both;10 reps, 2 sets    Hip Flexion on Ball Limitations seated on black mat   Sit to Stand 10 reps  with core engaged.    Sit to Stand Limitations VC to breathe with exertion     Lumbar Exercises: Supine   Ab Set 5 reps;5 seconds                     PT Long Term Goals - 06/18/16 4650      PT LONG TERM GOAL #2   Title Increase mobility and ROM through bilat hips with patient to demonstrate/report increased movement  in LE's for functional activities such as dressing and putting on shoes 07/11/16   Time 6   Period Weeks     PT LONG TERM GOAL #3   Title Increase strength bilat hips to 5-/5 to 5/5 07/11/16   Time 6   Period Weeks   Status Achieved     PT LONG TERM GOAL #4   Title Patient tolerates sitting and standing for 30-45 min with minimal to no increase in pain 07/11/16   Time 6   Period Weeks   Status On-going  tolerating 20 min 06/18/16     PT LONG TERM GOAL #5   Title Independent in HEP 07/11/16   Time 6   Period Weeks   Status On-going     PT LONG TERM GOAL #6   Title Improve FOTO to </= 39% limitation 07/11/16   Time 6   Period Weeks   Status On-going               Plan - 06/18/16 0626    Clinical Impression Statement Treatment delayed due to BP monitoring prior to initiating session.  BP initially 201/115 with normal cuff, rechecked with large cuff and was 155/85.  Pt demonstrated improved bilat hip strength; has met LTG#3.  Pt tolerated all exercises without increased in symptoms.  Pt  encouraged to inform MD of BP/HR issues since medication adjustments.   Modalities held since pt was pain free at end of session.  Pt encouraged to bring TENS unit in for review on use at home.    Rehab Potential Good   PT Frequency 2x / week   PT Duration 8 weeks   PT Treatment/Interventions Patient/family education;ADLs/Self Care Home Management;Neuromuscular re-education;Cryotherapy;Electrical Stimulation;Iontophoresis 46m/ml Dexamethasone;Moist Heat;Traction;Ultrasound;Dry needling;Manual techniques;Therapeutic activities;Therapeutic exercise   PT Next Visit Plan Cont postural strengthening, monitor BP.    Consulted and Agree with Plan of Care Patient      Patient will benefit from skilled therapeutic intervention in order to improve the following deficits and impairments:  Postural dysfunction, Improper body mechanics, Pain, Decreased range of motion, Decreased mobility, Decreased strength, Increased muscle spasms, Increased fascial restricitons, Decreased activity tolerance  Visit Diagnosis: Radiculopathy, lumbar region  Abnormal posture  Muscle weakness (generalized)     Problem List Patient Active Problem List   Diagnosis Date Noted  . Obstructive uropathy 06/13/2016  . Fatigue 05/16/2016  . GERD (gastroesophageal reflux disease) 05/16/2016  . Obesity 05/16/2016  . Annual physical exam 05/16/2016  . Major depression, chronic 05/16/2016  . Dacryostenosis of right nasolacrimal duct 09/01/2015  . Neck mass 07/28/2015  . Bilateral carpal tunnel syndrome 06/05/2015  . SV[place] ([place]ricular tachycardia) ([place] 07/07/2013  . Essential hypertension 05/19/2013  . Hyperlipidemia 05/19/2013  . Atypical chest pain 03/29/2013  . Right lumbar radiculitis 01/26/2013  . Osteoarthritis of right knee 12/29/2012   JKerin Perna PTA 06/18/16 10:58 AM  [facility] [address]Phone:  [phone]  Fax:  [phone] Name: ANichoals HeydeMRN: [id number] of Birth: [birth year]

## 2016-06-20 ENCOUNTER — Ambulatory Visit (INDEPENDENT_AMBULATORY_CARE_PROVIDER_SITE_OTHER): Payer: Medicare HMO | Admitting: Rehabilitative and Restorative Service Providers"

## 2016-06-20 ENCOUNTER — Encounter: Payer: Self-pay | Admitting: Rehabilitative and Restorative Service Providers"

## 2016-06-20 DIAGNOSIS — M6281 Muscle weakness (generalized): Secondary | ICD-10-CM

## 2016-06-20 DIAGNOSIS — M5416 Radiculopathy, lumbar region: Secondary | ICD-10-CM | POA: Diagnosis not present

## 2016-06-20 DIAGNOSIS — R293 Abnormal posture: Secondary | ICD-10-CM | POA: Diagnosis not present

## 2016-06-20 NOTE — Patient Instructions (Addendum)
Trigger Point Dry Needling  . What is Trigger Point Dry Needling (DN)? o DN is a physical therapy technique used to treat muscle pain and dysfunction. Specifically, DN helps deactivate muscle trigger points (muscle knots).  o A thin filiform needle is used to penetrate the skin and stimulate the underlying trigger point. The goal is for a local twitch response (LTR) to occur and for the trigger point to relax. No medication of any kind is injected during the procedure.   . What Does Trigger Point Dry Needling Feel Like?  o The procedure feels different for each individual patient. Some patients report that they do not actually feel the needle enter the skin and overall the process is not painful. Very mild bleeding may occur. However, many patients feel a deep cramping in the muscle in which the needle was inserted. This is the local twitch response.   Marland Kitchen How Will I feel after the treatment? o Soreness is normal, and the onset of soreness may not occur for a few hours. Typically this soreness does not last longer than two days.  o Bruising is uncommon, however; ice can be used to decrease any possible bruising.  o In rare cases feeling tired or nauseous after the treatment is normal. In addition, your symptoms may get worse before they get better, this period will typically not last longer than 24 hours.   . What Can I do After My Treatment? o Increase your hydration by drinking more water for the next 24 hours. o You may place ice or heat on the areas treated that have become sore, however, do not use heat on inflamed or bruised areas. Heat often brings more relief post needling. o You can continue your regular activities, but vigorous activity is not recommended initially after the treatment for 24 hours. o DN is best combined with other physical therapy such as strengthening, stretching, and other therapies.   Quads / HF, Lunge    Sitting on the edge of a chair. Drop knee toward the floor  and engage the core Push pelvis down slowly while slightly arching back until stretch is felt on front of hip. Hold _60__ seconds. Repeat __3_ times per session. Do _3__ sessions per day.

## 2016-06-20 NOTE — Therapy (Signed)
[place] [facility] [place], [place], [address] Phone: [phone]   Fax:  [phone]  Physical Therapy Treatment  Patient Details  Name: Robert Guerrero MRN: [id number] Date of Birth: 07/24/1950 Referring Provider: Dr. Dianah Field  Encounter Date: 06/20/2016      PT End of Session - 06/20/16 0803    Visit Number 6   Number of Visits 12   Date for PT Re-Evaluation 07/11/16   PT Start Time 0804   PT Stop Time 0851   PT Time Calculation (min) 47 min   Activity Tolerance Patient tolerated treatment well      Past Medical History:  Diagnosis Date  . Hyperlipidemia   . Hypertension   . Osteoarthritis of right knee   . Right lumbar radiculitis   . SVT (supraventricular tachycardia) ([place])     History reviewed. No pertinent surgical history.  There were no vitals filed for this visit.      Subjective Assessment - 06/20/16 0804    Subjective Patient reports that he continues to have pain with standing more than 20-30 minutes. Working to engage the core but has trouble doing exercises at home consistently. Has had DN for shoudler in the past - willing to try for LB/hips where the tightness seems to be the worst.    Currently in Pain? No/denies   Pain Location Back   Pain Orientation Right;Left;Lower   Pain Descriptors / Indicators Tightness  stiffness    Pain Type Chronic pain   Pain Radiating Towards into hips Rt > Lt then into legs back and side    Pain Onset More than a month ago   Pain Frequency Intermittent                         [facility] Adult PT Treatment/Exercise - 06/20/16 0001      Lumbar Exercises: Stretches   Passive Hamstring Stretch 3 reps;30 seconds   Passive Hamstring Stretch Limitations bil; supine with strap   Hip Flexor Stretch 2 reps;60 seconds  sitting one leg dropped off chair knee to floor    Standing Extension 3 reps   Press Ups --  1-2 sec hold x 10      Lumbar  Exercises: Supine   Ab Set --  3 part core 10 sec x 10      Moist Heat Therapy   Number Minutes Moist Heat 20 Minutes   Moist Heat Location Lumbar Spine;Hip  bilat hips      Electrical Stimulation   Electrical Stimulation Location Bilat lumbar; Rt/Lt hip/piriformis    Electrical Stimulation Action IFC   Electrical Stimulation Parameters to tolerance   Electrical Stimulation Goals Tone     Manual Therapy   Soft tissue mobilization bilat lumbar paraspinals; bilat hip abductors    Myofascial Release lumbar           Trigger Point Dry Needling - 06/20/16 0836    Consent Given? Yes   Education Handout Provided Yes   Longissimus Response Palpable increased muscle length   Gluteus Maximus Response Palpable increased muscle length   Gluteus Minimus Response Palpable increased muscle length   Piriformis Response Palpable increased muscle length              PT Education - 06/20/16 0839    Education provided Yes   Education Details HEP DN   Person(s) Educated Patient   Methods Explanation;Demonstration;Tactile cues;Verbal cues;Handout   Comprehension Verbalized understanding;Returned demonstration;Verbal cues  required;Tactile cues required             PT Long Term Goals - 06/20/16 0842      PT LONG TERM GOAL #1   Title Improve posture and alignment with standing and walking with patient to demonstrate increased hip extension 07/11/16   Time 6   Period Weeks   Status On-going     PT LONG TERM GOAL #2   Title Increase mobility and ROM through bilat hips with patient to demonstrate/report increased movement in LE's for functional activities such as dressing and putting on shoes 07/11/16   Time 6   Period Weeks   Status On-going     PT LONG TERM GOAL #3   Title Increase strength bilat hips to 5-/5 to 5/5 07/11/16   Time 6   Period Weeks   Status Achieved     PT LONG TERM GOAL #4   Title Patient tolerates sitting and standing for 30-45 min with minimal to no  increase in pain 07/11/16   Time 6   Period Weeks   Status On-going     PT LONG TERM GOAL #5   Title Independent in HEP 07/11/16   Time 6   Period Weeks   Status On-going     PT LONG TERM GOAL #6   Title Improve FOTO to </= 39% limitation 07/11/16   Time 6   Period Weeks   Status On-going               Plan - 06/20/16 0840    Clinical Impression Statement Continued muscular tightness with patient reporting pain and incresaed tightness at the mid to end of the day with functional activities requiring standing more that 20-30 min. Patient has not done exercises consistently at home. Tolerated DN without difficulty. Some soreness. Will continue efforts with core stabilization; stretching; strengthening; trial of DN.    Rehab Potential Good   PT Frequency 2x / week   PT Duration 8 weeks   PT Treatment/Interventions Patient/family education;ADLs/Self Care Home Management;Neuromuscular re-education;Cryotherapy;Electrical Stimulation;Iontophoresis 4mg /ml Dexamethasone;Moist Heat;Traction;Ultrasound;Dry needling;Manual techniques;Therapeutic activities;Therapeutic exercise   PT Next Visit Plan Cont postural strengthening, stretching; assess response to DN; modalities as indicated    Consulted and Agree with Plan of Care Patient      Patient will benefit from skilled therapeutic intervention in order to improve the following deficits and impairments:  Postural dysfunction, Improper body mechanics, Pain, Decreased range of motion, Decreased mobility, Decreased strength, Increased muscle spasms, Increased fascial restricitons, Decreased activity tolerance  Visit Diagnosis: Radiculopathy, lumbar region  Abnormal posture  Muscle weakness (generalized)     Problem List Patient Active Problem List   Diagnosis Date Noted  . Obstructive uropathy 06/13/2016  . Fatigue 05/16/2016  . GERD (gastroesophageal reflux disease) 05/16/2016  . Obesity 05/16/2016  . Annual physical exam  05/16/2016  . Major depression, chronic 05/16/2016  . Dacryostenosis of right nasolacrimal duct 09/01/2015  . Neck mass 07/28/2015  . Bilateral carpal tunnel syndrome 06/05/2015  . SVT (supraventricular tachycardia) ([place]) 07/07/2013  . Essential hypertension 05/19/2013  . Hyperlipidemia 05/19/2013  . Atypical chest pain 03/29/2013  . Right lumbar radiculitis 01/26/2013  . Osteoarthritis of right knee 12/29/2012    Natascha Edmonds Nilda Simmer PT, MPH  06/20/2016, 8:44 AM  [facility] [place], [place], [address] Phone: [phone]   Fax:  [phone]  Name: Sabastian Raimondi MRN: [id number] Date of Birth: 03/15/1951

## 2016-06-21 ENCOUNTER — Telehealth: Payer: Self-pay

## 2016-06-21 NOTE — Telephone Encounter (Signed)
He needs to check his blood pressures and let us know, he can cut the pill in half for now.

## 2016-06-21 NOTE — Telephone Encounter (Signed)
Pt left VM stating he feels his blood pressure is too low on new BP medications. Pt states he passed out on Saturday and has been dizzy. Please advise.

## 2016-06-24 NOTE — Telephone Encounter (Signed)
Left VM

## 2016-06-25 ENCOUNTER — Telehealth: Payer: Self-pay

## 2016-06-25 NOTE — Telephone Encounter (Signed)
Do the half dose of the antidepressant for 2 weeks and then go back up to a full dose. Also it is appropriate to continue physical therapy if he is still having pain. If not he can stop it and switch to a home exercise program doing the same thing.

## 2016-06-25 NOTE — Telephone Encounter (Signed)
Pt left VM stating he feels the anti-depressant is causing his dizziness and has cut them in half. Pt would also like to know if he's supposed to continue PT at this point. Please advise.

## 2016-06-26 NOTE — Telephone Encounter (Signed)
Pt notified. Stated he feels the anti-depressant is causing palpitations and is what lead to him passing out. Please advise.

## 2016-06-27 NOTE — Telephone Encounter (Signed)
He can go ahead and stop it and come to see me for another plan.  Needs to be checking his blood pressure and heart rate whenever he feels like he's going to pass out.

## 2016-06-27 NOTE — Telephone Encounter (Signed)
Pt notified appointment booked.

## 2016-07-02 ENCOUNTER — Encounter: Payer: Self-pay | Admitting: Sports Medicine

## 2016-07-02 ENCOUNTER — Ambulatory Visit (INDEPENDENT_AMBULATORY_CARE_PROVIDER_SITE_OTHER): Payer: Medicare HMO | Admitting: Rehabilitative and Restorative Service Providers"

## 2016-07-02 ENCOUNTER — Ambulatory Visit (INDEPENDENT_AMBULATORY_CARE_PROVIDER_SITE_OTHER): Payer: Medicare HMO | Admitting: Sports Medicine

## 2016-07-02 ENCOUNTER — Encounter: Payer: Self-pay | Admitting: Rehabilitative and Restorative Service Providers"

## 2016-07-02 DIAGNOSIS — I10 Essential (primary) hypertension: Secondary | ICD-10-CM | POA: Diagnosis not present

## 2016-07-02 DIAGNOSIS — M6281 Muscle weakness (generalized): Secondary | ICD-10-CM | POA: Diagnosis not present

## 2016-07-02 DIAGNOSIS — R293 Abnormal posture: Secondary | ICD-10-CM | POA: Diagnosis not present

## 2016-07-02 DIAGNOSIS — M5416 Radiculopathy, lumbar region: Secondary | ICD-10-CM | POA: Diagnosis not present

## 2016-07-02 DIAGNOSIS — F329 Major depressive disorder, single episode, unspecified: Secondary | ICD-10-CM

## 2016-07-02 MED ORDER — SERTRALINE HCL 25 MG PO TABS: 25.0000 mg | ORAL_TABLET | Freq: Every day | ORAL | 2 refills | Status: DC

## 2016-07-02 MED ORDER — VALSARTAN 320 MG PO TABS: 320.0000 mg | ORAL_TABLET | Freq: Every day | ORAL | 0 refills | Status: DC

## 2016-07-02 MED ORDER — AMLODIPINE BESYLATE 5 MG PO TABS: 5.0000 mg | ORAL_TABLET | Freq: Every day | ORAL | 0 refills | Status: DC

## 2016-07-02 NOTE — Assessment & Plan Note (Signed)
Discontinue valsartan/hydrochlorothiazide, switching to simply valsartan, and adding amlodipine. Return weekly for blood pressure medication augmentation.

## 2016-07-02 NOTE — Assessment & Plan Note (Signed)
Discontinued Wellbutrin, switching to Zoloft, return in one month for a PHQ9 in GAD7.

## 2016-07-02 NOTE — Patient Instructions (Addendum)
Sitting    Sit in chair with knees spread apart. Bend forward toward floor. Comfortable stretch should be felt in lower back. Hold _20__ seconds. Repeat _2-3__ times per session. Do _as needed during the day

## 2016-07-02 NOTE — Therapy (Signed)
[place], [place], [address] Phone: [phone]   Fax:  [phone]  Physical Therapy Treatment  Patient Details  Name: Robert Guerrero MRN: [id number] Date of Birth: 1951-02-06 Referring Provider: Dr Dianah Field  Encounter Date: 07/02/2016      PT End of Session - 07/02/16 0853    Visit Number 7   Number of Visits 12   Date for PT Re-Evaluation 07/11/16   PT Start Time 0848   PT Stop Time 0942   PT Time Calculation (min) 54 min   Activity Tolerance Patient tolerated treatment well      Past Medical History:  Diagnosis Date  . Hyperlipidemia   . Hypertension   . Osteoarthritis of right knee   . Right lumbar radiculitis   . SVT (supraventricular tachycardia) ([place])     History reviewed. No pertinent surgical history.  There were no vitals filed for this visit.      Subjective Assessment - 07/02/16 0900    Subjective Really liked the DN - was sore afterwards but then felt much better - looser with less pain. Having difficulty remembering the stretches for home/work. Still having increased pain after atanding 20-30 min at work. Having difficulty with meds Dr T has him on for depression and will see Dr T today to discuss medications.    Currently in Pain? No/denies            [facility] PT Assessment - 07/02/16 0001      Assessment   Medical Diagnosis Rt lumbar radiculopathy    Referring Provider Dr Dianah Field   Onset Date/Surgical Date 08/31/15   Hand Dominance Right   Next MD Visit 07/11/16     Strength   Right Hip Flexion 5/5   Right Hip Extension --  5-/5   Right Hip ABduction --  5-/5   Left Hip Flexion 5/5   Left Hip Extension --  5-/5   Left Hip ABduction --  5-/5     Flexibility   Soft Tissue Assessment /Muscle Length --  significant tightness bilat hip flexors      Palpation   Palpation comment tight/tender Rt posterior hip/piriformis/gluteals                       [facility] Adult PT Treatment/Exercise - 07/02/16 0001      Exercises   Exercises --  sitting forward trunk flexion 10-20 sec hold x 3      Lumbar Exercises: Stretches   Passive Hamstring Stretch 3 reps;30 seconds   Passive Hamstring Stretch Limitations bil; supine with strap   Hip Flexor Stretch 2 reps;60 seconds  sitting one leg dropped off chair knee to floor    Standing Extension 3 reps   Press Ups --  1-2 sec hold x 10    Piriformis Stretch 3 reps;30 seconds   Piriformis Stretch Limitations travell-with UE assist      Lumbar Exercises: Aerobic   Stationary Bike NuStep L5: 5 min      Moist Heat Therapy   Number Minutes Moist Heat 20 Minutes   Moist Heat Location Lumbar Spine;Hip  bilat hips      Electrical Stimulation   Electrical Stimulation Location Bilat lumbar; Rt/Lt hip/piriformis    Electrical Stimulation Action IFC   Electrical Stimulation Parameters to tolerance   Electrical Stimulation Goals Tone     Manual Therapy   Soft tissue mobilization bilat lumbar paraspinals; bilat hip abductors    Myofascial  Release lumbar           Trigger Point Dry Needling - 07/02/16 4081    Consent Given? Yes   Muscles Treated Lower Body --  working bilat   Longissimus Response Palpable increased muscle length   Gluteus Maximus Response Palpable increased muscle length   Gluteus Minimus Response Palpable increased muscle length   Piriformis Response Palpable increased muscle length              PT Education - 07/02/16 0906    Education provided Yes   Education Details HEP    Person(s) Educated Patient   Methods Explanation;Demonstration;Tactile cues;Verbal cues;Handout   Comprehension Verbalized understanding;Returned demonstration;Verbal cues required;Tactile cues required             PT Long Term Goals - 07/02/16 0854      PT LONG TERM GOAL #1   Title Improve posture and alignment with standing and walking with patient to  demonstrate increased hip extension 07/11/16   Time 6   Period Weeks   Status On-going     PT LONG TERM GOAL #2   Title Increase mobility and ROM through bilat hips with patient to demonstrate/report increased movement in LE's for functional activities such as dressing and putting on shoes 07/11/16   Time 6   Period Weeks   Status On-going     PT LONG TERM GOAL #3   Title Increase strength bilat hips to 5-/5 to 5/5 07/11/16   Time 6   Period Weeks   Status On-going     PT LONG TERM GOAL #4   Title Patient tolerates sitting and standing for 30-45 min with minimal to no increase in pain 07/11/16   Time 6   Period Weeks   Status On-going     PT LONG TERM GOAL #5   Title Independent in HEP 07/11/16   Time 6   Period Weeks   Status On-going     PT LONG TERM GOAL #6   Title Improve FOTO to </= 39% limitation 07/11/16   Time 6   Period Weeks   Status On-going               Plan - 07/02/16 0855    Clinical Impression Statement Good response to DN at last visit. Patient reports that he is having difficulty with remembering the exercises. He has problems with tightening in the low back after he has been standing for a period of time. He is not consistent with exercises but is trying to stretch more consistently. He continues to have sensation of tightness. Good response to DN. Continues to have increased pain with standing more than 20-30 min. Gradually working toward goals of therapy.    Rehab Potential Good   PT Frequency 2x / week   PT Duration 8 weeks   PT Treatment/Interventions Patient/family education;ADLs/Self Care Home Management;Neuromuscular re-education;Cryotherapy;Electrical Stimulation;Iontophoresis 4mg /ml Dexamethasone;Moist Heat;Traction;Ultrasound;Dry needling;Manual techniques;Therapeutic activities;Therapeutic exercise   PT Next Visit Plan Cont postural strengthening, stretching; continue DN; modalities as indicated    Consulted and Agree with Plan of Care  Patient      Patient will benefit from skilled therapeutic intervention in order to improve the following deficits and impairments:  Postural dysfunction, Improper body mechanics, Pain, Decreased range of motion, Decreased mobility, Decreased strength, Increased muscle spasms, Increased fascial restricitons, Decreased activity tolerance  Visit Diagnosis: Radiculopathy, lumbar region  Abnormal posture  Muscle weakness (generalized)     Problem List Patient Active Problem List   Diagnosis  Date Noted  . Obstructive uropathy 06/13/2016  . Fatigue 05/16/2016  . GERD (gastroesophageal reflux disease) 05/16/2016  . Obesity 05/16/2016  . Annual physical exam 05/16/2016  . Major depression, chronic 05/16/2016  . Dacryostenosis of right nasolacrimal duct 09/01/2015  . Neck mass 07/28/2015  . Bilateral carpal tunnel syndrome 06/05/2015  . SVT (supraventricular tachycardia) ([place]) 07/07/2013  . Essential hypertension 05/19/2013  . Hyperlipidemia 05/19/2013  . Atypical chest pain 03/29/2013  . Right lumbar radiculitis 01/26/2013  . Osteoarthritis of right knee 12/29/2012    Harsha Yusko Nilda Simmer PT, MPH  07/02/2016, 9:31 AM  [facility] [place], [place], [address] Phone: [phone]   Fax:  [phone]  Name: Jahmal Dunavant MRN: [id number] Date of Birth: Dec 19, 1950

## 2016-07-02 NOTE — Progress Notes (Signed)
  Subjective:    CC: mental slowness related to Wellbutrin use  HPI: Mr. Class is a 66 y.o. Male with a history of HTN, depression and anxiety who presents today with mental slowness associated with Wellbutrin use. He says when he misses a dose or only takes a half-dose, he feels less slowness. Pt also says he sometimes misses a dose of Diovan HCT because he doesn't like having to pee all the time.  Pt also complains of numbness in 2nd and 3rd finger bilat but esp in right hand. Also complains of sharp pain laterally in left foot after periods of inactivity. We will discuss MSK problems at a subsequent visit.  190/90  Past medical history:  HTN, SVT, depression, anxiety.  See flowsheet/record as well for more information.  Surgical history: Negative.  See flowsheet/record as well for more information.  Family history: Negative.  See flowsheet/record as well for more information.  Social history: Negative.  See flowsheet/record as well for more information.  Allergies, and medications have been entered into the medical record, reviewed, and no changes needed.   Review of Systems: No fevers, chills, night sweats, weight loss, chest pain, or shortness of breath. Pt does complain of generalized headache.  Objective:    General: Well Developed, well nourished, and in no acute distress.  Neuro: Alert and oriented x3, extra-ocular muscles intact, sensation grossly intact. Pt does exhibit mental slowness responding to questions, but responses are appropriate even though it takes him some time coming up with the correct words to say. HEENT: Normocephalic, atraumatic, pupils equal round reactive to light, neck supple, no masses, no lymphadenopathy, thyroid nonpalpable.  Skin: Warm and dry, no rashes. Cardiac: Regular rate and rhythm, no murmurs rubs or gallops. BP was 190/90. Respiratory: Clear to auscultation bilaterally. Not using accessory muscles.   Impression and Recommendations:    Mr.  Murakami is a 66 y.o. Male with a history of HTN, SVT, depression, and anxiety who presents with obtundation secondary to Wellbutrin use and symptomatic HTN.  Bc of adverse side effect of Wellbutrin, pt switched to Zoloft.  Pt's had a headache and BP of 190/90 on presentation, likely due to medication noncompliance. Pt says he often misses doses bc he doesn't like having to pee all the time. Pt switched from Diovan HCT to Diovan and added amlodipine. We will follow-up with Mr. Cumberland weekly in order to get his BP under control.  Bilateral numbness of 2nd and 3rd fingers and sharp lateral pain in the left foot will be discussed on a subsequent visit.

## 2016-07-04 ENCOUNTER — Ambulatory Visit (INDEPENDENT_AMBULATORY_CARE_PROVIDER_SITE_OTHER): Payer: Medicare HMO | Admitting: Rehabilitative and Restorative Service Providers"

## 2016-07-04 ENCOUNTER — Encounter: Payer: Self-pay | Admitting: Rehabilitative and Restorative Service Providers"

## 2016-07-04 DIAGNOSIS — M6281 Muscle weakness (generalized): Secondary | ICD-10-CM

## 2016-07-04 DIAGNOSIS — M5416 Radiculopathy, lumbar region: Secondary | ICD-10-CM | POA: Diagnosis not present

## 2016-07-04 DIAGNOSIS — R293 Abnormal posture: Secondary | ICD-10-CM | POA: Diagnosis not present

## 2016-07-04 NOTE — Therapy (Signed)
[facility] [place], [place], [address] Phone: [phone]   Fax:  [phone]  Physical Therapy Treatment  Patient Details  Name: Robert Guerrero MRN: [id number] Date of Birth: [birth year] Referring Provider: Dr Dianah Field  Encounter Date: 07/04/2016      PT End of Session - 07/04/16 0851    Visit Number 8   Number of Visits 12   Date for PT Re-Evaluation 07/11/16   PT Start Time 0848   PT Stop Time 0941   PT Time Calculation (min) 53 min   Activity Tolerance Patient tolerated treatment well      Past Medical History:  Diagnosis Date  . Hyperlipidemia   . Hypertension   . Osteoarthritis of right knee   . Right lumbar radiculitis   . SVT (supraventricular tachycardia) ([facility])     History reviewed. No pertinent surgical history.  There were no vitals filed for this visit.      Subjective Assessment - 07/04/16 0852    Subjective Sore from the DN - no change in standing tolerance noted. Did sit down and stretch some when he started hurting and that seemed to help. MD changed medicine Tuesday and he is still trying to adjust.    Currently in Pain? No/denies                         [facility] Adult PT Treatment/Exercise - 07/04/16 0001      Lumbar Exercises: Stretches   Passive Hamstring Stretch 3 reps;30 seconds   Passive Hamstring Stretch Limitations bil; supine with strap   Hip Flexor Stretch 2 reps;60 seconds  sitting one leg dropped off chair knee to floor    Standing Extension 3 reps   Piriformis Stretch 3 reps;30 seconds   Piriformis Stretch Limitations travell-with UE assist      Lumbar Exercises: Aerobic   Stationary Bike NuStep L5: 6 min      Lumbar Exercises: Standing   Functional Squats --  10 sec hold; 10 reps x 2 sets - orange ball for wall slide    Other Standing Lumbar Exercises hip extension; hip abduction leading with heel 2 sets x 10 - facing wall   green TB for  last 10 reps      Moist Heat Therapy   Number Minutes Moist Heat 20 Minutes   Moist Heat Location Lumbar Spine;Hip  bilat hips      Electrical Stimulation   Electrical Stimulation Location Bilat lumbar; Rt/Lt hip/piriformis    Electrical Stimulation Action IFC   Electrical Stimulation Parameters to tolerance   Electrical Stimulation Goals Tone                PT Education - 07/04/16 0919    Education provided Yes   Education Details HEP   Person(s) Educated Patient   Methods Explanation;Demonstration;Tactile cues;Verbal cues;Handout   Comprehension Verbalized understanding;Returned demonstration;Verbal cues required;Tactile cues required             PT Long Term Goals - 07/02/16 0854      PT LONG TERM GOAL #1   Title Improve posture and alignment with standing and walking with patient to demonstrate increased hip extension 07/11/16   Time 6   Period Weeks   Status On-going     PT LONG TERM GOAL #2   Title Increase mobility and ROM through bilat hips with patient to demonstrate/report increased movement in LE's for functional activities such as dressing and putting  on shoes 07/11/16   Time 6   Period Weeks   Status On-going     PT LONG TERM GOAL #3   Title Increase strength bilat hips to 5-/5 to 5/5 07/11/16   Time 6   Period Weeks   Status On-going     PT LONG TERM GOAL #4   Title Patient tolerates sitting and standing for 30-45 min with minimal to no increase in pain 07/11/16   Time 6   Period Weeks   Status On-going     PT LONG TERM GOAL #5   Title Independent in HEP 07/11/16   Time 6   Period Weeks   Status On-going     PT LONG TERM GOAL #6   Title Improve FOTO to </= 39% limitation 07/11/16   Time 6   Period Weeks   Status On-going               Plan - 07/04/16 0853    Clinical Impression Statement Continue efforts to address posture and muscular tightness/weakness. Patient is not consistent with HEP. Standing 20-30 min when he feels  "muscle spasm" tightening and then pain. Continues to have muscular tightness, poor posture and alignment.    Rehab Potential Good   PT Frequency 2x / week   PT Duration 8 weeks   PT Treatment/Interventions Patient/family education;ADLs/Self Care Home Management;Neuromuscular re-education;Cryotherapy;Electrical Stimulation;Iontophoresis 4mg /ml Dexamethasone;Moist Heat;Traction;Ultrasound;Dry needling;Manual techniques;Therapeutic activities;Therapeutic exercise   PT Next Visit Plan Cont postural strengthening, stretching; continue DN; modalities as indicated    Consulted and Agree with Plan of Care Patient      Patient will benefit from skilled therapeutic intervention in order to improve the following deficits and impairments:  Postural dysfunction, Improper body mechanics, Pain, Decreased range of motion, Decreased mobility, Decreased strength, Increased muscle spasms, Increased fascial restricitons, Decreased activity tolerance  Visit Diagnosis: Radiculopathy, lumbar region  Abnormal posture  Muscle weakness (generalized)     Problem List Patient Active Problem List   Diagnosis Date Noted  . Obstructive uropathy 06/13/2016  . Fatigue 05/16/2016  . GERD (gastroesophageal reflux disease) 05/16/2016  . Obesity 05/16/2016  . Annual physical exam 05/16/2016  . Major depression, chronic 05/16/2016  . Dacryostenosis of right nasolacrimal duct 09/01/2015  . Neck mass 07/28/2015  . Bilateral carpal tunnel syndrome 06/05/2015  . SVT (supraventricular tachycardia) ([facility]) 07/07/2013  . Essential hypertension 05/19/2013  . Hyperlipidemia 05/19/2013  . Atypical chest pain 03/29/2013  . Right lumbar radiculitis 01/26/2013  . Osteoarthritis of right knee 12/29/2012    Triniti Gruetzmacher Nilda Simmer PT, MPH  07/04/2016, 9:26 AM  [facility] [place], [place], [address] Phone: [phone]   Fax:  [phone]  Name: Robert Guerrero MRN: [id number] Date of Birth: [birth year]

## 2016-07-04 NOTE — Patient Instructions (Addendum)
Strengthening: Hip Abduction - Resisted    With tubing around right leg, other side toward anchor, extend leg out from side. Repeat __10__ times per set. Do __2-3__ sets per session. Do __1__ sessions per day. Band above knee   Strengthening: Hip Extension - Resisted    With tubing around right ankle, face anchor and pull leg straight back. Repeat _10___ times per set. Do __2-3__ sets per session. Do __1__ sessions per day. Band above knees   Shallow Squat    Stand with ball between back and wall. Perform a partial squat. Hold 10 sec  Do _2-3__ sets of _10__ repetitions.

## 2016-07-09 ENCOUNTER — Other Ambulatory Visit: Payer: Self-pay | Admitting: Sports Medicine

## 2016-07-09 ENCOUNTER — Ambulatory Visit (HOSPITAL_COMMUNITY): Payer: Medicare HMO

## 2016-07-09 ENCOUNTER — Ambulatory Visit (HOSPITAL_COMMUNITY)
Admission: RE | Admit: 2016-07-09 | Discharge: 2016-07-09 | Disposition: A | Discharge status: Home / Self Care | Payer: Medicare HMO | Source: Ambulatory Visit | Attending: Sports Medicine | Admitting: Sports Medicine

## 2016-07-09 ENCOUNTER — Ambulatory Visit (INDEPENDENT_AMBULATORY_CARE_PROVIDER_SITE_OTHER): Payer: Medicare HMO | Admitting: Physical Therapy

## 2016-07-09 ENCOUNTER — Encounter: Payer: Self-pay | Admitting: Sports Medicine

## 2016-07-09 ENCOUNTER — Ambulatory Visit (INDEPENDENT_AMBULATORY_CARE_PROVIDER_SITE_OTHER): Payer: Medicare HMO | Admitting: Sports Medicine

## 2016-07-09 VITALS — BP 160/86 | HR 70

## 2016-07-09 DIAGNOSIS — F341 Dysthymic disorder: Secondary | ICD-10-CM | POA: Insufficient documentation

## 2016-07-09 DIAGNOSIS — R293 Abnormal posture: Secondary | ICD-10-CM | POA: Diagnosis not present

## 2016-07-09 DIAGNOSIS — M5416 Radiculopathy, lumbar region: Secondary | ICD-10-CM

## 2016-07-09 DIAGNOSIS — M6281 Muscle weakness (generalized): Secondary | ICD-10-CM | POA: Diagnosis not present

## 2016-07-09 DIAGNOSIS — I10 Essential (primary) hypertension: Secondary | ICD-10-CM

## 2016-07-09 DIAGNOSIS — R4781 Slurred speech: Secondary | ICD-10-CM

## 2016-07-09 DIAGNOSIS — F329 Major depressive disorder, single episode, unspecified: Secondary | ICD-10-CM | POA: Diagnosis not present

## 2016-07-09 LAB — COMPLETE METABOLIC PANEL WITH GFR
AST: 28 U/L (ref 10–35)
Albumin: 4.2 g/dL (ref 3.6–5.1)
Alkaline Phosphatase: 89 U/L (ref 40–115)
Calcium: 8.9 mg/dL (ref 8.6–10.3)
GFR, Est Non African American: 64 mL/min (ref >=60)
Glucose, Bld: 81 mg/dL (ref 65–99)
Potassium: 4.2 mmol/L (ref 3.5–5.3)
Sodium: 139 mmol/L (ref 135–146)
Total Bilirubin: 0.6 mg/dL (ref 0.2–1.2)

## 2016-07-09 LAB — COMPLETE METABOLIC PANEL WITHOUT GFR
ALT: 45 U/L (ref 9–46)
BUN: 14 mg/dL (ref 7–25)
CO2: 28 mmol/L (ref 20–31)
Chloride: 105 mmol/L (ref 98–110)
Creat: 1.18 mg/dL (ref 0.70–1.25)
GFR, Est African American: 74 mL/min (ref >=60)
Total Protein: 6.6 g/dL (ref 6.1–8.1)

## 2016-07-09 MED ORDER — GADOBENATE DIMEGLUMINE 529 MG/ML IV SOLN
20.0000 mL | Freq: Once | INTRAVENOUS | Status: AC
  Administered 2016-07-09: 20 mL via INTRAVENOUS

## 2016-07-09 NOTE — Assessment & Plan Note (Signed)
New onset slurred speech with episodes of severe hypertension. Neurologic exam is nonfocal, adding a brain MRI with and without contrast. If there is evidence of a lacunar infarct we will proceed with a full stroke workup.

## 2016-07-09 NOTE — Therapy (Signed)
[facility] [place], [place], [address] Phone: [phone]   Fax:  [phone]  Physical Therapy Treatment  Patient Details  Name: Robert Guerrero MRN: [id number] Date of Birth: 18-Aug-1950 Referring Provider: Dr. Dianah Field  Encounter Date: 07/09/2016      PT End of Session - 07/09/16 0855    Visit Number 9   Number of Visits 12   Date for PT Re-Evaluation 07/11/16   PT Start Time 0849   PT Stop Time 0945   PT Time Calculation (min) 56 min   Activity Tolerance Patient tolerated treatment well;No increased pain   Behavior During Therapy WFL for tasks assessed/performed      Past Medical History:  Diagnosis Date  . Hyperlipidemia   . Hypertension   . Osteoarthritis of right knee   . Right lumbar radiculitis   . SVT (supraventricular tachycardia) ([facility])     No past surgical history on file.  Vitals:   07/09/16 0855  BP: (!) 160/86  Pulse: 70        Subjective Assessment - 07/09/16 0856    Subjective Pt reports he is a little light headed this morning. He states his BP has been a bit of a rollercoaster with medication adjustments.  [place] was [place] [employer] for 2 hours prior to increase in back pain, "I feel this is promising progress".   Pt would like to see MD today before proceeding with additional therapy visits.    Patient Stated Goals eliminate cause of discomfort in the back and get rid of the tightness in the back    Currently in Pain? No/denies   Pain Score 0-No pain            [facility] PT Assessment - 07/09/16 0001      Assessment   Medical Diagnosis Rt lumbar radiculopathy    Referring Provider Dr. Dianah Field   Onset Date/Surgical Date 08/31/15   Hand Dominance Right   Next MD Visit 07/11/16          [facility] Adult PT Treatment/Exercise - 07/09/16 0001      Lumbar Exercises: Stretches   Passive Hamstring Stretch 3 reps;30 seconds   Hip Flexor Stretch 2 reps;60 seconds   sitting one leg dropped off chair knee to floor    Prone on Elbows Stretch 30 seconds;3 reps   Piriformis Stretch 3 reps;30 seconds   Piriformis Stretch Limitations --     Lumbar Exercises: Aerobic   Stationary Bike NuStep L5: 6.5 min (arms/legs)      Lumbar Exercises: Standing   Other Standing Lumbar Exercises hip extension; hip abduction leading with heel 2 sets x 10 - facing wall   green TB for last 10 reps      Lumbar Exercises: Prone   Opposite Arm/Leg Raise Right arm/Left leg;Left arm/Right leg;5 reps;2 seconds   Other Prone Lumbar Exercises axial ext with scap retraction x 10     Moist Heat Therapy   Number Minutes Moist Heat 15 Minutes   Moist Heat Location Lumbar Spine;Hip  bilat hips      Electrical Stimulation   Electrical Stimulation Location Rt hip, lumbar paraspinals.    Electrical Stimulation Action IFC   Electrical Stimulation Parameters to tolerance   Electrical Stimulation Goals Tone                PT Education - 07/09/16 [phone]    Education provided Yes   Education Details HEP - changed piriformis stretch.  Person(s) Educated Patient   Methods Explanation   Comprehension Verbalized understanding;Returned demonstration             PT Long Term Goals - 07/02/16 0854      PT LONG TERM GOAL #1   Title Improve posture and alignment with standing and walking with patient to demonstrate increased hip extension 07/11/16   Time 6   Period Weeks   Status On-going     PT LONG TERM GOAL #2   Title Increase mobility and ROM through bilat hips with patient to demonstrate/report increased movement in LE's for functional activities such as dressing and putting on shoes 07/11/16   Time 6   Period Weeks   Status On-going     PT LONG TERM GOAL #3   Title Increase strength bilat hips to 5-/5 to 5/5 07/11/16   Time 6   Period Weeks   Status On-going     PT LONG TERM GOAL #4   Title Patient tolerates sitting and standing for 30-45 min with minimal  to no increase in pain 07/11/16   Time 6   Period Weeks   Status On-going     PT LONG TERM GOAL #5   Title Independent in HEP 07/11/16   Time 6   Period Weeks   Status On-going     PT LONG TERM GOAL #6   Title Improve FOTO to </= 39% limitation 07/11/16   Time 6   Period Weeks   Status On-going               Plan - 07/09/16 7124    Clinical Impression Statement Pt tolerated all exercises well without increase in pain in low back. Progressively improving symptoms during work hours.  Pt progressing towards goals.    Rehab Potential Good   PT Frequency 2x / week   PT Duration 8 weeks   PT Treatment/Interventions Patient/family education;ADLs/Self Care Home Management;Neuromuscular re-education;Cryotherapy;Electrical Stimulation;Iontophoresis 4mg /ml Dexamethasone;Moist Heat;Traction;Ultrasound;Dry needling;Manual techniques;Therapeutic activities;Therapeutic exercise   PT Next Visit Plan Assess progress towards goals; FOTO - end of POC.    Consulted and Agree with Plan of Care Patient      Patient will benefit from skilled therapeutic intervention in order to improve the following deficits and impairments:  Postural dysfunction, Improper body mechanics, Pain, Decreased range of motion, Decreased mobility, Decreased strength, Increased muscle spasms, Increased fascial restricitons, Decreased activity tolerance  Visit Diagnosis: Radiculopathy, lumbar region  Abnormal posture  Muscle weakness (generalized)     Problem List Patient Active Problem List   Diagnosis Date Noted  . Obstructive uropathy 06/13/2016  . Fatigue 05/16/2016  . GERD (gastroesophageal reflux disease) 05/16/2016  . Obesity 05/16/2016  . Annual physical exam 05/16/2016  . Major depression, chronic 05/16/2016  . Dacryostenosis of right nasolacrimal duct 09/01/2015  . Neck mass 07/28/2015  . Bilateral carpal tunnel syndrome 06/05/2015  . SVT (supraventricular tachycardia) ([place]) 07/07/2013  .  Essential hypertension 05/19/2013  . Hyperlipidemia 05/19/2013  . Atypical chest pain 03/29/2013  . Right lumbar radiculitis 01/26/2013  . Osteoarthritis of right knee 12/29/2012   Kerin Perna, PTA 07/09/16 9:44 AM  [facility] [place], [place], [address] Phone: [phone]   Fax:  [phone]  Name: Robert Guerrero MRN: [id number] Date of Birth: April 11, 1950

## 2016-07-09 NOTE — Patient Instructions (Addendum)
Piriformis Stretch, Supine    Lie supine, one ankle crossed onto opposite knee. Holding bottom leg behind knee, gently pull legs toward chest until stretch is felt in buttock of top leg. Hold _30__ seconds. For deeper stretch gently push top knee away from body.  Repeat _2-3__ times per session. Do _2__ sessions per day.   [facility] [place], [place] [address]  [phone] (office) [phone] (fax)

## 2016-07-09 NOTE — Assessment & Plan Note (Signed)
Unfortunately he only took his Zoloft for a couple of days. He is making very difficult to treat his depression because he only takes the medication briefly. He does desire to go back to Wellbutrin at the low-dose.

## 2016-07-09 NOTE — Assessment & Plan Note (Signed)
Continue valsartan 320 and amlodipine 5, he does have several blood pressure readings at home in the 120s over 70s, and some in the 150s over 100. No changes in blood pressure medication just yet.

## 2016-07-09 NOTE — Progress Notes (Signed)
  Subjective:    CC: Follow-up  HPI: Hypertension: Looked at his home blood pressure readings, some were really good, some more still high. Headaches, visual changes. I did note some mild slurred speech over the past several days, he doesn't have any focal or localizing neurologic symptoms though. No chest pain.  Depression: Didn't take sertraline for more than 2 days, desires to go back onto Wellbutrin.  Past medical history:  Negative.  See flowsheet/record as well for more information.  Surgical history: Negative.  See flowsheet/record as well for more information.  Family history: Negative.  See flowsheet/record as well for more information.  Social history: Negative.  See flowsheet/record as well for more information.  Allergies, and medications have been entered into the medical record, reviewed, and no changes needed.   Review of Systems: No fevers, chills, night sweats, weight loss, chest pain, or shortness of breath.   Objective:    General: Well Developed, well nourished, and in no acute distress.  Neuro: Alert and oriented x3, extra-ocular muscles intact, sensation grossly intact.  HEENT: Normocephalic, atraumatic, pupils equal round reactive to light, neck supple, no masses, no lymphadenopathy, thyroid nonpalpable.  Skin: Warm and dry, no rashes. Cardiac: Regular rate and rhythm, no murmurs rubs or gallops, no lower extremity edema.  Respiratory: Clear to auscultation bilaterally. Not using accessory muscles, speaking in full sentences.  Impression and Recommendations:    Essential hypertension Continue valsartan 320 and amlodipine 5, he does have several blood pressure readings at home in the 120s over 70s, and some in the 150s over 100. No changes in blood pressure medication just yet.  Major depression, chronic Unfortunately he only took his Zoloft for a couple of days. He is making very difficult to treat his depression because he only takes the medication  briefly. He does desire to go back to Wellbutrin at the low-dose.  Slurred speech New onset slurred speech with episodes of severe hypertension. Neurologic exam is nonfocal, adding a brain MRI with and without contrast. If there is evidence of a lacunar infarct we will proceed with a full stroke workup.  I spent 40 minutes with this patient, greater than 50% was face-to-face time counseling regarding the above diagnoses

## 2016-07-10 ENCOUNTER — Ambulatory Visit (HOSPITAL_COMMUNITY)
Admission: RE | Admit: 2016-07-10 | Discharge: 2016-07-10 | Disposition: A | Discharge status: Home / Self Care | Payer: Medicare HMO | Source: Ambulatory Visit | Attending: Sports Medicine | Admitting: Sports Medicine

## 2016-07-10 ENCOUNTER — Other Ambulatory Visit: Payer: Self-pay | Admitting: Sports Medicine

## 2016-07-10 DIAGNOSIS — R4781 Slurred speech: Secondary | ICD-10-CM

## 2016-07-11 ENCOUNTER — Ambulatory Visit: Payer: Commercial Managed Care - HMO | Admitting: Sports Medicine

## 2016-07-16 ENCOUNTER — Encounter: Payer: Self-pay | Admitting: Rehabilitative and Restorative Service Providers"

## 2016-07-16 ENCOUNTER — Ambulatory Visit (INDEPENDENT_AMBULATORY_CARE_PROVIDER_SITE_OTHER): Payer: Medicare HMO | Admitting: Rehabilitative and Restorative Service Providers"

## 2016-07-16 DIAGNOSIS — R293 Abnormal posture: Secondary | ICD-10-CM

## 2016-07-16 DIAGNOSIS — M6281 Muscle weakness (generalized): Secondary | ICD-10-CM | POA: Diagnosis not present

## 2016-07-16 DIAGNOSIS — M5416 Radiculopathy, lumbar region: Secondary | ICD-10-CM

## 2016-07-16 NOTE — Therapy (Addendum)
[address] [place], [place], [address] Phone: [phone]   Fax:  [phone]  Physical Therapy Treatment  Patient Details  Name: Robert Guerrero MRN: [id number] Date of Birth: Aug 23, 1950 Referring Provider: Dr Dwana Melena  Encounter Date: 07/16/2016      PT End of Session - 07/16/16 0936    Visit Number 10   Number of Visits (P)  24   Date for PT Re-Evaluation (P)  08/27/16   PT Start Time 2585   PT Stop Time 1029   PT Time Calculation (min) 56 min   Activity Tolerance Patient tolerated treatment well      Past Medical History:  Diagnosis Date  . Hyperlipidemia   . Hypertension   . Osteoarthritis of right knee   . Right lumbar radiculitis   . SVT (supraventricular tachycardia) ([facility] [place])     History reviewed. No pertinent surgical history.  There were no vitals filed for this visit.      Subjective Assessment - 07/16/16 0936    Subjective Patient reports that he was very active over the weekend working in the yard and house with some strenous activities and tasks requiring a lot of awkward positions as well as some lifting and bending. He did better than he thought he would. The problem is still the prolonged standing.    Currently in Pain? No/denies   Pain Descriptors / Indicators Sore            [facility] PT Assessment - 07/16/16 0001      Assessment   Medical Diagnosis Rt lumbar radiculopathy    Referring Provider Dr Dwana Melena   Onset Date/Surgical Date 08/31/15   Hand Dominance Right   Next MD Visit 4/18     Observation/Other Assessments   Focus on Therapeutic Outcomes (FOTO)  28% limitation      Posture/Postural Control   Posture Comments forward flexed at trunk and hips     AROM   Lumbar Flexion 100%  pain free   Lumbar Extension 10%  stiff/discomfort    Lumbar - Right Side Bend 75%   Lumbar - Left Side Bend 75%   Lumbar - Right Rotation 35%   Lumbar - Left Rotation 40%      Strength   Right Hip Flexion 5/5   Right Hip Extension 5/5   Right Hip ABduction 5/5   Left Hip Flexion 5/5   Left Hip Extension --  5-/5   Left Hip ABduction --  5-/5     Flexibility   Soft Tissue Assessment /Muscle Length --  tightness bilat hip flexors   Hamstrings 85 deg Rt/80 deg Lt    Quadriceps tight ~ 100 deg bialt    ITB tight Rt > Lt      Palpation   Palpation comment tight/tender Rt posterior hip/piriformis/gluteals                      [facility] Adult PT Treatment/Exercise - 07/16/16 0001      Lumbar Exercises: Stretches   Press Ups --  10 reps x 2 sec hold      Lumbar Exercises: Aerobic   Stationary Bike NuStep L5: 6.5 min (arms/legs)      Lumbar Exercises: Standing   Other Standing Lumbar Exercises hip extension; hip abduction leading with heel 2 sets x 10 - facing wall   green TB for last 10 reps      Lumbar Exercises: Sidelying   Hip Abduction 20 reps  each side      Moist Heat Therapy   Number Minutes Moist Heat 20 Minutes   Moist Heat Location Lumbar Spine;Hip  bilat hips      Electrical Stimulation   Electrical Stimulation Location Rt hip, lumbar paraspinals.    Electrical Stimulation Action IFC  8 min etim with DN Rt piriformis/hip abd    Electrical Stimulation Parameters to tolerance   Electrical Stimulation Goals Tone;Pain          Trigger Point Dry Needling - 07/16/16 1007    Consent Given? Yes   Muscles Treated Lower Body --  Rt hip DN gluteus medius - palpable decrease in tightness    Gluteus Maximus Response Palpable increased muscle length   Gluteus Minimus Response Palpable increased muscle length   Piriformis Response Palpable increased muscle length      DN followed by manual work Rt hip area then modalities              PT Long Term Goals - 07/16/16 0935      PT LONG TERM GOAL #1   Title Improve posture and alignment with standing and walking with patient to demonstrate increased hip extension  08/27/16   Time 12   Period Weeks   Status Revised     PT LONG TERM GOAL #2   Title Increase mobility and ROM through bilat hips with patient to demonstrate/report increased movement in LE's for functional activities such as dressing and putting on shoes 08/27/16   Time 12   Period Weeks   Status Revised     PT LONG TERM GOAL #3   Title Increase strength bilat hips to 5-/5 to 5/5 with manual muscle testing 08/27/16   Time 12   Period Weeks   Status Revised     PT LONG TERM GOAL #4   Title Patient tolerates sitting and standing for 30-45 min with minimal to no increase in pain 07/2916   Time 12   Period Weeks   Status Revised     PT LONG TERM GOAL #5   Title Independent in HEP 08/27/16   Time 12   Period Weeks   Status Revised     PT LONG TERM GOAL #6   Title Improve FOTO to </= 39% limitation 08/27/16   Time 12   Period Weeks   Status Revised               Plan - 07/16/16 1008    Clinical Impression Statement Patient reports improved endurance for functional tasks. Feels he is gaining strength in his core and when the core feels stronger his legs feel stronger. Still has pain with prolonged standing but can decrease pain with rest and stretch. Objectively patient demonstrates improved mobilty in trunk and LE's;, increased LE strength; decresaed palpable tightness. He is progressing toward stated goals of therapy. Patient will benefit from PT to address deficits and work toward maximum rehab potential.    Rehab Potential Good   PT Frequency 2x / week   PT Duration 8 weeks   PT Treatment/Interventions Patient/family education;ADLs/Self Care Home Management;Neuromuscular re-education;Cryotherapy;Electrical Stimulation;Iontophoresis 4mg /ml Dexamethasone;Moist Heat;Traction;Ultrasound;Dry needling;Manual techniques;Therapeutic activities;Therapeutic exercise   PT Next Visit Plan continue rehab with focus on stretching hip flexors; core stabilization; strengthening; DN; manual  work; modalities as indicated.    Consulted and Agree with Plan of Care Patient      Patient will benefit from skilled therapeutic intervention in order to improve the following deficits and impairments:  Postural dysfunction,  Improper body mechanics, Pain, Decreased range of motion, Decreased mobility, Decreased strength, Increased muscle spasms, Increased fascial restricitons, Decreased activity tolerance  Visit Diagnosis: Radiculopathy, lumbar region - Plan: PT plan of care cert/re-cert  Abnormal posture - Plan: PT plan of care cert/re-cert  Muscle weakness (generalized) - Plan: PT plan of care cert/re-cert     Problem List Patient Active Problem List   Diagnosis Date Noted  . Slurred speech 07/09/2016  . Obstructive uropathy 06/13/2016  . Fatigue 05/16/2016  . GERD (gastroesophageal reflux disease) 05/16/2016  . Obesity 05/16/2016  . Annual physical exam 05/16/2016  . Major depression, chronic 05/16/2016  . Dacryostenosis of right nasolacrimal duct 09/01/2015  . Neck mass 07/28/2015  . Bilateral carpal tunnel syndrome 06/05/2015  . SVT (supraventricular tachycardia) ([facility]) 07/07/2013  . Essential hypertension 05/19/2013  . Hyperlipidemia 05/19/2013  . Atypical chest pain 03/29/2013  . Right lumbar radiculitis 01/26/2013  . Osteoarthritis of right knee 12/29/2012    Tyreck Bell Nilda Simmer PT, MPH  07/16/2016, 1:27 PM  [facility] [place], [place], [address] Phone: [phone]   Fax:  [phone]  Name: Dorien Mayotte MRN: [id number] Date of Birth: 01-Sep-1950

## 2016-07-18 ENCOUNTER — Ambulatory Visit (INDEPENDENT_AMBULATORY_CARE_PROVIDER_SITE_OTHER): Payer: Medicare HMO | Admitting: Rehabilitative and Restorative Service Providers"

## 2016-07-18 ENCOUNTER — Encounter: Payer: Self-pay | Admitting: Rehabilitative and Restorative Service Providers"

## 2016-07-18 DIAGNOSIS — M5416 Radiculopathy, lumbar region: Secondary | ICD-10-CM | POA: Diagnosis not present

## 2016-07-18 DIAGNOSIS — M6281 Muscle weakness (generalized): Secondary | ICD-10-CM | POA: Diagnosis not present

## 2016-07-18 DIAGNOSIS — R293 Abnormal posture: Secondary | ICD-10-CM

## 2016-07-18 NOTE — Therapy (Signed)
[address] [place], [place], [address] Phone: [phone]   Fax:  [phone]  Physical Therapy Treatment  Patient Details  Name: Robert Guerrero MRN: [id number] Date of Birth: [birth year] Referring Provider: Dr Dwana Melena  Encounter Date: 07/18/2016      PT End of Session - 07/18/16 0853    Visit Number 11   Number of Visits 24   Date for PT Re-Evaluation 08/28/26   PT Start Time 0846   PT Stop Time 0940   PT Time Calculation (min) 54 min   Activity Tolerance Patient tolerated treatment well      Past Medical History:  Diagnosis Date  . Hyperlipidemia   . Hypertension   . Osteoarthritis of right knee   . Right lumbar radiculitis   . SVT (supraventricular tachycardia) ([place])     History reviewed. No pertinent surgical history.  There were no vitals filed for this visit.      Subjective Assessment - 07/18/16 0854    Subjective [name] that he did very well after the DN. He could feel the bottom of his feet when he left. The e-stim with the DN was helpful - he could feel a big difference after treatment. Would like to continue with PT   Currently in Pain? Yes   Pain Score 2    Pain Location Hip   Pain Orientation Right   Pain Descriptors / Indicators Tightness;Sore   Pain Type Chronic pain                         [facility] Adult PT Treatment/Exercise - 07/18/16 0001      Lumbar Exercises: Aerobic   Stationary Bike NuStep L5: 6 min (arms/legs)      Lumbar Exercises: Standing   Other Standing Lumbar Exercises hip extension; hip abduction leading with heel 2 sets x 10 - facing counter; hip abduction w/ red TB above knees 10 reps x 2 sets facing counter  green TB for last 10 reps    Other Standing Lumbar Exercises side steps w/red TB above knees 15 ft x 6 reps      Moist Heat Therapy   Number Minutes Moist Heat 20 Minutes   Moist Heat Location Lumbar Spine;Hip  bilat hips       Electrical Stimulation   Electrical Stimulation Location Rt hip, lumbar paraspinals.    Electrical Stimulation Action IFC   Electrical Stimulation Parameters to Dentist Goals Tone;Pain     Manual Therapy   Manual therapy comments pt prone   Soft tissue mobilization working trhrough lumbar paraspinals; QL; hip abductors including piriformis and glut med/max    Myofascial Release lumbar/hip Rt           Trigger Point Dry Needling - 07/18/16 5701    Consent Given? Yes   Muscles Treated Lower Body --  Rt    Longissimus Response Palpable increased muscle length   Gluteus Maximus Response Palpable increased muscle length   Gluteus Minimus Response Palpable increased muscle length   Piriformis Response Palpable increased muscle length                   PT Long Term Goals - 07/16/16 0935      PT LONG TERM GOAL #1   Title Improve posture and alignment with standing and walking with patient to demonstrate increased hip extension 08/27/16   Time 12   Period Weeks  Status Revised     PT LONG TERM GOAL #2   Title Increase mobility and ROM through bilat hips with patient to demonstrate/report increased movement in LE's for functional activities such as dressing and putting on shoes 08/27/16   Time 12   Period Weeks   Status Revised     PT LONG TERM GOAL #3   Title Increase strength bilat hips to 5-/5 to 5/5 with manual muscle testing 08/27/16   Time 12   Period Weeks   Status Revised     PT LONG TERM GOAL #4   Title Patient tolerates sitting and standing for 30-45 min with minimal to no increase in pain 07/2916   Time 12   Period Weeks   Status Revised     PT LONG TERM GOAL #5   Title Independent in HEP 08/27/16   Time 12   Period Weeks   Status Revised     PT LONG TERM GOAL #6   Title Improve FOTO to </= 39% limitation 08/27/16   Time 12   Period Weeks   Status Revised               Plan - 07/18/16 0857    Clinical  Impression Statement Excellent response to DN with estim. Working on core stabilization and hip strength. Progressing well toward goals of therapy. Anticipate slower progress due to long standing nature of symptoms.    Rehab Potential Good   PT Frequency 2x / week   PT Duration 12 weeks   PT Treatment/Interventions Patient/family education;ADLs/Self Care Home Management;Neuromuscular re-education;Cryotherapy;Electrical Stimulation;Iontophoresis 4mg /ml Dexamethasone;Moist Heat;Traction;Ultrasound;Dry needling;Manual techniques;Therapeutic activities;Therapeutic exercise   PT Next Visit Plan continue rehab with focus on stretching hip flexors; core stabilization; strengthening; DN; manual work; modalities as indicated.    Consulted and Agree with Plan of Care Patient      Patient will benefit from skilled therapeutic intervention in order to improve the following deficits and impairments:  Postural dysfunction, Improper body mechanics, Pain, Decreased range of motion, Decreased mobility, Decreased strength, Increased muscle spasms, Increased fascial restricitons, Decreased activity tolerance  Visit Diagnosis: Radiculopathy, lumbar region  Abnormal posture  Muscle weakness (generalized)     Problem List Patient Active Problem List   Diagnosis Date Noted  . Slurred speech 07/09/2016  . Obstructive uropathy 06/13/2016  . Fatigue 05/16/2016  . GERD (gastroesophageal reflux disease) 05/16/2016  . Obesity 05/16/2016  . Annual physical exam 05/16/2016  . Major depression, chronic 05/16/2016  . Dacryostenosis of right nasolacrimal duct 09/01/2015  . Neck mass 07/28/2015  . Bilateral carpal tunnel syndrome 06/05/2015  . SVT (supraventricular tachycardia) ([place]) 07/07/2013  . Essential hypertension 05/19/2013  . Hyperlipidemia 05/19/2013  . Atypical chest pain 03/29/2013  . Right lumbar radiculitis 01/26/2013  . Osteoarthritis of right knee 12/29/2012    Robert Guerrero Nilda Simmer PT, MPH   07/18/2016, 9:24 AM  [facility] [place], [place], [address] Phone: [phone]   Fax:  [phone]  Name: Robert Guerrero MRN: [id number] Date of Birth: 1950-12-05

## 2016-07-23 ENCOUNTER — Ambulatory Visit (INDEPENDENT_AMBULATORY_CARE_PROVIDER_SITE_OTHER): Payer: Medicare HMO | Admitting: Rehabilitative and Restorative Service Providers"

## 2016-07-23 DIAGNOSIS — M5416 Radiculopathy, lumbar region: Secondary | ICD-10-CM

## 2016-07-23 DIAGNOSIS — M6281 Muscle weakness (generalized): Secondary | ICD-10-CM

## 2016-07-23 DIAGNOSIS — R293 Abnormal posture: Secondary | ICD-10-CM

## 2016-07-23 NOTE — Therapy (Signed)
[place] [address] [place], [place], [address] Phone: [phone]   Fax:  [phone]  Physical Therapy Treatment  Patient Details  Name: Robert Guerrero MRN: [id number] Date of Birth: Aug 20, 1950 Referring Provider: Dr Dianah Field  Encounter Date: 07/23/2016      PT End of Session - 07/23/16 0848    Visit Number 12   Number of Visits 24   Date for PT Re-Evaluation 08/28/26   PT Start Time 0843   PT Stop Time 0940   PT Time Calculation (min) 57 min   Activity Tolerance Patient tolerated treatment well      Past Medical History:  Diagnosis Date  . Hyperlipidemia   . Hypertension   . Osteoarthritis of right knee   . Right lumbar radiculitis   . SVT (supraventricular tachycardia) ([facility])     No past surgical history on file.  There were no vitals filed for this visit.      Subjective Assessment - 07/23/16 0853    Subjective [name] excellent results with DN with estim. He was able to work in his restrauant anf his garden for ~ 3 hours before pain began. Good progress. Pleased.    Currently in Pain? No/denies            [facility] PT Assessment - 07/23/16 0001      Assessment   Medical Diagnosis Rt lumbar radiculopathy    Referring Provider Dr Dianah Field   Onset Date/Surgical Date 08/31/15   Hand Dominance Right   Next MD Visit 4/18     Posture/Postural Control   Posture Comments forward flexed at trunk and hips     AROM   Lumbar Extension 15%  stiff/discomfort      Strength   Right Hip Extension --  5-/5   Left Hip Extension --  5-/5     Palpation   Palpation comment tight/tender Rt posterior hip/piriformis/gluteals                      [facility] Adult PT Treatment/Exercise - 07/23/16 0001      Lumbar Exercises: Aerobic   Stationary Bike NuStep L5: 6 min (arms/legs)      Lumbar Exercises: Standing   Row Strengthening;Both  40-50 reps    Theraband Level (Row) Level 4  (Blue)   Shoulder Extension Strengthening;Both  40-50 reps    Theraband Level (Shoulder Extension) Level 4 (Blue)   Other Standing Lumbar Exercises hip extension; hip abduction leading with heel 2 sets x 10 - facing counter; hip abduction w/ red TB above knees 10 reps x 2 sets facing counter  green TB for last 10 reps    Other Standing Lumbar Exercises step ups x 40-50 reps each LE; shallow knee bend with row x 40      Moist Heat Therapy   Number Minutes Moist Heat 20 Minutes   Moist Heat Location Lumbar Spine;Hip  bilat hips      Electrical Stimulation   Electrical Stimulation Location Rt hip, lumbar paraspinals.    Electrical Stimulation Action IFC and estim with DN    Electrical Stimulation Parameters to tolerance   Electrical Stimulation Goals Tone;Pain     Manual Therapy   Manual therapy comments pt prone   Soft tissue mobilization working through lumbar paraspinals; QL; hip abductors including piriformis and glut med/max    Myofascial Release lumbar/hip Rt           Trigger Point Dry Needling - 07/23/16 2694  Consent Given? Yes   Muscles Treated Lower Body --  Rt   Longissimus Response Palpable increased muscle length   Gluteus Maximus Response Palpable increased muscle length   Gluteus Minimus Response Palpable increased muscle length   Piriformis Response Palpable increased muscle length                   PT Long Term Goals - 07/23/16 0847      PT LONG TERM GOAL #1   Title Improve posture and alignment with standing and walking with patient to demonstrate increased hip extension 08/27/16   Time 12   Period Weeks   Status On-going     PT LONG TERM GOAL #2   Title Increase mobility and ROM through bilat hips with patient to demonstrate/report increased movement in LE's for functional activities such as dressing and putting on shoes 08/27/16   Time 12   Period Weeks   Status On-going     PT LONG TERM GOAL #3   Title Increase strength bilat hips to  5-/5 to 5/5 with manual muscle testing 08/27/16   Time 12   Period Weeks   Status On-going     PT LONG TERM GOAL #4   Title Patient tolerates sitting and standing for 30-45 min with minimal to no increase in pain 07/2916   Time 12   Period Weeks   Status On-going     PT LONG TERM GOAL #5   Title Independent in HEP 08/27/16   Time 12   Period Weeks   Status On-going     PT LONG TERM GOAL #6   Title Improve FOTO to </= 39% limitation 08/27/16   Time 12   Period Weeks   Status On-going               Plan - 07/23/16 0849    Clinical Impression Statement Continues to respond well to DN - improved result with e-stim with the needling. Patient reports less pain and improved functional endurance before onset of pain. Posture improving with exercise. Less palpable tightness noted through the Rt hip posteriorly. Patient demonstrates good progress toward stated goals of therapy. He will benefit from continued therapy to achieve maximum rehab potential.    Rehab Potential Good   PT Frequency 2x / week   PT Duration 12 weeks   PT Treatment/Interventions Patient/family education;ADLs/Self Care Home Management;Neuromuscular re-education;Cryotherapy;Electrical Stimulation;Iontophoresis 4mg /ml Dexamethasone;Moist Heat;Traction;Ultrasound;Dry needling;Manual techniques;Therapeutic activities;Therapeutic exercise   PT Next Visit Plan continue rehab with focus on stretching hip flexors; core stabilization; strengthening; DN; manual work; modalities as indicated.    Consulted and Agree with Plan of Care Patient      Patient will benefit from skilled therapeutic intervention in order to improve the following deficits and impairments:  Postural dysfunction, Improper body mechanics, Pain, Decreased range of motion, Decreased mobility, Decreased strength, Increased muscle spasms, Increased fascial restricitons, Decreased activity tolerance  Visit Diagnosis: Radiculopathy, lumbar region  Abnormal  posture  Muscle weakness (generalized)     Problem List Patient Active Problem List   Diagnosis Date Noted  . Slurred speech 07/09/2016  . Obstructive uropathy 06/13/2016  . Fatigue 05/16/2016  . GERD (gastroesophageal reflux disease) 05/16/2016  . Obesity 05/16/2016  . Annual physical exam 05/16/2016  . Major depression, chronic 05/16/2016  . Dacryostenosis of right nasolacrimal duct 09/01/2015  . Neck mass 07/28/2015  . Bilateral carpal tunnel syndrome 06/05/2015  . SVT (supraventricular tachycardia) ([place]) 07/07/2013  . Essential hypertension 05/19/2013  . Hyperlipidemia 05/19/2013  .  Atypical chest pain 03/29/2013  . Right lumbar radiculitis 01/26/2013  . Osteoarthritis of right knee 12/29/2012    Didier Brandenburg Nilda Simmer PT, MPH  07/23/2016, 9:20 AM  [facility] [place], [place], [address] Phone: [phone]   Fax:  [phone]  Name: Ravinder Lukehart MRN: [id number] Date of Birth: March 23, 1951

## 2016-07-25 ENCOUNTER — Ambulatory Visit (INDEPENDENT_AMBULATORY_CARE_PROVIDER_SITE_OTHER): Payer: Medicare HMO | Admitting: Rehabilitative and Restorative Service Providers"

## 2016-07-25 ENCOUNTER — Encounter: Payer: Self-pay | Admitting: Rehabilitative and Restorative Service Providers"

## 2016-07-25 DIAGNOSIS — M6281 Muscle weakness (generalized): Secondary | ICD-10-CM

## 2016-07-25 DIAGNOSIS — R293 Abnormal posture: Secondary | ICD-10-CM

## 2016-07-25 DIAGNOSIS — M5416 Radiculopathy, lumbar region: Secondary | ICD-10-CM | POA: Diagnosis not present

## 2016-07-25 NOTE — Therapy (Signed)
[place], [place], [address] Phone: [phone]   Fax:  [phone]  Physical Therapy Treatment  Patient Details  Name: Robert Guerrero MRN: [id number] Date of Birth: [birth year] Referring Provider: Dr Dianah Field  Encounter Date: 07/25/2016      PT End of Session - 07/25/16 0850    Visit Number 13   Number of Visits 24   Date for PT Re-Evaluation 08/28/26   PT Start Time 0844   PT Stop Time 0942   PT Time Calculation (min) 58 min   Activity Tolerance Patient tolerated treatment well      Past Medical History:  Diagnosis Date  . Hyperlipidemia   . Hypertension   . Osteoarthritis of right knee   . Right lumbar radiculitis   . SVT (supraventricular tachycardia) ([place])     History reviewed. No pertinent surgical history.  There were no vitals filed for this visit.      Subjective Assessment - 07/25/16 0852    Subjective [name] continued excellent results with DN with estim. Good progress. Feels the DN is very helpful. Working on some exercises at home.   Currently in Pain? No/denies   Pain Descriptors / Indicators Tightness  stiffness                         [facility] Adult PT Treatment/Exercise - 07/25/16 0001      Lumbar Exercises: Aerobic   Stationary Bike NuStep L5: 6 min (arms/legs)      Lumbar Exercises: Standing   Other Standing Lumbar Exercises hip extension; hip abduction leading with heel 2 sets x 10 - facing counter; hip abduction w/ red TB above knees 10 reps x 2 sets facing counter; back at wall engaging core and extending hips lifting buttocks from wall x 10    Other Standing Lumbar Exercises step ups forward and side x 40-50 reps each LE; shallow knee bend with row x 20; added greenb TB x 30     Lumbar Exercises: Sidelying   Hip Abduction 20 reps  hip extended leading with heel      Moist Heat Therapy   Number Minutes Moist Heat 20 Minutes   Moist  Heat Location Lumbar Spine;Hip  bilat hips      Electrical Stimulation   Electrical Stimulation Location Rt hip, lumbar paraspinals.    Electrical Stimulation Action IFC and estim with DN   Electrical Stimulation Parameters to tolerance   Electrical Stimulation Goals Tone;Pain     Manual Therapy   Manual therapy comments pt prone   Soft tissue mobilization working through lumbar paraspinals; QL; hip abductors including piriformis and glut med/max    Myofascial Release lumbar/hip Rt           Trigger Point Dry Needling - 07/25/16 7824    Consent Given? Yes   Muscles Treated Lower Body --  mid back to Rt hip    Longissimus Response Palpable increased muscle length   Gluteus Maximus Response Palpable increased muscle length   Gluteus Minimus Response Palpable increased muscle length   Piriformis Response Palpable increased muscle length                   PT Long Term Goals - 07/23/16 0847      PT LONG TERM GOAL #1   Title Improve posture and alignment with standing and walking with patient to demonstrate increased hip extension 08/27/16   Time 12  Period Weeks   Status On-going     PT LONG TERM GOAL #2   Title Increase mobility and ROM through bilat hips with patient to demonstrate/report increased movement in LE's for functional activities such as dressing and putting on shoes 08/27/16   Time 12   Period Weeks   Status On-going     PT LONG TERM GOAL #3   Title Increase strength bilat hips to 5-/5 to 5/5 with manual muscle testing 08/27/16   Time 12   Period Weeks   Status On-going     PT LONG TERM GOAL #4   Title Patient tolerates sitting and standing for 30-45 min with minimal to no increase in pain 07/2916   Time 12   Period Weeks   Status On-going     PT LONG TERM GOAL #5   Title Independent in HEP 08/27/16   Time 12   Period Weeks   Status On-going     PT LONG TERM GOAL #6   Title Improve FOTO to </= 39% limitation 08/27/16   Time 12   Period  Weeks   Status On-going               Plan - 07/25/16 0850    Clinical Impression Statement Good response to DN and core work. Patient reports decreased pain through the Rt hip and improved tolerance for functional activities.    Rehab Potential Good   PT Frequency 2x / week   PT Duration 12 weeks   PT Treatment/Interventions Patient/family education;ADLs/Self Care Home Management;Neuromuscular re-education;Cryotherapy;Electrical Stimulation;Iontophoresis 4mg /ml Dexamethasone;Moist Heat;Traction;Ultrasound;Dry needling;Manual techniques;Therapeutic activities;Therapeutic exercise   PT Next Visit Plan continue rehab with focus on stretching hip flexors; core stabilization; strengthening; DN; manual work; modalities as indicated.    Consulted and Agree with Plan of Care Patient      Patient will benefit from skilled therapeutic intervention in order to improve the following deficits and impairments:  Postural dysfunction, Improper body mechanics, Pain, Decreased range of motion, Decreased mobility, Decreased strength, Increased muscle spasms, Increased fascial restricitons, Decreased activity tolerance  Visit Diagnosis: Radiculopathy, lumbar region  Abnormal posture  Muscle weakness (generalized)     Problem List Patient Active Problem List   Diagnosis Date Noted  . Slurred speech 07/09/2016  . Obstructive uropathy 06/13/2016  . Fatigue 05/16/2016  . GERD (gastroesophageal reflux disease) 05/16/2016  . Obesity 05/16/2016  . Annual physical exam 05/16/2016  . Major depression, chronic 05/16/2016  . Dacryostenosis of right nasolacrimal duct 09/01/2015  . Neck mass 07/28/2015  . Bilateral carpal tunnel syndrome 06/05/2015  . SVT (supraventricular tachycardia) ([place]) 07/07/2013  . Essential hypertension 05/19/2013  . Hyperlipidemia 05/19/2013  . Atypical chest pain 03/29/2013  . Right lumbar radiculitis 01/26/2013  . Osteoarthritis of right knee 12/29/2012    Lynx Goodrich Nilda Simmer PT, MPH  07/25/2016, 9:26 AM  [facility] [place] [facility] [place], [place], 04/10/1950 Phone: [phone]   Fax:  [phone]  Name: Robert Guerrero MRN: [id number] Date of Birth: [birth year]

## 2016-07-29 ENCOUNTER — Ambulatory Visit (INDEPENDENT_AMBULATORY_CARE_PROVIDER_SITE_OTHER): Payer: Medicare HMO | Admitting: Sports Medicine

## 2016-07-29 ENCOUNTER — Encounter: Payer: Self-pay | Admitting: Sports Medicine

## 2016-07-29 DIAGNOSIS — F341 Dysthymic disorder: Secondary | ICD-10-CM | POA: Diagnosis not present

## 2016-07-29 DIAGNOSIS — M5416 Radiculopathy, lumbar region: Secondary | ICD-10-CM | POA: Diagnosis not present

## 2016-07-29 DIAGNOSIS — I10 Essential (primary) hypertension: Secondary | ICD-10-CM

## 2016-07-29 DIAGNOSIS — F329 Major depressive disorder, single episode, unspecified: Secondary | ICD-10-CM

## 2016-07-29 LAB — FECAL OCCULT BLOOD, GUAIAC: Fecal Occult Blood: NEGATIVE

## 2016-07-29 NOTE — Assessment & Plan Note (Signed)
There is some element of seasonal affective disorder, he will continue his Wellbutrin just once daily for now. I told him he can discontinue this if he feels he no longer needs it.

## 2016-07-29 NOTE — Progress Notes (Signed)
  Subjective:    CC: Follow-up  HPI: Hypertension: Controlled.  Depression: Much better on Wellbutrin, things it has more of a seasonal affective component and feels better since the weather has been improving.   Lumbar radiculopathy: Continues to improve with aggressive physical therapy   Past medical history:  Negative.  See flowsheet/record as well for more information.  Surgical history: Negative.  See flowsheet/record as well for more information.  Family history: Negative.  See flowsheet/record as well for more information.  Social history: Negative.  See flowsheet/record as well for more information.  Allergies, and medications have been entered into the medical record, reviewed, and no changes needed.   Review of Systems: No fevers, chills, night sweats, weight loss, chest pain, or shortness of breath.   Objective:    General: Well Developed, well nourished, and in no acute distress.  Neuro: Alert and oriented x3, extra-ocular muscles intact, sensation grossly intact.  HEENT: Normocephalic, atraumatic, pupils equal round reactive to light, neck supple, no masses, no lymphadenopathy, thyroid nonpalpable.  Skin: Warm and dry, no rashes. Cardiac: Regular rate and rhythm, no murmurs rubs or gallops, no lower extremity edema.  Respiratory: Clear to auscultation bilaterally. Not using accessory muscles, speaking in full sentences.  Impression and Recommendations:    Major depression, chronic There is some element of seasonal affective disorder, he will continue his Wellbutrin just once daily for now. I told him he can discontinue this if he feels he no longer needs it.  Essential hypertension Now well controlled, no changes.  Right lumbar radiculitis First epidural did well, facet injections and second epidural did not. He is actually doing extremely well with physical therapy, postural training and dry needling. Lending to the concept of exercise is medicine, we will work  more aggressively on weight loss and physical therapy.  I spent 25 minutes with this patient, greater than 50% was face-to-face time counseling regarding the above diagnoses

## 2016-07-29 NOTE — Assessment & Plan Note (Addendum)
First epidural did well, facet injections and second epidural did not. He is actually doing extremely well with physical therapy, postural training and dry needling. Lending to the concept of exercise is medicine, we will work more aggressively on weight loss and physical therapy.

## 2016-07-29 NOTE — Assessment & Plan Note (Signed)
Now well controlled, no changes.

## 2016-07-30 ENCOUNTER — Encounter: Payer: Medicare HMO | Admitting: Rehabilitative and Restorative Service Providers"

## 2016-08-01 ENCOUNTER — Encounter: Payer: Self-pay | Admitting: Rehabilitative and Restorative Service Providers"

## 2016-08-01 ENCOUNTER — Ambulatory Visit (INDEPENDENT_AMBULATORY_CARE_PROVIDER_SITE_OTHER): Payer: Medicare HMO | Admitting: Rehabilitative and Restorative Service Providers"

## 2016-08-01 DIAGNOSIS — R293 Abnormal posture: Secondary | ICD-10-CM | POA: Diagnosis not present

## 2016-08-01 DIAGNOSIS — M5416 Radiculopathy, lumbar region: Secondary | ICD-10-CM

## 2016-08-01 DIAGNOSIS — M6281 Muscle weakness (generalized): Secondary | ICD-10-CM

## 2016-08-01 NOTE — Therapy (Signed)
[place], [place], [address] Phone: [phone]   Fax:  [phone]  Physical Therapy Treatment  Patient Details  Name: Robert Guerrero MRN: [id number] Date of Birth: 1950/12/20 Referring Provider: Dr Robert Guerrero  Encounter Date: 08/01/2016      PT End of Session - 08/01/16 0842    Visit Number 14   Number of Visits 24   Date for PT Re-Evaluation 08/28/26   PT Start Time 0841   PT Stop Time 0937   PT Time Calculation (min) 56 min   Activity Tolerance Patient tolerated treatment well      Past Medical History:  Diagnosis Date  . Hyperlipidemia   . Hypertension   . Osteoarthritis of right knee   . Right lumbar radiculitis   . SVT (supraventricular tachycardia) ([place])     History reviewed. No pertinent surgical history.  There were no vitals filed for this visit.      Subjective Assessment - 08/01/16 0843    Subjective [name] that he ended up having to work for three days in the [employer]ur[employer] and reports incresed back tightness into the hips - Rt > Lt. He tried to do the exercises during the work day. Noticed that he had less pain and difficulty tolerating the work tasks than prior to beginning therapy. Ready for the needling.    Currently in Pain? No/denies   Pain Location Hip   Pain Orientation Right;Left   Pain Descriptors / Indicators Tightness                         [facility] Adult PT Treatment/Exercise - 08/01/16 0001      Neuro Re-ed    Neuro Re-ed Details  working on three part core throughout exercises and activities      Lumbar Exercises: Stretches   Standing Extension 3 reps  2-3 sec hold      Lumbar Exercises: Standing   Other Standing Lumbar Exercises hip extension; hip abduction leading with heel 2 sets x 10 - facing counter; hip abduction w/ red TB above knees 10 reps x 2 sets facing counter; back at wall engaging core and extending hips lifting buttocks  from wall x 10    Other Standing Lumbar Exercises step ups forward and side x 40-50 reps each LE; shallow knee bend with row x 20; added greenb TB x 30     Lumbar Exercises: Seated   Sit to Stand 10 reps  core engaged   Sit to Stand Limitations sitting core engaged - pressing upper back into dynadisc 10 sec hold x 10 2 sets - focus on core and back extension      Moist Heat Therapy   Number Minutes Moist Heat 20 Minutes   Moist Heat Location Lumbar Spine;Hip  bilat hips      Electrical Stimulation   Electrical Stimulation Location Rt hip, lumbar paraspinals.    Electrical Stimulation Action IFC and estim with DN   Electrical Stimulation Parameters to tolerance   Electrical Stimulation Goals Tone;Pain     Manual Therapy   Manual therapy comments pt prone   Soft tissue mobilization working through lumbar paraspinals; QL; hip abductors including piriformis and glut med/max    Myofascial Release lumbar/hip Rt           Trigger Point Dry Needling - 08/01/16 0930    Consent Given? Yes   Muscles Treated Lower Body --  bilat - estim with  DN    Longissimus Response Palpable increased muscle length   Gluteus Maximus Response Palpable increased muscle length   Gluteus Minimus Response Palpable increased muscle length   Piriformis Response Palpable increased muscle length                   PT Long Term Goals - 08/01/16 0841      PT LONG TERM GOAL #1   Title Improve posture and alignment with standing and walking with patient to demonstrate increased hip extension 08/27/16   Time 12   Period Weeks   Status On-going     PT LONG TERM GOAL #2   Title Increase mobility and ROM through bilat hips with patient to demonstrate/report increased movement in LE's for functional activities such as dressing and putting on shoes 08/27/16   Time 12   Period Weeks   Status On-going     PT LONG TERM GOAL #3   Title Increase strength bilat hips to 5-/5 to 5/5 with manual muscle  testing 08/27/16   Time 12   Period Weeks   Status On-going     PT LONG TERM GOAL #4   Title Patient tolerates sitting and standing for 30-45 min with minimal to no increase in pain 07/2916   Time 12   Period Weeks   Status On-going     PT LONG TERM GOAL #5   Title Independent in HEP 08/27/16   Time 12   Period Weeks   Status On-going     PT LONG TERM GOAL #6   Title Improve FOTO to </= 39% limitation 08/27/16   Time 12   Period Weeks   Status On-going               Plan - 08/01/16 0848    Clinical Impression Statement Flare up of symptoms 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 Standing increases LB and hip pain. Added exercises with minimal difficulty. Cotninued good response to DN with estim.    Rehab Potential Good   PT Frequency 2x / week   PT Duration 12 weeks   PT Treatment/Interventions Patient/family education;ADLs/Self Care Home Management;Neuromuscular re-education;Cryotherapy;Electrical Stimulation;Iontophoresis 4mg /ml Dexamethasone;Moist Heat;Traction;Ultrasound;Dry needling;Manual techniques;Therapeutic activities;Therapeutic exercise   PT Next Visit Plan continue rehab with focus on stretching hip flexors; core stabilization; strengthening; DN; manual work; modalities as indicated.    Consulted and Agree with Plan of Care Patient      Patient will benefit from skilled therapeutic intervention in order to improve the following deficits and impairments:  Postural dysfunction, Improper body mechanics, Pain, Decreased range of motion, Decreased mobility, Decreased strength, Increased muscle spasms, Increased fascial restricitons, Decreased activity tolerance  Visit Diagnosis: Radiculopathy, lumbar region  Abnormal posture  Muscle weakness (generalized)     Problem List Patient Active Problem List   Diagnosis Date Noted  . Slurred speech 07/09/2016  . Obstructive uropathy 06/13/2016  . Fatigue 05/16/2016  . GERD  (gastroesophageal reflux disease) 05/16/2016  . Obesity 05/16/2016  . Annual physical exam 05/16/2016  . Major depression, chronic 05/16/2016  . Dacryostenosis of right nasolacrimal duct 09/01/2015  . Neck mass 07/28/2015  . Bilateral carpal tunnel syndrome 06/05/2015  . SVT (supraventricular tachycardia) ([place]) 07/07/2013  . Essential hypertension 05/19/2013  . Hyperlipidemia 05/19/2013  . Atypical chest pain 03/29/2013  . Right lumbar radiculitis 01/26/2013  . Osteoarthritis of right knee 12/29/2012    Tyshawna Alarid Nilda Simmer PT, MPH  08/01/2016, 9:32 AM  [facility] [place], [place], [address] Phone: [phone]   Fax:  [phone]  Name: Robert Guerrero MRN: [id number] Date of Birth: 15-Oct-1950

## 2016-08-05 ENCOUNTER — Encounter: Payer: Self-pay | Admitting: Rehabilitative and Restorative Service Providers"

## 2016-08-05 ENCOUNTER — Ambulatory Visit (INDEPENDENT_AMBULATORY_CARE_PROVIDER_SITE_OTHER): Payer: Medicare HMO | Admitting: Rehabilitative and Restorative Service Providers"

## 2016-08-05 DIAGNOSIS — M5416 Radiculopathy, lumbar region: Secondary | ICD-10-CM

## 2016-08-05 DIAGNOSIS — R293 Abnormal posture: Secondary | ICD-10-CM | POA: Diagnosis not present

## 2016-08-05 DIAGNOSIS — M6281 Muscle weakness (generalized): Secondary | ICD-10-CM | POA: Diagnosis not present

## 2016-08-05 NOTE — Therapy (Signed)
[place], [place], [address] Phone: [phone]   Fax:  [phone]  Physical Therapy Treatment  Patient Details  Name: Robert Guerrero MRN: [id number] Date of Birth: [birth year] Referring Provider: Dr Dianah Field  Encounter Date: 08/05/2016      PT End of Session - 08/05/16 0856    Visit Number 15   Number of Visits 24   Date for PT Re-Evaluation 08/28/26   PT Start Time 0845   PT Stop Time 0939   PT Time Calculation (min) 54 min   Activity Tolerance Patient tolerated treatment well      Past Medical History:  Diagnosis Date  . Hyperlipidemia   . Hypertension   . Osteoarthritis of right knee   . Right lumbar radiculitis   . SVT (supraventricular tachycardia) ([place])     History reviewed. No pertinent surgical history.  There were no vitals filed for this visit.      Subjective Assessment - 08/05/16 0856    Subjective Had to work Friday which made 4 days in the past week. He notices that he has increased pain with working increased hours. 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 He feels the core is getting stringer.    Currently in Pain? No/denies            [facility] PT Assessment - 08/05/16 0001      Assessment   Medical Diagnosis Rt lumbar radiculopathy    Referring Provider Dr Dianah Field   Onset Date/Surgical Date 08/31/15   Hand Dominance Right   Next MD Visit PRN     Posture/Postural Control   Posture Comments forward flexed at trunk and hips - gradual improvement noted      AROM   Lumbar Extension 15%  stiff/discomfort      Strength   Right Hip Extension --  5-/5   Left Hip Extension --  5-/5     Palpation   Palpation comment tight/tender Rt posterior hip/piriformis/gluteals                      [facility] Adult PT Treatment/Exercise - 08/05/16 0001      Lumbar Exercises:  Standing   Wall Slides 10 reps  2 sets 20 sec hold w/exercise ball at back    Row Strengthening;Both;20 reps;Theraband  core engaged    Theraband Level (Row) Level 4 (Blue)   Other Standing Lumbar Exercises step ups forward and side x 40 reps 2 sets each LE; shallow knee bend with row x 20; added blue TB x 30     Lumbar Exercises: Seated   Sit to Stand Limitations sitting core engaged - pressing upper back into dynadisc 10 sec hold x 10 2 sets - focus on core and back extension      Moist Heat Therapy   Number Minutes Moist Heat 20 Minutes   Moist Heat Location Lumbar Spine;Hip  bilat hips      Cryotherapy   Number Minutes Cryotherapy 15 Minutes   Cryotherapy Location Knee   Type of Cryotherapy Ice pack     Electrical Stimulation   Electrical Stimulation Location Rt hip, lumbar paraspinals.    Electrical Stimulation Action IFC and estim w/ DN    Electrical Stimulation Parameters to tolerance   Electrical Stimulation Goals Tone;Pain     Manual Therapy   Manual therapy comments pt prone   Soft tissue  mobilization working through lumbar paraspinals; QL; hip abductors including piriformis and glut med/max    Myofascial Release lumbar/hip Rt           Trigger Point Dry Needling - 08/05/16 4259    Consent Given? Yes   Muscles Treated Lower Body --  bilat    Longissimus Response Palpable increased muscle length   Gluteus Maximus Response Palpable increased muscle length   Gluteus Minimus Response Palpable increased muscle length   Piriformis Response Palpable increased muscle length                   PT Long Term Goals - 08/05/16 0955      PT LONG TERM GOAL #1   Title Improve posture and alignment with standing and walking with patient to demonstrate increased hip extension 08/27/16   Time 12   Period Weeks   Status On-going     PT LONG TERM GOAL #2   Title Increase mobility and ROM through bilat hips with patient to demonstrate/report increased movement in  LE's for functional activities such as dressing and putting on shoes 08/27/16   Time 12   Period Weeks   Status On-going     PT LONG TERM GOAL #3   Title Increase strength bilat hips to 5-/5 to 5/5 with manual muscle testing 08/27/16   Time 12   Period Weeks   Status Achieved     PT LONG TERM GOAL #4   Title Patient tolerates sitting and standing for 30-45 min with minimal to no increase in pain 07/2916   Time 12   Period Weeks   Status Partially Met     PT LONG TERM GOAL #5   Title Independent in HEP 08/27/16   Time 12   Period Weeks   Status On-going     PT LONG TERM GOAL #6   Title Improve FOTO to </= 39% limitation 08/27/16   Time 12   Period Weeks   Status On-going               Plan - 08/05/16 0959    Clinical Impression Statement Flare up end of the week last week due to increased work hours through the week. Felt better Saturday and noticed increased standing working tolerance in the yard/garden. Note less palpable tightness bilat lumbar spine and hip musculature. Some Rt knee pain following wall slide. (History of some knee arthritis - has received injections in knees in past.) Continues to progress with core strengthening. good response to DN. Accomplished strength goal.    Rehab Potential Good   PT Frequency 2x / week   PT Duration 12 weeks   PT Treatment/Interventions Patient/family education;ADLs/Self Care Home Management;Neuromuscular re-education;Cryotherapy;Electrical Stimulation;Iontophoresis 28m/ml Dexamethasone;Moist Heat;Traction;Ultrasound;Dry needling;Manual techniques;Therapeutic activities;Therapeutic exercise   PT Next Visit Plan continue rehab with focus on stretching hip flexors; core stabilization; strengthening; DN; manual work; modalities as indicated. Focus on core stabilization    Consulted and Agree with Plan of Care Patient      Patient will benefit from skilled therapeutic intervention in order to improve the following deficits and  impairments:  Postural dysfunction, Improper body mechanics, Pain, Decreased range of motion, Decreased mobility, Decreased strength, Increased muscle spasms, Increased fascial restricitons, Decreased activity tolerance  Visit Diagnosis: Radiculopathy, lumbar region  Abnormal posture  Muscle weakness (generalized)     Problem List Patient Active Problem List   Diagnosis Date Noted  . Slurred speech 07/09/2016  . Obstructive uropathy 06/13/2016  . Fatigue 05/16/2016  .  GERD (gastroesophageal reflux disease) 05/16/2016  . Obesity 05/16/2016  . Annual physical exam 05/16/2016  . Major depression, chronic 05/16/2016  . Dacryostenosis of right nasolacrimal duct 09/01/2015  . Neck mass 07/28/2015  . Bilateral carpal tunnel syndrome 06/05/2015  . SVT (supraventricular tachycardia) ([place]) 07/07/2013  . Essential hypertension 05/19/2013  . Hyperlipidemia 05/19/2013  . Atypical chest pain 03/29/2013  . Right lumbar radiculitis 01/26/2013  . Osteoarthritis of right knee 12/29/2012    Tnya Ades Nilda Simmer PT, MPH  08/05/2016, 10:02 AM  [facility] [place], [place], [address] Phone: [phone]   Fax:  [phone]  Name: Robert Guerrero MRN: [id number] Date of Birth: July 19, 1950

## 2016-08-08 ENCOUNTER — Encounter: Payer: Self-pay | Admitting: Rehabilitative and Restorative Service Providers"

## 2016-08-08 ENCOUNTER — Ambulatory Visit (INDEPENDENT_AMBULATORY_CARE_PROVIDER_SITE_OTHER): Payer: Medicare HMO | Admitting: Rehabilitative and Restorative Service Providers"

## 2016-08-08 DIAGNOSIS — M5416 Radiculopathy, lumbar region: Secondary | ICD-10-CM

## 2016-08-08 DIAGNOSIS — M6281 Muscle weakness (generalized): Secondary | ICD-10-CM

## 2016-08-08 DIAGNOSIS — R293 Abnormal posture: Secondary | ICD-10-CM

## 2016-08-08 NOTE — Therapy (Signed)
[place], [place], [address] Phone: [phone]   Fax:  [phone]  Physical Therapy Treatment  Patient Details  Name: Robert Guerrero MRN: [id number] Date of Birth: 09-17-1950 Referring Provider: Dr Dianah Field  Encounter Date: 08/08/2016      PT End of Session - 08/08/16 0847    Visit Number 16   Number of Visits 24   Date for PT Re-Evaluation 08/28/26   PT Start Time 0843   PT Stop Time 0940   PT Time Calculation (min) 57 min   Activity Tolerance Patient tolerated treatment well      Past Medical History:  Diagnosis Date  . Hyperlipidemia   . Hypertension   . Osteoarthritis of right knee   . Right lumbar radiculitis   . SVT (supraventricular tachycardia) ([place])     History reviewed. No pertinent surgical history.  There were no vitals filed for this visit.      Subjective Assessment - 08/08/16 0853    Subjective [name] that he worked 3 hours in the garden Monday and Tuesday. He made it but is "worn out".    Currently in Pain? No/denies                         [facility] Adult PT Treatment/Exercise - 08/08/16 0001      Lumbar Exercises: Stretches   Hip Flexor Stretch 2 reps;60 seconds  sitting at edge of table      Lumbar Exercises: Aerobic   Stationary Bike NuStep L5: 6 min (arms/legs)      Lumbar Exercises: Standing   Wall Slides 10 reps  2 sets 20 sec hold w/exercise ball at back    Other Standing Lumbar Exercises press down UE to engage core    Other Standing Lumbar Exercises step ups forward and side x 40 reps 2 sets each LE; shallow knee bend with row x 20; added blue TB x 30     Lumbar Exercises: Seated   Sit to Stand Limitations sitting core engaged - pressing upper back into dynadisc 10 sec hold x 10 2 sets - focus on core and back extension      Moist Heat Therapy   Number Minutes Moist Heat 20 Minutes   Moist Heat Location Lumbar Spine;Hip   bilat hips      Electrical Stimulation   Electrical Stimulation Location Rt hip, lumbar paraspinals.    Electrical Stimulation Action IFC and estim with DN   Electrical Stimulation Parameters to tolerance   Electrical Stimulation Goals Tone;Pain     Manual Therapy   Manual therapy comments pt prone   Soft tissue mobilization working through lumbar paraspinals; QL; hip abductors including piriformis and glut med/max    Myofascial Release lumbar/hip Rt           Trigger Point Dry Needling - 08/08/16 8099    Consent Given? Yes   Muscles Treated Lower Body --  bilat   Longissimus Response Palpable increased muscle length   Gluteus Maximus Response Palpable increased muscle length   Gluteus Minimus Response Palpable increased muscle length   Piriformis Response Palpable increased muscle length                   PT Long Term Goals - 08/05/16 0955      PT LONG TERM GOAL #1   Title Improve posture and alignment with standing and walking with patient to demonstrate increased hip extension  08/27/16   Time 12   Period Weeks   Status On-going     PT LONG TERM GOAL #2   Title Increase mobility and ROM through bilat hips with patient to demonstrate/report increased movement in LE's for functional activities such as dressing and putting on shoes 08/27/16   Time 12   Period Weeks   Status On-going     PT LONG TERM GOAL #3   Title Increase strength bilat hips to 5-/5 to 5/5 with manual muscle testing 08/27/16   Time 12   Period Weeks   Status Achieved     PT LONG TERM GOAL #4   Title Patient tolerates sitting and standing for 30-45 min with minimal to no increase in pain 07/2916   Time 12   Period Weeks   Status Partially Met     PT LONG TERM GOAL #5   Title Independent in HEP 08/27/16   Time 12   Period Weeks   Status On-going     PT LONG TERM GOAL #6   Title Improve FOTO to </= 39% limitation 08/27/16   Time 12   Period Weeks   Status On-going                Plan - 08/08/16 0854    Clinical Impression Statement Continues to demonstrates and report increase in activity, Cotinued tightness noted in hip flexors. Responds well to DN. Continues to progress well toward goals of therapy.     Rehab Potential Good   PT Frequency 2x / week   PT Duration 12 weeks   PT Treatment/Interventions Patient/family education;ADLs/Self Care Home Management;Neuromuscular re-education;Cryotherapy;Electrical Stimulation;Iontophoresis 58m/ml Dexamethasone;Moist Heat;Traction;Ultrasound;Dry needling;Manual techniques;Therapeutic activities;Therapeutic exercise   PT Next Visit Plan continue rehab with focus on stretching hip flexors; core stabilization; strengthening; DN; manual work; modalities as indicated. Focus on core stabilization    Consulted and Agree with Plan of Care Patient      Patient will benefit from skilled therapeutic intervention in order to improve the following deficits and impairments:  Postural dysfunction, Improper body mechanics, Pain, Decreased range of motion, Decreased mobility, Decreased strength, Increased muscle spasms, Increased fascial restricitons, Decreased activity tolerance  Visit Diagnosis: Radiculopathy, lumbar region  Abnormal posture  Muscle weakness (generalized)     Problem List Patient Active Problem List   Diagnosis Date Noted  . Slurred speech 07/09/2016  . Obstructive uropathy 06/13/2016  . Fatigue 05/16/2016  . GERD (gastroesophageal reflux disease) 05/16/2016  . Obesity 05/16/2016  . Annual physical exam 05/16/2016  . Major depression, chronic 05/16/2016  . Dacryostenosis of right nasolacrimal duct 09/01/2015  . Neck mass 07/28/2015  . Bilateral carpal tunnel syndrome 06/05/2015  . SV[place] ([place] tachycardia) ([place] 07/07/2013  . Essential hypertension 05/19/2013  . Hyperlipidemia 05/19/2013  . Atypical chest pain 03/29/2013  . Right lumbar radiculitis 01/26/2013  . Osteoarthritis  of right knee 12/29/2012    Sheilyn Boehlke PNilda SimmerPT, MPH 08/08/2016, 9:28 AM  [facility] [address]Phone: [phone]  Fax:  [phone] Name: Robert WenzlickMRN: [id number] of Birth: [birth year]

## 2016-08-12 ENCOUNTER — Encounter: Payer: Self-pay | Admitting: Rehabilitative and Restorative Service Providers"

## 2016-08-12 ENCOUNTER — Ambulatory Visit (INDEPENDENT_AMBULATORY_CARE_PROVIDER_SITE_OTHER): Payer: Medicare HMO | Admitting: Rehabilitative and Restorative Service Providers"

## 2016-08-12 DIAGNOSIS — M6281 Muscle weakness (generalized): Secondary | ICD-10-CM

## 2016-08-12 DIAGNOSIS — R293 Abnormal posture: Secondary | ICD-10-CM | POA: Diagnosis not present

## 2016-08-12 DIAGNOSIS — M5416 Radiculopathy, lumbar region: Secondary | ICD-10-CM

## 2016-08-12 NOTE — Patient Instructions (Signed)
Standing back to wall Engage core  Push hips/butt away from wall  Hold 1 min  Repeat 5-10 times keeping core tight and pushing hips forward Want to feel some subtle stretch across the fron of the hips

## 2016-08-12 NOTE — Therapy (Signed)
[place], [place], [address] Phone: [phone]   Fax:  [phone]  Physical Therapy Treatment  Patient Details  Name: Robert Guerrero MRN: [id number] Date of Birth: 12-07-1950 Referring Provider: Dr Dianah Field  Encounter Date: 08/12/2016      PT End of Session - 08/12/16 0845    Visit Number 17   Number of Visits 24   Date for PT Re-Evaluation 08/28/26   PT Start Time 0845   PT Stop Time 0942   PT Time Calculation (min) 57 min   Activity Tolerance Patient tolerated treatment well      Past Medical History:  Diagnosis Date  . Hyperlipidemia   . Hypertension   . Osteoarthritis of right knee   . Right lumbar radiculitis   . SVT (supraventricular tachycardia) ([place])     History reviewed. No pertinent surgical history.  There were no vitals filed for this visit.      Subjective Assessment - 08/12/16 0845    Subjective Robert Guerrero reports that he does better when he does his exercises and watches his posture. Worked in the garden this weekend. Noticed that when he started having pain and stopped to stretch he flet much better.  May want to hold therapy since he is close to the limit for medicare coverage.    Currently in Pain? No/denies            [facility] PT Assessment - 08/12/16 0001      Assessment   Medical Diagnosis Rt lumbar radiculopathy    Referring Provider Dr Dianah Field   Onset Date/Surgical Date 08/31/15   Hand Dominance Right   Next MD Visit PRN     Posture/Postural Control   Posture Comments forward flexed at trunk and hips - gradual improvement noted      AROM   Lumbar Extension 15%  stiff/discomfort      Strength   Right Hip Extension --  5-/5   Left Hip Extension --  5-/5     Palpation   Palpation comment tight/tender Rt posterior hip/piriformis/gluteals                      [facility] Adult PT Treatment/Exercise - 08/12/16 0001      Lumbar Exercises:  Stretches   Hip Flexor Stretch 2 reps;60 seconds  sitting at edge of table      Lumbar Exercises: Aerobic   Stationary Bike NuStep L5: 6 min (arms/legs)      Lumbar Exercises: Standing   Other Standing Lumbar Exercises back at wall lifting hips away from wall to work on hip extension and functional stretch for anterior hips; facing wall hip extension x 10 each side x 2 sets core engaged     Moist Heat Therapy   Number Minutes Moist Heat 20 Minutes   Moist Heat Location Lumbar Spine;Hip  bilat hips      Electrical Stimulation   Electrical Stimulation Location Rt hip, lumbar paraspinals.    Electrical Stimulation Action IFC and estim with DN   Electrical Stimulation Parameters to tolerance   Electrical Stimulation Goals Tone;Pain     Manual Therapy   Manual therapy comments pt prone   Soft tissue mobilization working through lumbar paraspinals; QL; hip abductors including piriformis and glut med/max    Myofascial Release lumbar/hip Rt           Trigger Point Dry Needling - 08/12/16 0936    Consent Given? Yes   Muscles Treated  Lower Body --  bilat with estim post DN   Longissimus Response Palpable increased muscle length   Gluteus Maximus Response Palpable increased muscle length   Gluteus Minimus Response Palpable increased muscle length   Piriformis Response Palpable increased muscle length              PT Education - 08/12/16 0906    Education provided Yes   Education Details HEP    Person(s) Educated Patient   Methods Explanation;Demonstration;Tactile cues;Verbal cues;Handout   Comprehension Verbalized understanding;Returned demonstration;Verbal cues required;Tactile cues required             PT Long Term Goals - 08/12/16 0940      PT LONG TERM GOAL #1   Title Improve posture and alignment with standing and walking with patient to demonstrate increased hip extension 08/27/16   Time 12   Period Weeks   Status On-going     PT LONG TERM GOAL #2    Title Increase mobility and ROM through bilat hips with patient to demonstrate/report increased movement in LE's for functional activities such as dressing and putting on shoes 08/27/16   Time 12   Period Weeks   Status On-going     PT LONG TERM GOAL #3   Title Increase strength bilat hips to 5-/5 to 5/5 with manual muscle testing 08/27/16   Time 12   Period Weeks   Status Achieved     PT LONG TERM GOAL #4   Title Patient tolerates sitting and standing for 30-45 min with minimal to no increase in pain 07/2916   Time 12   Period Weeks   Status Achieved     PT LONG TERM GOAL #5   Title Independent in HEP 08/27/16   Time 12   Period Weeks   Status Partially Met     PT LONG TERM GOAL #6   Title Improve FOTO to </= 39% limitation 08/27/16   Time 12   Period Weeks   Status On-going               Plan - 08/12/16 9242    Clinical Impression Statement Robert Guerrero cointinues to report and demonstrate improvement with therapy and home program. He has responded well to DN with estim. All has less palpable tightness through the lumbar and hip areas, improving functional activity endurance/tolerance. He continues to demonstrate forward posture; tightness through the hip flexors; weakness hip extensors. Robert Guerrero would like to cointnue with HEP and stretch out his appointments to save his visits. This is reasonable and will hopefully motivate Robert Guerrero to be more consistent with HEP>    Rehab Potential Good   PT Frequency 2x / week   PT Duration 12 weeks   PT Treatment/Interventions Patient/family education;ADLs/Self Care Home Management;Neuromuscular re-education;Cryotherapy;Electrical Stimulation;Iontophoresis 65m/ml Dexamethasone;Moist Heat;Traction;Ultrasound;Dry needling;Manual techniques;Therapeutic activities;Therapeutic exercise   PT Next Visit Plan continue rehab with focus on stretching hip flexors; core stabilization; strengthening; DN; manual work; modalities as indicated. Focus on core stabilization -  schedule return appt in 2-3 weeks    Consulted and Agree with Plan of Care Patient      Patient will benefit from skilled therapeutic intervention in order to improve the following deficits and impairments:  Postural dysfunction, Improper body mechanics, Pain, Decreased range of motion, Decreased mobility, Decreased strength, Increased muscle spasms, Increased fascial restricitons, Decreased activity tolerance  Visit Diagnosis: Radiculopathy, lumbar region  Abnormal posture  Muscle weakness (generalized)     Problem List Patient Active Problem List   Diagnosis Date  Noted  . Slurred speech 07/09/2016  . Obstructive uropathy 06/13/2016  . Fatigue 05/16/2016  . GERD (gastroesophageal reflux disease) 05/16/2016  . Obesity 05/16/2016  . Annual physical exam 05/16/2016  . Major depression, chronic 05/16/2016  . Dacryostenosis of right nasolacrimal duct 09/01/2015  . Neck mass 07/28/2015  . Bilateral carpal tunnel syndrome 06/05/2015  . SVT (supraventricular tachycardia) ([place]) 07/07/2013  . Essential hypertension 05/19/2013  . Hyperlipidemia 05/19/2013  . Atypical chest pain 03/29/2013  . Right lumbar radiculitis 01/26/2013  . Osteoarthritis of right knee 12/29/2012    Aadarsh Cozort Nilda Simmer PT, MPH  08/12/2016, 9:41 AM  [facility] [place], [place], [address] Phone: [phone]   Fax:  [phone]  Name: Robert Guerrero MRN: [id number] Date of Birth: 02/12/1951

## 2016-08-15 ENCOUNTER — Encounter: Payer: Medicare HMO | Admitting: Rehabilitative and Restorative Service Providers"

## 2016-08-30 ENCOUNTER — Encounter: Payer: Self-pay | Admitting: Sports Medicine

## 2016-09-03 ENCOUNTER — Encounter: Payer: Self-pay | Admitting: Rehabilitative and Restorative Service Providers"

## 2016-09-03 ENCOUNTER — Ambulatory Visit (INDEPENDENT_AMBULATORY_CARE_PROVIDER_SITE_OTHER): Payer: Medicare HMO | Admitting: Rehabilitative and Restorative Service Providers"

## 2016-09-03 DIAGNOSIS — R293 Abnormal posture: Secondary | ICD-10-CM

## 2016-09-03 DIAGNOSIS — M6281 Muscle weakness (generalized): Secondary | ICD-10-CM

## 2016-09-03 DIAGNOSIS — M5416 Radiculopathy, lumbar region: Secondary | ICD-10-CM | POA: Diagnosis not present

## 2016-09-03 NOTE — Therapy (Addendum)
[place], [place], [address] Phone: [phone]   Fax:  [phone]  Physical Therapy Treatment  Patient Details  Name: Robert Guerrero MRN: [id number] Date of Birth: 08-Oct-1950 Referring Provider: Dr Dianah Field  Encounter Date: 09/03/2016      PT End of Session - 09/03/16 0847    Visit Number 18   Number of Visits 24   Date for PT Re-Evaluation 08/28/26   PT Start Time 0845   PT Stop Time 0943   PT Time Calculation (min) 58 min   Activity Tolerance Patient tolerated treatment well      Past Medical History:  Diagnosis Date  . Hyperlipidemia   . Hypertension   . Osteoarthritis of right knee   . Right lumbar radiculitis   . SVT (supraventricular tachycardia) ([place])     History reviewed. No pertinent surgical history.  There were no vitals filed for this visit.      Subjective Assessment - 09/03/16 0856    Subjective Robert Guerrero reports that he is working on his exercises at home. He feels this will be "a way of life". He will stop therapy for now and continue wit hhis HEP. He does still have some pain and discomfort with certain activities but can tell he is much improved. Confident in continuing with independent HEP    Currently in Pain? No/denies            [facility] PT Assessment - 09/03/16 0001      Assessment   Medical Diagnosis Rt lumbar radiculopathy    Referring Provider Dr Dianah Field   Onset Date/Surgical Date 08/31/15   Hand Dominance Right   Next MD Visit PRN     Observation/Other Assessments   Focus on Therapeutic Outcomes (FOTO)  35% limitation      AROM   Lumbar Extension 25%  a little discomfort      Strength   Right Hip Extension 5/5   Left Hip Extension 5/5     Palpation   Palpation comment tight/tender Rt posterior hip/piriformis/gluteals                      [facility] Adult PT Treatment/Exercise - 09/03/16 0001      Lumbar Exercises: Stretches    Hip Flexor Stretch 2 reps;60 seconds  sitting at edge of table      Lumbar Exercises: Aerobic   Stationary Bike NuStep L5: 6 min (arms/legs)      Lumbar Exercises: Standing   Wall Slides 10 reps   Other Standing Lumbar Exercises back at wall lifting hips away from wall to work on hip extension and functional stretch for anterior hips; facing wall hip extension x 10 each side x 2 sets core engaged     Moist Heat Therapy   Number Minutes Moist Heat 20 Minutes   Moist Heat Location Lumbar Spine;Hip  bilat hips      Electrical Stimulation   Electrical Stimulation Location Rt hip, lumbar paraspinals.    Electrical Stimulation Action IFC and estim with DN    Electrical Stimulation Parameters to tolerance   Electrical Stimulation Goals Tone;Pain     Manual Therapy   Manual therapy comments pt prone   Soft tissue mobilization working through lumbar paraspinals; QL; hip abductors including piriformis and glut med/max    Myofascial Release lumbar/hip Rt           Trigger Point Dry Needling - 09/03/16 0926    Consent Given? Yes  Muscles Treated Lower Body --  bilat with estim with DN    Longissimus Response Palpable increased muscle length   Gluteus Maximus Response Palpable increased muscle length   Gluteus Minimus Response Palpable increased muscle length   Piriformis Response Palpable increased muscle length                   PT Long Term Goals - 09/03/16 0848      PT LONG TERM GOAL #1   Title Improve posture and alignment with standing and walking with patient to demonstrate increased hip extension 08/27/16   Time 12   Period Weeks   Status Partially Met     PT LONG TERM GOAL #2   Title Increase mobility and ROM through bilat hips with patient to demonstrate/report increased movement in LE's for functional activities such as dressing and putting on shoes 08/27/16   Time 12   Period Weeks   Status Partially Met     PT LONG TERM GOAL #3   Title Increase  strength bilat hips to 5-/5 to 5/5 with manual muscle testing 08/27/16   Time 12   Period Weeks   Status Achieved     PT LONG TERM GOAL #4   Title Patient tolerates sitting and standing for 30-45 min with minimal to no increase in pain 07/2916   Time 12   Period Weeks   Status Achieved     PT LONG TERM GOAL #5   Title Independent in HEP 08/27/16   Time 12   Period Weeks   Status Partially Met     PT LONG TERM GOAL #6   Title Improve FOTO to </= 39% limitation 08/27/16   Time 12   Period Weeks   Status Achieved               Plan - 09/03/16 6468    Clinical Impression Statement Robert Guerrero continues to report decreased pain and increased activity tolerance. He demonstrates increased trunk mobility and 5/5 hip extension strength. FOTO goal is accomplished. Remainder of goals are partially accomplished. Patient will be discharged to independent HEP. He will call with any questions or problems.    Rehab Potential Good   PT Frequency 2x / week   PT Duration 12 weeks   PT Treatment/Interventions Patient/family education;ADLs/Self Care Home Management;Neuromuscular re-education;Cryotherapy;Electrical Stimulation;Iontophoresis 69m/ml Dexamethasone;Moist Heat;Traction;Ultrasound;Dry needling;Manual techniques;Therapeutic activities;Therapeutic exercise   PT Next Visit Plan d/c to independent HEP    Consulted and Agree with Plan of Care Patient      Patient will benefit from skilled therapeutic intervention in order to improve the following deficits and impairments:  Postural dysfunction, Improper body mechanics, Pain, Decreased range of motion, Decreased mobility, Decreased strength, Increased muscle spasms, Increased fascial restricitons, Decreased activity tolerance  Visit Diagnosis: Radiculopathy, lumbar region  Abnormal posture  Muscle weakness (generalized)     Problem List Patient Active Problem List   Diagnosis Date Noted  . Slurred speech 07/09/2016  . Obstructive  uropathy 06/13/2016  . Fatigue 05/16/2016  . GERD (gastroesophageal reflux disease) 05/16/2016  . Obesity 05/16/2016  . Annual physical exam 05/16/2016  . Major depression, chronic 05/16/2016  . Dacryostenosis of right nasolacrimal duct 09/01/2015  . Neck mass 07/28/2015  . Bilateral carpal tunnel syndrome 06/05/2015  . SVT (sup[place]ricular tachycardia) ([place] 07/07/2013  . Essential hypertension 05/19/2013  . Hyperlipidemia 05/19/2013  . Atypical chest pain 03/29/2013  . Right lumbar radiculitis 01/26/2013  . Osteoarthritis of right knee 12/29/2012    Jesalyn Finazzo P  Kathleene Hazel, MPH  09/03/2016, 9:31 AM  [facility] [place], [place], [address] Phone: [phone]   Fax:  [phone]  Name: Robert Guerrero MRN: [id number] Date of Birth: 03/07/1951  PHYSICAL THERAPY DISCHARGE SUMMARY  Visits from Start of Care: 18  Current functional level related to goals / functional outcomes: See MD note for discharge status. Excellent progress.    Remaining deficits: Intermittent symptoms - should continue consistent HEP    Education / Equipment: HEP Plan: Patient agrees to discharge.  Patient goals were partially met. Patient is being discharged due to being pleased with the current functional level.  ?????    Annetta Deiss P. Helene Kelp PT, MPH 09/03/16 9:32 AM   PHYSICAL THERAPY DISCHARGE SUMMARY  Visits from Start of Care: 18  Current functional level related to goals / functional outcomes: See last progress note for discharge status   Remaining deficits: Patient needs to continue with current exercise program and monitor activities of daily living for proper posture and alignment    Education / Equipment: HEP Plan: Patient agrees to discharge.  Patient goals were partially met. Patient is being discharged due to being pleased with the current functional level.  ?????   Caressa Scearce P. Helene Kelp PT, MPH 10/15/16 9:59 AM

## 2016-09-17 ENCOUNTER — Other Ambulatory Visit: Payer: Self-pay

## 2016-09-17 DIAGNOSIS — I10 Essential (primary) hypertension: Secondary | ICD-10-CM

## 2016-09-17 MED ORDER — VALSARTAN 320 MG PO TABS: 320.0000 mg | ORAL_TABLET | Freq: Every day | ORAL | 1 refills | Status: DC

## 2016-09-17 MED ORDER — AMLODIPINE BESYLATE 5 MG PO TABS: 5.0000 mg | ORAL_TABLET | Freq: Every day | ORAL | 1 refills | Status: DC

## 2016-09-19 ENCOUNTER — Other Ambulatory Visit: Payer: Self-pay | Admitting: Sports Medicine

## 2016-09-19 DIAGNOSIS — I10 Essential (primary) hypertension: Secondary | ICD-10-CM

## 2016-09-19 MED ORDER — AMLODIPINE BESYLATE 5 MG PO TABS: 5.0000 mg | ORAL_TABLET | Freq: Every day | ORAL | 1 refills | Status: DC

## 2016-09-19 MED ORDER — VALSARTAN 320 MG PO TABS: 320.0000 mg | ORAL_TABLET | Freq: Every day | ORAL | 1 refills | Status: DC

## 2016-10-28 ENCOUNTER — Ambulatory Visit: Payer: Medicare HMO | Admitting: Sports Medicine

## 2016-10-31 ENCOUNTER — Encounter: Payer: Self-pay | Admitting: Sports Medicine

## 2016-10-31 ENCOUNTER — Ambulatory Visit (INDEPENDENT_AMBULATORY_CARE_PROVIDER_SITE_OTHER): Payer: Medicare HMO | Admitting: Sports Medicine

## 2016-10-31 DIAGNOSIS — F341 Dysthymic disorder: Secondary | ICD-10-CM | POA: Diagnosis not present

## 2016-10-31 DIAGNOSIS — I10 Essential (primary) hypertension: Secondary | ICD-10-CM | POA: Diagnosis not present

## 2016-10-31 DIAGNOSIS — F329 Major depressive disorder, single episode, unspecified: Secondary | ICD-10-CM

## 2016-10-31 NOTE — Assessment & Plan Note (Signed)
Well-controlled, no changes, self discontinued valsartan but blood pressure has remained controlled on amlodipine alone.

## 2016-10-31 NOTE — Progress Notes (Signed)
  Subjective:    CC: Follow-up  HPI: Hypertension: Well-controlled, self discontinued his valsartan, only taking amlodipine now.  Depression: Resolved, discontinued his Wellbutrin.  Past medical history:  Negative.  See flowsheet/record as well for more information.  Surgical history: Negative.  See flowsheet/record as well for more information.  Family history: Negative.  See flowsheet/record as well for more information.  Social history: Negative.  See flowsheet/record as well for more information.  Allergies, and medications have been entered into the medical record, reviewed, and no changes needed.   Review of Systems: No fevers, chills, night sweats, weight loss, chest pain, or shortness of breath.   Objective:    General: Well Developed, well nourished, and in no acute distress.  Neuro: Alert and oriented x3, extra-ocular muscles intact, sensation grossly intact.  HEENT: Normocephalic, atraumatic, pupils equal round reactive to light, neck supple, no masses, no lymphadenopathy, thyroid nonpalpable.  Skin: Warm and dry, no rashes. Cardiac: Regular rate and rhythm, no murmurs rubs or gallops, no lower extremity edema.  Respiratory: Clear to auscultation bilaterally. Not using accessory muscles, speaking in full sentences.   Impression and Recommendations:    Essential hypertension Well-controlled, no changes, self discontinued valsartan but blood pressure has remained controlled on amlodipine alone.  Major depression, chronic Patient has self discontinued his Wellbutrin, continues to feel well. We did discuss at the last visit that this was likely some degree of seasonal affective disorder which has improved as the length of days has increased.  I spent 25 minutes with this patient, greater than 50% was face-to-face time counseling regarding the above diagnoses

## 2016-10-31 NOTE — Assessment & Plan Note (Signed)
Patient has self discontinued his Wellbutrin, continues to feel well. We did discuss at the last visit that this was likely some degree of seasonal affective disorder which has improved as the length of days has increased.

## 2017-05-05 ENCOUNTER — Ambulatory Visit (INDEPENDENT_AMBULATORY_CARE_PROVIDER_SITE_OTHER): Payer: Medicare HMO | Admitting: Sports Medicine

## 2017-05-05 ENCOUNTER — Encounter: Payer: Self-pay | Admitting: Sports Medicine

## 2017-05-05 VITALS — BP 155/78 | HR 60 | Resp 18 | Ht 71.0 in | Wt 256.0 lb

## 2017-05-05 DIAGNOSIS — Z23 Encounter for immunization: Secondary | ICD-10-CM | POA: Diagnosis not present

## 2017-05-05 DIAGNOSIS — Z0000 Encounter for general adult medical examination without abnormal findings: Secondary | ICD-10-CM

## 2017-05-05 DIAGNOSIS — R7989 Other specified abnormal findings of blood chemistry: Secondary | ICD-10-CM

## 2017-05-05 DIAGNOSIS — I10 Essential (primary) hypertension: Secondary | ICD-10-CM

## 2017-05-05 DIAGNOSIS — E669 Obesity, unspecified: Secondary | ICD-10-CM | POA: Diagnosis not present

## 2017-05-05 DIAGNOSIS — E538 Deficiency of other specified B group vitamins: Secondary | ICD-10-CM | POA: Diagnosis not present

## 2017-05-05 DIAGNOSIS — E782 Mixed hyperlipidemia: Secondary | ICD-10-CM | POA: Diagnosis not present

## 2017-05-05 MED ORDER — AMLODIPINE BESYLATE 5 MG PO TABS: 5.0000 mg | ORAL_TABLET | Freq: Every day | ORAL | 3 refills | Status: DC

## 2017-05-05 MED ORDER — ACARBOSE 50 MG PO TABS: 50.0000 mg | ORAL_TABLET | Freq: Three times a day (TID) | ORAL | 3 refills | Status: DC

## 2017-05-05 NOTE — Assessment & Plan Note (Addendum)
Medicare physical as above. We will address shingles vaccination at his follow-up visit.

## 2017-05-05 NOTE — Progress Notes (Addendum)
Subjective:   Robert Guerrero is a 67 y.o. male who presents for Medicare Annual/Subsequent preventive examination.  Review of Systems:  A comprehensive review of systems is negative       Objective:    Vitals: BP (!) 155/78   Pulse 60   Resp 18   Ht 5\' 11"  (1.803 m)   Wt 256 lb (116.1 kg)   BMI 35.70 kg/m   Body mass index is 35.7 kg/m.  Advanced Directives 05/30/2016  Does Patient Have a Medical Advance Directive? No  Would patient like information on creating a medical advance directive? No - Patient declined    Tobacco Social History   Tobacco Use  Smoking Status Never Smoker  Smokeless Tobacco Never Used     Counseling given: Not Answered   Clinical Intake:  Hypertension: Has been off of medications for several months.  No headaches, visual changes, chest pain.  Obesity: Would like help with weight loss.  He is seeing a complementary and alternative medicine provider.    Past Medical History:  Diagnosis Date  . Hyperlipidemia   . Hypertension   . Osteoarthritis of right knee   . Right lumbar radiculitis   . SVT (supraventricular tachycardia) (HCC)    No past surgical history on file. Family History  Problem Relation Age of Onset  . CAD Father        MI at age 36-60   Social History   Socioeconomic History  . Marital status: Married    Spouse name: None  . Number of children: 2  . Years of education: None  . Highest education level: None  Social Needs  . Financial resource strain: None  . Food insecurity - worry: None  . Food insecurity - inability: None  . Transportation needs - medical: None  . Transportation needs - non-medical: None  Occupational History    Employer: [employer]    Comment: Regulatory affairs officer  Tobacco Use  . Smoking status: Never Smoker  . Smokeless tobacco: Never Used  Substance and Sexual Activity  . Alcohol use: Yes    Comment: Occasional  . Drug use: None  . Sexual activity: None  Other Topics  Concern  . None  Social History Narrative  . None    Outpatient Encounter Medications as of 05/05/2017  Medication Sig  . Vitamin D, Ergocalciferol, (DRISDOL) 50000 units CAPS capsule Take 50,000 Units by mouth every 7 (seven) days.  Marland Kitchen acarbose (PRECOSE) 50 MG tablet Take 1 tablet (50 mg total) by mouth 3 (three) times daily with meals.  Marland Kitchen amLODipine (NORVASC) 5 MG tablet Take 1 tablet (5 mg total) by mouth daily.  Marland Kitchen atorvastatin (LIPITOR) 40 MG tablet Take 1 tablet (40 mg total) by mouth daily.  . cyanocobalamin (,VITAMIN B-12,) 1000 MCG/ML injection Injection 1000 mcg intramuscular weekly for 1 month and then monthly thereafter  . [DISCONTINUED] amLODipine (NORVASC) 5 MG tablet Take 1 tablet (5 mg total) by mouth daily.  . [DISCONTINUED] nitroGLYCERIN (NITROSTAT) 0.4 MG SL tablet Place 1 tablet (0.4 mg total) under the tongue every 5 (five) minutes as needed for chest pain (or tightness).  . [DISCONTINUED] pantoprazole (PROTONIX) 40 MG tablet Take 1 tablet (40 mg total) by mouth daily.  . [DISCONTINUED] tamsulosin (FLOMAX) 0.4 MG CAPS capsule Take 1 capsule (0.4 mg total) by mouth daily.   No facility-administered encounter medications on file as of 05/05/2017.     Activities of Daily Living In your present state of health, do  you have any difficulty performing the following activities: 05/05/2017  Hearing? Y  Vision? N  Difficulty concentrating or making decisions? N  Walking or climbing stairs? N  Dressing or bathing? N  Doing errands, shopping? N  Some recent data might be hidden    Patient Care Team: Silverio Decamp, MD as PCP - General (Sports Medicine) Silverio Decamp, MD as Consulting Physician (Sports Medicine)   Assessment:   This is a routine wellness examination for [place].  Exercise Activities and Dietary recommendations Current Exercise Habits: The patient has a physically strenous job, but has no regular exercise apart from work.  Goals    None       Fall Risk Fall Risk  05/05/2017  Falls in the past year? No   Is the patient's home free of loose throw rugs in walkways, pet beds, electrical cords, etc?   yes      Grab bars in the bathroom? yes      Handrails on the stairs?   yes      Adequate lighting?   yes  Timed Get Up and Go Performed: Normal  Depression Screen PHQ 2/9 Scores 05/05/2017 07/02/2016  PHQ - 2 Score 0 4  PHQ- 9 Score 2 14    Cognitive Function Cognitive function is normal  Immunization History  Administered Date(s) Administered  . Influenza, High Dose Seasonal PF 05/05/2017  . Influenza,inj,Quad PF,6+ Mos 05/16/2016  . Pneumococcal Conjugate-13 05/16/2016  . Tdap 05/16/2016    Qualifies for Shingles Vaccine? Due, will do it at the next visit.  Screening Tests Health Maintenance  Topic Date Due  . PNA vac Low Risk Adult (2 of 2 - PPSV23) 05/16/2017  . COLON CANCER SCREENING ANNUAL FOBT  07/29/2017  . TETANUS/TDAP  05/16/2026  . INFLUENZA VACCINE  Completed  . Hepatitis C Screening  Completed   Cancer Screenings: Lung: Low Dose CT Chest recommended if Age 60-80 years, 30 pack-year currently smoking OR have quit w/in 15years. Patient does not qualify. Colorectal: UTD  Additional Screenings:  Hepatitis B/HIV/Syphillis: n/a Hepatitis C Screening: Done.  General: Well Developed, well nourished, and in no acute distress.  Neuro: Alert and oriented x3, extra-ocular muscles intact, sensation grossly intact. Cranial nerves II through XII are intact, motor, sensory, and coordinative functions are all intact. HEENT: Normocephalic, atraumatic, pupils equal round reactive to light, neck supple, no masses, no lymphadenopathy, thyroid nonpalpable. Oropharynx, nasopharynx, external ear canals are unremarkable. Skin: Warm and dry, no rashes noted.  Cardiac: Regular rate and rhythm, no murmurs rubs or gallops.  Respiratory: Clear to auscultation bilaterally. Not using accessory muscles, speaking in full  sentences.  Abdominal: Soft, nontender, nondistended, positive bowel sounds, no masses, no organomegaly.  Musculoskeletal: Shoulder, elbow, wrist, hip, knee, ankle stable, and with full range of motion.    Plan:   Healthy male, see below in assessment and plan for further details  I have personally reviewed and noted the following in the patient's chart:   . Medical and social history . Use of alcohol, tobacco or illicit drugs  . Current medications and supplements . Functional ability and status . Nutritional status . Physical activity . Advanced directives . List of other physicians . Hospitalizations, surgeries, and ER visits in previous 12 months . Vitals . Screenings to include cognitive, depression, and falls . Referrals and appointments  In addition, I have reviewed and discussed with patient certain preventive protocols, quality metrics, and best practice recommendations. A written personalized care plan for preventive  services as well as general preventive health recommendations were provided to patient.    Aundria Mems, MD  05/06/2017

## 2017-05-05 NOTE — Assessment & Plan Note (Signed)
Self discontinued his blood pressure medicines. We do need to restart.

## 2017-05-05 NOTE — Assessment & Plan Note (Addendum)
Patient is going to work with a complementary and alternative provider for this. I am going to add some acarbose in the meantime, return in 1 month for a weight check.

## 2017-05-06 DIAGNOSIS — R7989 Other specified abnormal findings of blood chemistry: Secondary | ICD-10-CM | POA: Insufficient documentation

## 2017-05-06 DIAGNOSIS — E538 Deficiency of other specified B group vitamins: Secondary | ICD-10-CM | POA: Insufficient documentation

## 2017-05-06 DIAGNOSIS — E782 Mixed hyperlipidemia: Secondary | ICD-10-CM | POA: Insufficient documentation

## 2017-05-06 LAB — COMPREHENSIVE METABOLIC PANEL WITH GFR
Albumin: 4.1 g/dL (ref 3.6–5.1)
BUN: 18 mg/dL (ref 7–25)
Creat: 1.03 mg/dL (ref 0.70–1.25)
Potassium: 4.1 mmol/L (ref 3.5–5.3)
Sodium: 139 mmol/L (ref 135–146)

## 2017-05-06 LAB — COMPREHENSIVE METABOLIC PANEL
AG Ratio: 1.6 (calc) (ref 1.0–2.5)
ALT: 20 U/L (ref 9–46)
AST: 20 U/L (ref 10–35)
Alkaline phosphatase (APISO): 77 U/L (ref 40–115)
CO2: 25 mmol/L (ref 20–32)
Calcium: 9.3 mg/dL (ref 8.6–10.3)
Chloride: 105 mmol/L (ref 98–110)
Globulin: 2.6 g/dL (calc) (ref 1.9–3.7)
Glucose, Bld: 86 mg/dL (ref 65–99)
Total Bilirubin: 0.5 mg/dL (ref 0.2–1.2)
Total Protein: 6.7 g/dL (ref 6.1–8.1)

## 2017-05-06 LAB — CBC
HCT: 46.2 % (ref 38.5–50.0)
Hemoglobin: 16.1 g/dL (ref 13.2–17.1)
MCH: 30.8 pg (ref 27.0–33.0)
MCHC: 34.8 g/dL (ref 32.0–36.0)
MCV: 88.3 fL (ref 80.0–100.0)
MPV: 11.1 fL (ref 7.5–12.5)
Platelets: 216 Thousand/uL (ref 140–400)
RBC: 5.23 10*6/uL (ref 4.20–5.80)
RDW: 13 % (ref 11.0–15.0)
WBC: 5.1 10*3/uL (ref 3.8–10.8)

## 2017-05-06 LAB — VITAMIN B12: Vitamin B-12: 189 pg/mL — ABNORMAL LOW (ref 200–1100)

## 2017-05-06 LAB — HEMOGLOBIN A1C
Hgb A1c MFr Bld: 5.6 % of total Hgb (ref <5.7)
Mean Plasma Glucose: 114 (calc)
eAG (mmol/L): 6.3 (calc)

## 2017-05-06 LAB — LIPID PANEL W/REFLEX DIRECT LDL
Cholesterol: 247 mg/dL — ABNORMAL HIGH (ref <200)
HDL: 53 mg/dL (ref >40)
LDL Cholesterol (Calc): 175 mg/dL — ABNORMAL HIGH
Non-HDL Cholesterol (Calc): 194 mg/dL (calc) — ABNORMAL HIGH (ref <130)
Total CHOL/HDL Ratio: 4.7 (calc) (ref <5.0)
Triglycerides: 81 mg/dL (ref <150)

## 2017-05-06 LAB — TSH: TSH: 2.29 m[IU]/L (ref 0.40–4.50)

## 2017-05-06 MED ORDER — ATORVASTATIN CALCIUM 40 MG PO TABS
40.0000 mg | ORAL_TABLET | Freq: Every day | ORAL | 3 refills | Status: DC
Start: 2017-05-06 — End: 2017-08-20

## 2017-05-06 NOTE — Assessment & Plan Note (Signed)
Patient has been doing some oral supplementation, persistently low vitamin B12 level indicates malabsorption of B12, he will be switching to intramuscular injections of the thousand micrograms q. monthly.

## 2017-05-06 NOTE — Assessment & Plan Note (Signed)
Atorvastatin 40.

## 2017-05-06 NOTE — Addendum Note (Signed)
Addended by: Silverio Decamp on: 05/06/2017 08:48 AM   Modules accepted: Orders

## 2017-05-19 ENCOUNTER — Ambulatory Visit: Payer: Medicare HMO

## 2017-05-19 DIAGNOSIS — H52223 Regular astigmatism, bilateral: Secondary | ICD-10-CM | POA: Diagnosis not present

## 2017-05-22 ENCOUNTER — Ambulatory Visit (INDEPENDENT_AMBULATORY_CARE_PROVIDER_SITE_OTHER): Payer: Medicare HMO | Admitting: Sports Medicine

## 2017-05-22 VITALS — BP 169/86 | HR 71 | Temp 98.7°F | Resp 16 | Wt 253.1 lb

## 2017-05-22 DIAGNOSIS — E538 Deficiency of other specified B group vitamins: Secondary | ICD-10-CM

## 2017-05-22 DIAGNOSIS — I10 Essential (primary) hypertension: Secondary | ICD-10-CM

## 2017-05-22 MED ORDER — CYANOCOBALAMIN 1000 MCG/ML IJ SOLN
1000.0000 ug | Freq: Once | INTRAMUSCULAR | Status: AC
  Administered 2017-05-22: 1000 ug via INTRAMUSCULAR

## 2017-05-22 NOTE — Assessment & Plan Note (Signed)
Patient desires to minimize his B12 injections, does not desire to do them himself at home, so I am okay with him doing 1000 mcg monthly.

## 2017-05-22 NOTE — Progress Notes (Signed)
HPI: Patient is here for blood pressure check and B12 injection. Patient denies chest pain, shortness of breath, blurred vision, palpitations, dizziness, gastrointestinal or medication problems.  Assessment and Plan: Patient advised of elevated blood pressure. Per provider Amlodipine 5 mg increased to 10 mg orally daily. Patient also received his first B 12 1000 mg injection in the left  Deltoid. Patient advised to scheduled monthly B 12 injections and 2 week blood pressure check.

## 2017-05-22 NOTE — Assessment & Plan Note (Signed)
Increase amlodipine to 10 mg daily. Return in 2 weeks for a blood pressure check. He does have multiple intolerances to other blood pressure medications.

## 2017-06-02 ENCOUNTER — Ambulatory Visit: Payer: Medicare HMO | Admitting: Sports Medicine

## 2017-06-05 ENCOUNTER — Ambulatory Visit: Payer: Medicare HMO

## 2017-06-05 ENCOUNTER — Ambulatory Visit: Payer: Medicare HMO | Admitting: Sports Medicine

## 2017-06-05 DIAGNOSIS — E538 Deficiency of other specified B group vitamins: Secondary | ICD-10-CM | POA: Insufficient documentation

## 2017-06-11 DIAGNOSIS — H33011 Retinal detachment with single break, right eye: Secondary | ICD-10-CM | POA: Diagnosis not present

## 2017-06-12 DIAGNOSIS — H33021 Retinal detachment with multiple breaks, right eye: Secondary | ICD-10-CM | POA: Diagnosis not present

## 2017-06-12 DIAGNOSIS — H43813 Vitreous degeneration, bilateral: Secondary | ICD-10-CM | POA: Diagnosis not present

## 2017-06-12 DIAGNOSIS — H35421 Microcystoid degeneration of retina, right eye: Secondary | ICD-10-CM | POA: Diagnosis not present

## 2017-06-13 DIAGNOSIS — H33021 Retinal detachment with multiple breaks, right eye: Secondary | ICD-10-CM | POA: Diagnosis not present

## 2017-06-19 ENCOUNTER — Ambulatory Visit (INDEPENDENT_AMBULATORY_CARE_PROVIDER_SITE_OTHER): Payer: Medicare HMO | Admitting: Sports Medicine

## 2017-06-19 VITALS — BP 145/75 | HR 70 | Temp 98.9°F | Resp 16 | Wt 253.8 lb

## 2017-06-19 DIAGNOSIS — I10 Essential (primary) hypertension: Secondary | ICD-10-CM

## 2017-06-19 DIAGNOSIS — E538 Deficiency of other specified B group vitamins: Secondary | ICD-10-CM | POA: Diagnosis not present

## 2017-06-19 MED ORDER — CYANOCOBALAMIN 1000 MCG/ML IJ SOLN
1000.0000 ug | Freq: Once | INTRAMUSCULAR | Status: AC
  Administered 2017-06-19: 1000 ug via INTRAMUSCULAR

## 2017-06-19 NOTE — Progress Notes (Signed)
Called patient  - Per provider, advised to schedule nurse follow in 2 weeks. Patient stated he understood  - transferred to schedule appt.

## 2017-06-19 NOTE — Assessment & Plan Note (Signed)
Continue 10 mg of amlodipine. Blood pressure is borderline today. Considering he just had his retinal procedure we are going to simply watch this, recheck in a nurse visit in 2 weeks.

## 2017-06-19 NOTE — Progress Notes (Signed)
HPI; Patient is here for for a Vitamin B 12 injection. Denies gastrointestinal problems, dizziness, chest pain, palpitations, shortness of breath or medication problems.  Assessment and Plan: Patient tolerated injection  - Right deltoid - well without complications. Patient advised to schedule next injection in 30 days. Per provider - patient also advised once his chart is reviewed, he will be contact regarding his blood pressure readings. Patient stated he understood - elevated blood pressure may be due to recent eye surgery  - right detached retina.

## 2017-06-24 ENCOUNTER — Other Ambulatory Visit: Payer: Self-pay | Admitting: Sports Medicine

## 2017-06-24 DIAGNOSIS — I10 Essential (primary) hypertension: Secondary | ICD-10-CM

## 2017-06-24 MED ORDER — AMLODIPINE BESYLATE 10 MG PO TABS: 10.0000 mg | ORAL_TABLET | Freq: Every day | ORAL | 3 refills | Status: DC

## 2017-07-03 ENCOUNTER — Ambulatory Visit: Payer: Medicare HMO | Admitting: Sports Medicine

## 2017-07-21 ENCOUNTER — Ambulatory Visit (INDEPENDENT_AMBULATORY_CARE_PROVIDER_SITE_OTHER): Payer: Medicare HMO | Admitting: Sports Medicine

## 2017-07-21 DIAGNOSIS — I10 Essential (primary) hypertension: Secondary | ICD-10-CM

## 2017-07-21 MED ORDER — VALSARTAN 320 MG PO TABS: 160.0000 mg | ORAL_TABLET | Freq: Every day | ORAL | 3 refills | Status: DC

## 2017-07-21 NOTE — Progress Notes (Signed)
   Subjective:    Patient ID: Robert Guerrero, male    DOB: [birth year], 67 y.o.   MRN: [id number]  HPI  Robert Guerrero is here for a vitamin B 12 injection. Denies muscle cramps, weakness, chest pain, shortness of breath or headaches or irregular heart rate. His blood pressure is elevated. He does report some dizziness at times.   Review of Systems     Objective:   Physical Exam        Assessment & Plan:  Patient tolerated injection well without complications. Patient advised to schedule next injection 30 days from today.   HTN - Patient advised to take 1/2 tablet of the valsartan in addition with the amlodipine, per Dr Dianah Field. See Dr Landry Corporal note. Patient advised to follow up in 2 weeks.

## 2017-07-21 NOTE — Assessment & Plan Note (Signed)
Still uncontrolled on amlodipine, restarting valsartan, return in 2 weeks for a blood pressure check

## 2017-08-05 ENCOUNTER — Ambulatory Visit (INDEPENDENT_AMBULATORY_CARE_PROVIDER_SITE_OTHER): Payer: Medicare HMO | Admitting: Sports Medicine

## 2017-08-05 VITALS — BP 156/67 | HR 72 | Temp 98.4°F | Wt 263.3 lb

## 2017-08-05 DIAGNOSIS — I10 Essential (primary) hypertension: Secondary | ICD-10-CM

## 2017-08-05 MED ORDER — VALSARTAN 320 MG PO TABS: 320.0000 mg | ORAL_TABLET | Freq: Every day | ORAL | 3 refills | Status: DC

## 2017-08-05 NOTE — Assessment & Plan Note (Signed)
Currently taking 1/2 tablet of the 320 mg valsartan, increasing to a full tablet, continue amlodipine, he will see me in about 2 weeks.

## 2017-08-05 NOTE — Progress Notes (Signed)
As per pt, he was taking 1/2 tab for valsartan. Pt has been updated re: increase in valsartan medication & is aware to take 1 tab daily. Pt informed to keep scheduled appt on 08/20/17.

## 2017-08-05 NOTE — Progress Notes (Signed)
Pt presented today for blood pressure check. Denies chest pain or palpitations. Pt stopped taking atorvastatin, states "he is trying not take a bunch of medications".He continues to have dizziness, lightheadedness & SOB every time he takes his blood pressure medications.  He is taking his bp meds as directed, with no missed dose. As per pt, " he feels out of it when trying to work". He also stated that when he checks his blood pressure at home, reading ranges from 110's/60's. Pt did not have home readings on hand to review. Today's reading was 156/67, pulse 72. After 10 mins, blood pressure was rechecked - second blood pressure reading was 144/65, pulse 70. As per Dr. Darene Lamer, bp meds will be increased. Pt has an appt on 08/20/17 to follow up on SOB, lightheadedness & dizziness w/Dr. T. Pt aware we will call with any changes & updates.

## 2017-08-20 ENCOUNTER — Encounter: Payer: Self-pay | Admitting: Sports Medicine

## 2017-08-20 ENCOUNTER — Ambulatory Visit (INDEPENDENT_AMBULATORY_CARE_PROVIDER_SITE_OTHER): Payer: Medicare HMO | Admitting: Sports Medicine

## 2017-08-20 VITALS — BP 135/73 | HR 73 | Resp 18 | Wt 258.0 lb

## 2017-08-20 DIAGNOSIS — E538 Deficiency of other specified B group vitamins: Secondary | ICD-10-CM | POA: Diagnosis not present

## 2017-08-20 DIAGNOSIS — I10 Essential (primary) hypertension: Secondary | ICD-10-CM | POA: Diagnosis not present

## 2017-08-20 DIAGNOSIS — E7800 Pure hypercholesterolemia, unspecified: Secondary | ICD-10-CM

## 2017-08-20 DIAGNOSIS — Z23 Encounter for immunization: Secondary | ICD-10-CM | POA: Diagnosis not present

## 2017-08-20 DIAGNOSIS — Z0000 Encounter for general adult medical examination without abnormal findings: Secondary | ICD-10-CM

## 2017-08-20 MED ORDER — CYANOCOBALAMIN 1000 MCG/ML IJ SOLN
1000.0000 ug | Freq: Once | INTRAMUSCULAR | Status: AC
  Administered 2017-08-20: 1000 ug via INTRAMUSCULAR

## 2017-08-20 MED ORDER — OMEGA-3-ACID ETHYL ESTERS 1 G PO CAPS: 2.0000 g | ORAL_CAPSULE | Freq: Two times a day (BID) | ORAL | 3 refills | Status: DC

## 2017-08-20 NOTE — Addendum Note (Signed)
Addended by: Elizabeth Sauer on: 08/20/2017 09:24 AM   Modules accepted: Orders

## 2017-08-20 NOTE — Assessment & Plan Note (Signed)
Refuses to take a statin. LDL is 170. He is agreeable to do fish oil, adding Lovaza 2 g twice a day, recheck in 6 months. Risks, benefits explained.  Patient understands.

## 2017-08-20 NOTE — Progress Notes (Signed)
Subjective:    CC: Follow-up  HPI: Hypertension: Controlled.  Dizziness: Sounds more vertiginous, room spinning, feeling off balance, he does have B12 deficiency which is currently being supplemented.  Hyperlipidemia: Refuses to take a statin, LDL of 170.  He understands the risks.  Agrees to do fish oil instead.  Preventive measures: Up-to-date on colon cancer screening, pneumococcal 23 today.  I reviewed the past medical history, family history, social history, surgical history, and allergies today and no changes were needed.  Please see the problem list section below in epic for further details.  Past Medical History: Past Medical History:  Diagnosis Date  . Hyperlipidemia   . Hypertension   . Osteoarthritis of right knee   . Right lumbar radiculitis   . SVT (supraventricular tachycardia) (HCC)    Past Surgical History: No past surgical history on file. Social History: Social History   Socioeconomic History  . Marital status: Married    Spouse name: Not on file  . Number of children: 2  . Years of education: Not on file  . Highest education level: Not on file  Occupational History    Employer: [employer]    Comment: Regulatory affairs officer  Social Needs  . Financial resource strain: Not on file  . Food insecurity:    Worry: Not on file    Inability: Not on file  . Transportation needs:    Medical: Not on file    Non-medical: Not on file  Tobacco Use  . Smoking status: Never Smoker  . Smokeless tobacco: Never Used  Substance and Sexual Activity  . Alcohol use: Yes    Comment: Occasional  . Drug use: Not on file  . Sexual activity: Not on file  Lifestyle  . Physical activity:    Days per week: Not on file    Minutes per session: Not on file  . Stress: Not on file  Relationships  . Social connections:    Talks on phone: Not on file    Gets together: Not on file    Attends religious service: Not on file    Active member of club or organization:  Not on file    Attends meetings of clubs or organizations: Not on file    Relationship status: Not on file  Other Topics Concern  . Not on file  Social History Narrative  . Not on file   Family History: Family History  Problem Relation Age of Onset  . CAD Father        MI at age 49-60   Allergies: Allergies  Allergen Reactions  . Penicillins Hives   Medications: See med rec.  Review of Systems: No fevers, chills, night sweats, weight loss, chest pain, or shortness of breath.   Objective:    General: Well Developed, well nourished, and in no acute distress.  Neuro: Alert and oriented x3, extra-ocular muscles intact, sensation grossly intact.  HEENT: Normocephalic, atraumatic, pupils equal round reactive to light, neck supple, no masses, no lymphadenopathy, thyroid nonpalpable.  Skin: Warm and dry, no rashes. Cardiac: Regular rate and rhythm, no murmurs rubs or gallops, no lower extremity edema.  Respiratory: Clear to auscultation bilaterally. Not using accessory muscles, speaking in full sentences.  Impression and Recommendations:    Essential hypertension Finally controlled, I think his dizziness is likely more vertiginous than orthostatic. No changes in blood pressure medication.  Hyperlipidemia Refuses to take a statin. LDL is 170. He is agreeable to do fish oil, adding Lovaza 2 g  twice a day, recheck in 6 months. Risks, benefits explained.  Patient understands.  Annual physical exam Pneumonia 23 vaccine today. B12 today. Cologuard done a year and a half ago.  Negative per patient report. ___________________________________________ Gwen Her. Dianah Field, M.D., ABFM., CAQSM. Primary Care and [place] Instructor of [employer] [facility] [employer]

## 2017-08-20 NOTE — Assessment & Plan Note (Signed)
Finally controlled, I think his dizziness is likely more vertiginous than orthostatic. No changes in blood pressure medication.

## 2017-08-20 NOTE — Assessment & Plan Note (Signed)
Pneumonia 23 vaccine today. B12 today. Cologuard done a year and a half ago.  Negative per patient report.

## 2017-08-22 ENCOUNTER — Other Ambulatory Visit: Payer: Self-pay | Admitting: Sports Medicine

## 2017-08-22 DIAGNOSIS — I10 Essential (primary) hypertension: Secondary | ICD-10-CM

## 2017-08-22 MED ORDER — VALSARTAN 320 MG PO TABS: 320.0000 mg | ORAL_TABLET | Freq: Every day | ORAL | 3 refills | Status: DC

## 2017-10-01 DIAGNOSIS — I10 Essential (primary) hypertension: Secondary | ICD-10-CM | POA: Diagnosis not present

## 2017-10-01 DIAGNOSIS — M549 Dorsalgia, unspecified: Secondary | ICD-10-CM | POA: Diagnosis not present

## 2017-10-16 DIAGNOSIS — I10 Essential (primary) hypertension: Secondary | ICD-10-CM | POA: Diagnosis not present

## 2017-11-25 DIAGNOSIS — I10 Essential (primary) hypertension: Secondary | ICD-10-CM | POA: Diagnosis not present

## 2018-02-12 DIAGNOSIS — H35421 Microcystoid degeneration of retina, right eye: Secondary | ICD-10-CM | POA: Diagnosis not present

## 2018-02-12 DIAGNOSIS — H2513 Age-related nuclear cataract, bilateral: Secondary | ICD-10-CM | POA: Diagnosis not present

## 2018-02-12 DIAGNOSIS — H43813 Vitreous degeneration, bilateral: Secondary | ICD-10-CM | POA: Diagnosis not present

## 2018-02-20 ENCOUNTER — Encounter: Payer: Self-pay | Admitting: Sports Medicine

## 2018-02-20 ENCOUNTER — Ambulatory Visit (INDEPENDENT_AMBULATORY_CARE_PROVIDER_SITE_OTHER): Payer: Medicare HMO | Admitting: Sports Medicine

## 2018-02-20 DIAGNOSIS — E7800 Pure hypercholesterolemia, unspecified: Secondary | ICD-10-CM | POA: Diagnosis not present

## 2018-02-20 DIAGNOSIS — R7989 Other specified abnormal findings of blood chemistry: Secondary | ICD-10-CM

## 2018-02-20 DIAGNOSIS — E538 Deficiency of other specified B group vitamins: Secondary | ICD-10-CM

## 2018-02-20 DIAGNOSIS — E669 Obesity, unspecified: Secondary | ICD-10-CM | POA: Diagnosis not present

## 2018-02-20 DIAGNOSIS — I10 Essential (primary) hypertension: Secondary | ICD-10-CM

## 2018-02-20 LAB — COMPREHENSIVE METABOLIC PANEL
AST: 22 U/L (ref 10–35)
Albumin: 4.3 g/dL (ref 3.6–5.1)
Alkaline phosphatase (APISO): 74 U/L (ref 40–115)
BUN: 16 mg/dL (ref 7–25)
CO2: 25 mmol/L (ref 20–32)
Creat: 1.09 mg/dL (ref 0.70–1.25)
Globulin: 2.3 g/dL (calc) (ref 1.9–3.7)
Glucose, Bld: 90 mg/dL (ref 65–99)
Sodium: 138 mmol/L (ref 135–146)

## 2018-02-20 LAB — COMPREHENSIVE METABOLIC PANEL WITH GFR
AG Ratio: 1.9 (calc) (ref 1.0–2.5)
ALT: 21 U/L (ref 9–46)
Calcium: 9.2 mg/dL (ref 8.6–10.3)
Chloride: 104 mmol/L (ref 98–110)
Potassium: 4.3 mmol/L (ref 3.5–5.3)
Total Bilirubin: 0.7 mg/dL (ref 0.2–1.2)
Total Protein: 6.6 g/dL (ref 6.1–8.1)

## 2018-02-20 LAB — CBC
HCT: 47.7 % (ref 38.5–50.0)
Hemoglobin: 16.1 g/dL (ref 13.2–17.1)
MCH: 30.4 pg (ref 27.0–33.0)
MCHC: 33.8 g/dL (ref 32.0–36.0)
MCV: 90.2 fL (ref 80.0–100.0)
MPV: 11.3 fL (ref 7.5–12.5)
Platelets: 209 10*3/uL (ref 140–400)
RBC: 5.29 10*6/uL (ref 4.20–5.80)
RDW: 13.4 % (ref 11.0–15.0)
WBC: 4.4 Thousand/uL (ref 3.8–10.8)

## 2018-02-20 LAB — LIPID PANEL W/REFLEX DIRECT LDL
Cholesterol: 258 mg/dL — ABNORMAL HIGH (ref <200)
HDL: 50 mg/dL (ref >40)
LDL Cholesterol (Calc): 190 mg/dL — ABNORMAL HIGH
Non-HDL Cholesterol (Calc): 208 mg/dL (calc) — ABNORMAL HIGH (ref <130)
Total CHOL/HDL Ratio: 5.2 (calc) — ABNORMAL HIGH (ref <5.0)
Triglycerides: 75 mg/dL (ref <150)

## 2018-02-20 LAB — TSH: TSH: 1.53 mIU/L (ref 0.40–4.50)

## 2018-02-20 NOTE — Assessment & Plan Note (Signed)
Currently getting various injections at a weight loss clinic, he will find out what he is getting injected. He has lost 10 pounds.

## 2018-02-20 NOTE — Assessment & Plan Note (Signed)
He is at a weight loss clinic getting B12 injections, no more needed today from [place].

## 2018-02-20 NOTE — Assessment & Plan Note (Signed)
Well-controlled on recheck, no changes in medication.

## 2018-02-20 NOTE — Assessment & Plan Note (Addendum)
Noncompliant with Lovaza. Rechecking lipids, if still elevated we will add fenofibrate. Refuses statins.  Lipids, particularly LDL is highly elevated, I REALLY think he should give statins another thought, if not, maybe we can try Repatha.  Patient declines statins, adding prescription for Repatha, diagnosis = hyperlipidemia not at goal with maximum dose statins, intolerance to statins, as well as atherosclerotic cardiovascular disease.

## 2018-02-20 NOTE — Progress Notes (Addendum)
Subjective:    CC: Follow-up  HPI: Hyperlipidemia: Has done some weight loss treatments, agreeable to recheck lipids.  Hypertension: Under adequate control.  I reviewed the past medical history, family history, social history, surgical history, and allergies today and no changes were needed.  Please see the problem list section below in epic for further details.  Past Medical History: Past Medical History:  Diagnosis Date  . Hyperlipidemia   . Hypertension   . Osteoarthritis of right knee   . Right lumbar radiculitis   . SVT (supraventricular tachycardia) (HCC)    Past Surgical History: No past surgical history on file. Social History: Social History   Socioeconomic History  . Marital status: Married    Spouse name: Not on file  . Number of children: 2  . Years of education: Not on file  . Highest education level: Not on file  Occupational History    Employer: [employer]    Comment: Regulatory affairs officer  Social Needs  . Financial resource strain: Not on file  . Food insecurity:    Worry: Not on file    Inability: Not on file  . Transportation needs:    Medical: Not on file    Non-medical: Not on file  Tobacco Use  . Smoking status: Never Smoker  . Smokeless tobacco: Never Used  Substance and Sexual Activity  . Alcohol use: Yes    Comment: Occasional  . Drug use: Not on file  . Sexual activity: Not on file  Lifestyle  . Physical activity:    Days per week: Not on file    Minutes per session: Not on file  . Stress: Not on file  Relationships  . Social connections:    Talks on phone: Not on file    Gets together: Not on file    Attends religious service: Not on file    Active member of club or organization: Not on file    Attends meetings of clubs or organizations: Not on file    Relationship status: Not on file  Other Topics Concern  . Not on file  Social History Narrative  . Not on file   Family History: Family History  Problem  Relation Age of Onset  . CAD Father        MI at age 10-60   Allergies: Allergies  Allergen Reactions  . Penicillins Hives   Medications: See med rec.  Review of Systems: No fevers, chills, night sweats, weight loss, chest pain, or shortness of breath.   Objective:    General: Well Developed, well nourished, and in no acute distress.  Neuro: Alert and oriented x3, extra-ocular muscles intact, sensation grossly intact.  HEENT: Normocephalic, atraumatic, pupils equal round reactive to light, neck supple, no masses, no lymphadenopathy, thyroid nonpalpable.  Skin: Warm and dry, no rashes. Cardiac: Regular rate and rhythm, no murmurs rubs or gallops, no lower extremity edema.  Respiratory: Clear to auscultation bilaterally. Not using accessory muscles, speaking in full sentences.  Impression and Recommendations:    Hyperlipidemia Noncompliant with Lovaza. Rechecking lipids, if still elevated we will add fenofibrate. Refuses statins.  Lipids, particularly LDL is highly elevated, I REALLY think he should give statins another thought, if not, maybe we can try Repatha.  Patient declines statins, adding prescription for Repatha, diagnosis = hyperlipidemia not at goal with maximum dose statins, intolerance to statins, as well as atherosclerotic cardiovascular disease.  Low vitamin B12 level He is at a weight loss clinic getting B12 injections,  no more needed today from [place].   Obesity Currently getting various injections at a weight loss clinic, he will find out what he is getting injected. He has lost 10 pounds.  Essential hypertension Well-controlled on recheck, no changes in medication.  I spent 25 minutes with this patient, greater than 50% was face-to-face time counseling regarding the above diagnoses, specifically discussing the treatment plan for the above multiple diagnoses. ___________________________________________ Gwen Her. Dianah Field, M.D., ABFM., CAQSM. Primary Care  and Sports Medicine [facility]  Adjunct Professor of [employer] [facility] [employer]

## 2018-03-05 ENCOUNTER — Telehealth: Payer: Self-pay | Admitting: Sports Medicine

## 2018-03-05 MED ORDER — EVOLOCUMAB 140 MG/ML ~~LOC~~ SOSY: 140.0000 mg | PREFILLED_SYRINGE | SUBCUTANEOUS | 11 refills | Status: DC

## 2018-03-05 NOTE — Telephone Encounter (Signed)
Received fax from [employer] that Repatha requires a PA. Information has been sent to the insurance company. Awaiting determination.   

## 2018-03-05 NOTE — Addendum Note (Signed)
Addended by: Silverio Decamp on: 03/05/2018 11:53 AM   Modules accepted: Orders

## 2018-03-10 NOTE — Telephone Encounter (Signed)
Received fax that Repatha has been approved. Pharmacy aware.

## 2018-03-11 ENCOUNTER — Other Ambulatory Visit: Payer: Self-pay

## 2018-03-11 DIAGNOSIS — E7800 Pure hypercholesterolemia, unspecified: Secondary | ICD-10-CM

## 2018-03-11 MED ORDER — OMEGA-3-ACID ETHYL ESTERS 1 G PO CAPS: 2.0000 g | ORAL_CAPSULE | Freq: Two times a day (BID) | ORAL | 3 refills | Status: DC

## 2018-05-11 ENCOUNTER — Ambulatory Visit (INDEPENDENT_AMBULATORY_CARE_PROVIDER_SITE_OTHER): Payer: Medicare HMO | Admitting: Sports Medicine

## 2018-05-11 ENCOUNTER — Encounter: Payer: Self-pay | Admitting: Sports Medicine

## 2018-05-11 VITALS — BP 146/74 | HR 69 | Ht 71.0 in | Wt 257.0 lb

## 2018-05-11 DIAGNOSIS — Z0000 Encounter for general adult medical examination without abnormal findings: Secondary | ICD-10-CM | POA: Diagnosis not present

## 2018-05-11 DIAGNOSIS — I10 Essential (primary) hypertension: Secondary | ICD-10-CM | POA: Diagnosis not present

## 2018-05-11 DIAGNOSIS — Z23 Encounter for immunization: Secondary | ICD-10-CM

## 2018-05-11 DIAGNOSIS — M1711 Unilateral primary osteoarthritis, right knee: Secondary | ICD-10-CM | POA: Diagnosis not present

## 2018-05-11 DIAGNOSIS — E538 Deficiency of other specified B group vitamins: Secondary | ICD-10-CM

## 2018-05-11 DIAGNOSIS — E7800 Pure hypercholesterolemia, unspecified: Secondary | ICD-10-CM

## 2018-05-11 DIAGNOSIS — R7989 Other specified abnormal findings of blood chemistry: Secondary | ICD-10-CM

## 2018-05-11 MED ORDER — CELECOXIB 200 MG PO CAPS
ORAL_CAPSULE | ORAL | 2 refills | Status: DC
Start: 2018-05-11 — End: 2020-05-29

## 2018-05-11 MED ORDER — VALSARTAN-HYDROCHLOROTHIAZIDE 320-12.5 MG PO TABS: 1.0000 | ORAL_TABLET | Freq: Every day | ORAL | 3 refills | Status: DC

## 2018-05-11 MED ORDER — ZOSTER VAC RECOMB ADJUVANTED 50 MCG/0.5ML IM SUSR: INTRAMUSCULAR | 0 refills | Status: DC

## 2018-05-11 NOTE — Assessment & Plan Note (Signed)
Increasing to valsartan/HCTZ. Continue amlodipine. Continue to recheck blood pressures at home as well.

## 2018-05-11 NOTE — Assessment & Plan Note (Signed)
Is not taking Repatha as ordered. He is only doing Lovaza. Rechecking lipids, if persistently elevated he will go ahead and start Repatha, he declines any further statins.

## 2018-05-11 NOTE — Assessment & Plan Note (Addendum)
Medicare physical as above. Flu shot today. Prescription sent to pharmacy for Shingrix 2 shot series.

## 2018-05-11 NOTE — Assessment & Plan Note (Addendum)
Has been getting B12 injections at a weight loss clinic. Rechecking today to ensure this is actually happening.

## 2018-05-11 NOTE — Assessment & Plan Note (Signed)
Widespread osteoarthritis. He did well after a series of Euflexxa about 3 years ago. Adding Celebrex.

## 2018-05-11 NOTE — Progress Notes (Signed)
Subjective:   Robert Guerrero is a 68 y.o. male who presents for Medicare Annual/Subsequent preventive examination.  Review of Systems:  Comprehensive review of systems is negative except as noted below.     Objective:    Vitals: BP (!) 146/74   Pulse 69   Ht 5\' 11"  (1.803 m)   Wt 257 lb (116.6 kg)   BMI 35.84 kg/m   Body mass index is 35.84 kg/m.  Advanced Directives 05/30/2016  Does Patient Have a Medical Advance Directive? No  Would patient like information on creating a medical advance directive? No - Patient declined    Tobacco Social History   Tobacco Use  Smoking Status Never Smoker  Smokeless Tobacco Never Used     Counseling given: Not Answered   Clinical Intake:  Hypertension: Uncontrolled.  Hyperlipidemia: Not taking Repatha as instructed.  Only doing Lovaza.  Polyarthralgia: Known knee osteoarthritis, this is doing well after a series of Euflexxa, he is having mild discomfort in his elbows, knees, hips, back.  Not taking any NSAIDs.  Past Medical History:  Diagnosis Date  . Hyperlipidemia   . Hypertension   . Osteoarthritis of right knee   . Right lumbar radiculitis   . SVT (supraventricular tachycardia) (HCC)    No past surgical history on file. Family History  Problem Relation Age of Onset  . CAD Father        MI at age 47-60   Social History   Socioeconomic History  . Marital status: Married    Spouse name: Not on file  . Number of children: 2  . Years of education: Not on file  . Highest education level: Not on file  Occupational History    Employer: [employer]    Comment: Regulatory affairs officer  Social Needs  . Financial resource strain: Not on file  . Food insecurity:    Worry: Not on file    Inability: Not on file  . Transportation needs:    Medical: Not on file    Non-medical: Not on file  Tobacco Use  . Smoking status: Never Smoker  . Smokeless tobacco: Never Used  Substance and Sexual Activity  . Alcohol  use: Yes    Comment: Occasional  . Drug use: Not on file  . Sexual activity: Not on file  Lifestyle  . Physical activity:    Days per week: Not on file    Minutes per session: Not on file  . Stress: Not on file  Relationships  . Social connections:    Talks on phone: Not on file    Gets together: Not on file    Attends religious service: Not on file    Active member of club or organization: Not on file    Attends meetings of clubs or organizations: Not on file    Relationship status: Not on file  Other Topics Concern  . Not on file  Social History Narrative  . Not on file    Outpatient Encounter Medications as of 05/11/2018  Medication Sig  . amLODipine (NORVASC) 10 MG tablet Take 1 tablet (10 mg total) by mouth daily.  Marland Kitchen omega-3 acid ethyl esters (LOVAZA) 1 g capsule Take 2 capsules (2 g total) by mouth 2 (two) times daily.  . [DISCONTINUED] valsartan (DIOVAN) 320 MG tablet Take 1 tablet (320 mg total) by mouth daily.  . celecoxib (CELEBREX) 200 MG capsule One to 2 tablets by mouth daily as needed for pain.  . cyanocobalamin (,  VITAMIN B-12,) 1000 MCG/ML injection Inject 1,000 mcg into the muscle every 30 (thirty) days. Injection 1000 mcg intramuscular weekly for 1 month and then monthly thereafter  . valsartan-hydrochlorothiazide (DIOVAN-HCT) 320-12.5 MG tablet Take 1 tablet by mouth daily.  Marland Kitchen Zoster Vaccine Adjuvanted [facility]) injection Administer at 0 and 2-6 months for 2 total doses.  To be administered by pharmacy.  . [DISCONTINUED] Evolocumab (REPATHA) 140 MG/ML SOSY Inject 140 mg into the skin every 14 (fourteen) days.  . [DISCONTINUED] Vitamin D, Ergocalciferol, (DRISDOL) 50000 units CAPS capsule Take 50,000 Units by mouth every 7 (seven) days.   No facility-administered encounter medications on file as of 05/11/2018.     Activities of Daily Living Independent with activities of daily living and instrumental activities of daily living  Patient Care  Team: Silverio Decamp, MD as PCP - General (Sports Medicine) Silverio Decamp, MD as Consulting Physician (Sports Medicine)   Assessment:   This is a routine wellness examination for [place].  Exercise Activities and Dietary recommendations  We discussed weight loss, low-carb diet.  He was getting B12 injections at a weight loss clinic in the past.  Rechecking levels today.  Goals   None     Fall Risk Fall Risk  05/05/2017  Falls in the past year? No   Is the patient's home free of loose throw rugs in walkways, pet beds, electrical cords, etc?   yes      Grab bars in the bathroom? no      Handrails on the stairs?   yes      Adequate lighting?   yes  Timed Get Up and Go Performed: Not needed.  Depression Screen PHQ 2/9 Scores 05/11/2018 05/05/2017 07/02/2016  PHQ - 2 Score 2 0 4  PHQ- 9 Score 6 2 14     Cognitive Function  Adequate, no deficiencies.  Functional independently at home.      Immunization History  Administered Date(s) Administered  . Influenza, High Dose Seasonal PF 05/05/2017, 05/11/2018  . Influenza,inj,Quad PF,6+ Mos 05/16/2016  . Pneumococcal Conjugate-13 05/16/2016  . Pneumococcal Polysaccharide-23 08/20/2017  . Tdap 05/16/2016    Qualifies for Shingles Vaccine? Yes  Screening Tests Health Maintenance  Topic Date Due  . INFLUENZA VACCINE  10/30/2017  . Fecal DNA (Cologuard)  04/02/2019  . TETANUS/TDAP  05/16/2026  . Hepatitis C Screening  Completed  . PNA vac Low Risk Adult  Completed   Cancer Screenings: Lung: Low Dose CT Chest recommended if Age 31-80 years, 30 pack-year currently smoking OR have quit w/in 15years. Patient does not qualify. Colorectal: Up to date.  Additional Screenings:  Hepatitis C Screening: Done.     Plan:   Healthy male.  I have personally reviewed and noted the following in the patient's chart:   . Medical and social history . Use of alcohol, tobacco or illicit drugs  . Current medications and  supplements . Functional ability and status . Nutritional status . Physical activity . Advanced directives . List of other physicians . Hospitalizations, surgeries, and ER visits in previous 12 months . Vitals . Screenings to include cognitive, depression, and falls . Referrals and appointments  In addition, I have reviewed and discussed with patient certain preventive protocols, quality metrics, and best practice recommendations. A written personalized care plan for preventive services as well as general preventive health recommendations were provided to patient.  Annual physical exam Medicare physical as above. Flu shot today. Prescription sent to pharmacy for Shingrix 2 shot series.  Essential  hypertension Increasing to valsartan/HCTZ. Continue amlodipine. Continue to recheck blood pressures at home as well.  Hyperlipidemia Is not taking Repatha as ordered. He is only doing Lovaza. Rechecking lipids, if persistently elevated he will go ahead and start Repatha, he declines any further statins.   Low vitamin B12 level Has been getting B12 injections at a weight loss clinic. Rechecking today to ensure this is actually happening.  Osteoarthritis of right knee Widespread osteoarthritis. He did well after a series of Euflexxa about 3 years ago. Adding Celebrex.    Aundria Mems, MD  05/11/2018

## 2018-05-12 LAB — COMPREHENSIVE METABOLIC PANEL
ALT: 28 U/L (ref 9–46)
AST: 26 U/L (ref 10–35)
Alkaline phosphatase (APISO): 75 U/L (ref 35–144)
CO2: 24 mmol/L (ref 20–32)
Calcium: 9.5 mg/dL (ref 8.6–10.3)
Creat: 1.11 mg/dL (ref 0.70–1.25)
Glucose, Bld: 88 mg/dL (ref 65–99)

## 2018-05-12 LAB — CBC
HCT: 47.7 % (ref 38.5–50.0)
Hemoglobin: 16.2 g/dL (ref 13.2–17.1)
MCH: 30.9 pg (ref 27.0–33.0)
MCHC: 34 g/dL (ref 32.0–36.0)
MCV: 90.9 fL (ref 80.0–100.0)
MPV: 11.1 fL (ref 7.5–12.5)
Platelets: 208 10*3/uL (ref 140–400)
RBC: 5.25 10*6/uL (ref 4.20–5.80)
RDW: 13.1 % (ref 11.0–15.0)
WBC: 5.3 10*3/uL (ref 3.8–10.8)

## 2018-05-12 LAB — COMPREHENSIVE METABOLIC PANEL WITH GFR
AG Ratio: 1.6 (calc) (ref 1.0–2.5)
Albumin: 4.4 g/dL (ref 3.6–5.1)
BUN: 19 mg/dL (ref 7–25)
Chloride: 105 mmol/L (ref 98–110)
Globulin: 2.7 g/dL (ref 1.9–3.7)
Potassium: 4.2 mmol/L (ref 3.5–5.3)
Sodium: 139 mmol/L (ref 135–146)
Total Bilirubin: 0.5 mg/dL (ref 0.2–1.2)
Total Protein: 7.1 g/dL (ref 6.1–8.1)

## 2018-05-12 LAB — LIPID PANEL W/REFLEX DIRECT LDL
Cholesterol: 295 mg/dL — ABNORMAL HIGH (ref <200)
HDL: 57 mg/dL (ref >=40)
LDL Cholesterol (Calc): 217 mg/dL — ABNORMAL HIGH
Non-HDL Cholesterol (Calc): 238 mg/dL (calc) — ABNORMAL HIGH (ref <130)
Total CHOL/HDL Ratio: 5.2 (calc) — ABNORMAL HIGH (ref <5.0)
Triglycerides: 89 mg/dL (ref <150)

## 2018-05-12 LAB — VITAMIN B12: Vitamin B-12: 817 pg/mL (ref 200–1100)

## 2018-05-25 ENCOUNTER — Ambulatory Visit: Payer: Medicare HMO | Admitting: Sports Medicine

## 2018-08-11 ENCOUNTER — Ambulatory Visit: Payer: Medicare HMO | Admitting: Sports Medicine

## 2019-03-22 ENCOUNTER — Other Ambulatory Visit: Payer: Self-pay | Admitting: Sports Medicine

## 2019-03-22 DIAGNOSIS — E7800 Pure hypercholesterolemia, unspecified: Secondary | ICD-10-CM

## 2019-07-05 DIAGNOSIS — H33321 Round hole, right eye: Secondary | ICD-10-CM | POA: Diagnosis not present

## 2019-07-05 DIAGNOSIS — H59811 Chorioretinal scars after surgery for detachment, right eye: Secondary | ICD-10-CM | POA: Diagnosis not present

## 2019-07-21 ENCOUNTER — Ambulatory Visit: Payer: Medicare HMO | Admitting: Sports Medicine

## 2019-07-22 ENCOUNTER — Other Ambulatory Visit: Payer: Self-pay

## 2019-07-22 ENCOUNTER — Ambulatory Visit (INDEPENDENT_AMBULATORY_CARE_PROVIDER_SITE_OTHER): Payer: Medicare HMO | Admitting: Sports Medicine

## 2019-07-22 DIAGNOSIS — E538 Deficiency of other specified B group vitamins: Secondary | ICD-10-CM | POA: Diagnosis not present

## 2019-07-22 DIAGNOSIS — M170 Bilateral primary osteoarthritis of knee: Secondary | ICD-10-CM | POA: Diagnosis not present

## 2019-07-22 DIAGNOSIS — Z0000 Encounter for general adult medical examination without abnormal findings: Secondary | ICD-10-CM

## 2019-07-22 DIAGNOSIS — R7989 Other specified abnormal findings of blood chemistry: Secondary | ICD-10-CM

## 2019-07-22 MED ORDER — CYANOCOBALAMIN 1000 MCG/ML IJ SOLN: 1000.0000 ug | INTRAMUSCULAR | 3 refills | Status: DC

## 2019-07-22 MED ORDER — SHINGRIX 50 MCG/0.5ML IM SUSR: INTRAMUSCULAR | 0 refills | Status: DC

## 2019-07-22 NOTE — Assessment & Plan Note (Signed)
I am going to refill his vitamin B12 to do his own injections at home.

## 2019-07-22 NOTE — Assessment & Plan Note (Signed)
Robert Guerrero is due for a Medicare physical, he will make an appointment for this. He does plan to come fasting so we can check his labs at that time.

## 2019-07-22 NOTE — Assessment & Plan Note (Signed)
This pleasant 69 year old male returns, he has bilateral knee osteoarthritis, we did a Euflexxa series several years ago in his right knee, the right knee is only starting to hurt, the left knee is hurting significantly. We did an aspiration and injection of the left knee, steroid injection on the right knee, he will return as needed for this. Arthrocentesis fluid was slightly cloudy so we are doing a crystal analysis with cultures.

## 2019-07-22 NOTE — Progress Notes (Signed)
    Procedures performed today:    Procedure: Real-time Ultrasound Guided  aspiration/injection of left knee Device: Samsung HS60  Verbal informed consent obtained.  Time-out conducted.  Noted no overlying erythema, induration, or other signs of local infection.  Skin prepped in a sterile fashion.  Local anesthesia: Topical Ethyl chloride.  With sterile technique and under real time ultrasound guidance:  Using an 18-gauge needle aspirated 45 mL of slightly cloudy, orange fluid, syringe switched and 1 cc Kenalog 40, 2 cc lidocaine, 2 cc bupivacaine injected easily Completed without difficulty  Pain immediately resolved suggesting accurate placement of the medication.  Advised to call if fevers/chills, erythema, induration, drainage, or persistent bleeding.  Images permanently stored and available for review in the ultrasound unit.  Impression: Technically successful ultrasound guided injection.  Procedure: Real-time Ultrasound Guided injection of the right knee Device: Samsung HS60  Verbal informed consent obtained.  Time-out conducted.  Noted no overlying erythema, induration, or other signs of local infection.  Skin prepped in a sterile fashion.  Local anesthesia: Topical Ethyl chloride.  With sterile technique and under real time ultrasound guidance: 1 cc Kenalog 40, 2 cc lidocaine, 2 cc bupivacaine injected easily Completed without difficulty  Pain immediately resolved suggesting accurate placement of the medication.  Advised to call if fevers/chills, erythema, induration, drainage, or persistent bleeding.  Images permanently stored and available for review in the ultrasound unit.  Impression: Technically successful ultrasound guided injection.  Independent interpretation of notes and tests performed by another provider:   None.  Brief History, Exam, Impression, and Recommendations:    Primary osteoarthritis of both knees This pleasant 69 year old male returns, he has  bilateral knee osteoarthritis, we did a Euflexxa series several years ago in his right knee, the right knee is only starting to hurt, the left knee is hurting significantly. We did an aspiration and injection of the left knee, steroid injection on the right knee, he will return as needed for this. Arthrocentesis fluid was slightly cloudy so we are doing a crystal analysis with cultures.  Annual physical exam An is due for a Medicare physical, he will make an appointment for this. He does plan to come fasting so we can check his labs at that time.  Low vitamin B12 level I am going to refill his vitamin B12 to do his own injections at home.    ___________________________________________ Gwen Her. Dianah Field, M.D., ABFM., CAQSM. Primary Care and [employer] Instructor of [place] [facility]

## 2019-07-23 LAB — CELL COUNT AND DIFF, FLUID, OTHER
Basophils, %: 0 %
Eosinophils, %: 0 %
Lymphocytes, %: 36 %
Mesothelial, %: 0 %
Monocyte/Macrophage %: 34 %
Neutrophils, %: 30 %
Total Nucleated Cell Ct: 149 cells/uL

## 2019-07-23 LAB — SYNOVIAL FLUID, CRYSTAL

## 2019-08-02 ENCOUNTER — Telehealth: Payer: Self-pay | Admitting: Sports Medicine

## 2019-08-02 ENCOUNTER — Ambulatory Visit (INDEPENDENT_AMBULATORY_CARE_PROVIDER_SITE_OTHER): Payer: Medicare HMO | Admitting: Sports Medicine

## 2019-08-02 ENCOUNTER — Encounter: Payer: Self-pay | Admitting: Sports Medicine

## 2019-08-02 VITALS — BP 190/75 | HR 56 | Ht 71.0 in | Wt 259.0 lb

## 2019-08-02 DIAGNOSIS — E7800 Pure hypercholesterolemia, unspecified: Secondary | ICD-10-CM | POA: Diagnosis not present

## 2019-08-02 DIAGNOSIS — Z0000 Encounter for general adult medical examination without abnormal findings: Secondary | ICD-10-CM | POA: Diagnosis not present

## 2019-08-02 DIAGNOSIS — M170 Bilateral primary osteoarthritis of knee: Secondary | ICD-10-CM | POA: Diagnosis not present

## 2019-08-02 DIAGNOSIS — R739 Hyperglycemia, unspecified: Secondary | ICD-10-CM | POA: Diagnosis not present

## 2019-08-02 DIAGNOSIS — I10 Essential (primary) hypertension: Secondary | ICD-10-CM | POA: Diagnosis not present

## 2019-08-02 MED ORDER — AMLODIPINE BESYLATE 5 MG PO TABS: 5.0000 mg | ORAL_TABLET | Freq: Every day | ORAL | 3 refills | Status: DC

## 2019-08-02 NOTE — Assessment & Plan Note (Signed)
Medicare physical as above. He is due for Cologuard testing.

## 2019-08-02 NOTE — Telephone Encounter (Signed)
Please work on Haematologist for [employer], in the past he has responded well to Euflexxa.  Both knees.  Thank you.

## 2019-08-02 NOTE — Assessment & Plan Note (Addendum)
Lipids continue to be highly elevated. He has failed atorvastatin, lovastatin and rosuvastatin, for this reason we are going to try Repatha. He will probably need a nurse visit to learn how to do the injections.

## 2019-08-02 NOTE — Assessment & Plan Note (Signed)
Uncontrolled, patient has been noncompliant with medications. We are going to restart his blood pressure medicine slowly, he did tell me that he sometimes feels tired with blood pressure medicines and does have some erectile dysfunction. I explained that if this happens he needs to let me know and we need to adjust dose or try different medications. Starting amlodipine 5 mg daily, return in 2 weeks for a nurse visit blood pressure check and dose titration. He understands that it was likely the hydrochlorothiazide component that resulted in his erectile dysfunction.

## 2019-08-02 NOTE — Assessment & Plan Note (Addendum)
Knees are still hurting in spite of injection recently, left knee was injected last week. He did tell the Euflexxa worked well in the past, I am going to work on getting him approved for this again. He is not interested in any surgical intervention at all. He has tried oral medications such as several NSAIDs, arthritis strength Tylenol, oxycodone, none of which were effective.

## 2019-08-02 NOTE — Progress Notes (Addendum)
Subjective:   Robert Guerrero is a 69 y.o. male who presents for Medicare Annual/Subsequent preventive examination.  Review of Systems:  Comprehensive review of systems is negative     Objective:    Vitals: BP (!) 190/75   Pulse (!) 56   Ht 5\' 11"  (1.803 m)   Wt 259 lb (117.5 kg)   BMI 36.12 kg/m   Body mass index is 36.12 kg/m.  Advanced Directives 05/30/2016  Does Patient Have a Medical Advance Directive? No  Would patient like information on creating a medical advance directive? No - Patient declined    Tobacco Social History   Tobacco Use  Smoking Status Never Smoker  Smokeless Tobacco Never Used     Counseling given: Not Answered   Past Medical History:  Diagnosis Date  . Hyperlipidemia   . Hypertension   . Osteoarthritis of right knee   . Right lumbar radiculitis   . SVT (supraventricular tachycardia) (HCC)    No past surgical history on file. Family History  Problem Relation Age of Onset  . CAD Father        MI at age 32-60   Social History   Socioeconomic History  . Marital status: Married    Spouse name: Not on file  . Number of children: 2  . Years of education: Not on file  . Highest education level: Not on file  Occupational History    Employer: [employer]    Comment: Regulatory affairs officer  Tobacco Use  . Smoking status: Never Smoker  . Smokeless tobacco: Never Used  Substance and Sexual Activity  . Alcohol use: Yes    Comment: Occasional  . Drug use: Not on file  . Sexual activity: Not on file  Other Topics Concern  . Not on file  Social History Narrative  . Not on file   Social Determinants of Health   Financial Resource Strain:   . Difficulty of Paying Living Expenses:   Food Insecurity:   . Worried About Charity fundraiser in the Last Year:   . Arboriculturist in the Last Year:   Transportation Needs:   . Film/video editor (Medical):   Marland Kitchen Lack of Transportation (Non-Medical):   Physical Activity:   .  Days of Exercise per Week:   . Minutes of Exercise per Session:   Stress:   . Feeling of Stress :   Social Connections:   . Frequency of Communication with Friends and Family:   . Frequency of Social Gatherings with Friends and Family:   . Attends Religious Services:   . Active Member of Clubs or Organizations:   . Attends Archivist Meetings:   Marland Kitchen Marital Status:     Outpatient Encounter Medications as of 08/02/2019  Medication Sig  . amLODipine (NORVASC) 5 MG tablet Take 1 tablet (5 mg total) by mouth daily.  . celecoxib (CELEBREX) 200 MG capsule One to 2 tablets by mouth daily as needed for pain.  . cyanocobalamin (,VITAMIN B-12,) 1000 MCG/ML injection Inject 1 mL (1,000 mcg total) into the muscle every 30 (thirty) days.  . Evolocumab (REPATHA SURECLICK) [id number] MG/ML SOAJ Inject 140 mg into the skin every 14 (fourteen) days.  Marland Kitchen omega-3 acid ethyl esters (LOVAZA) 1 g capsule TAKE TWO CAPSULES BY MOUTH TWICE A DAY  . Zoster Vaccine Adjuvanted [facility]) injection Administer at 0 and 2-6 months for 2 total doses.  To be administered by pharmacy.  . [DISCONTINUED] amLODipine (  NORVASC) 10 MG tablet Take 1 tablet (10 mg total) by mouth daily.  . [DISCONTINUED] valsartan-hydrochlorothiazide (DIOVAN-HCT) 320-12.5 MG tablet Take 1 tablet by mouth daily.   No facility-administered encounter medications on file as of 08/02/2019.    Activities of Daily Living No flowsheet data found.  Patient Care Team: Silverio Decamp, MD as PCP - General (Sports Medicine) Silverio Decamp, MD as Consulting Physician (Sports Medicine)   Assessment:   This is a routine wellness examination for [place].  Exercise Activities and Dietary recommendations    Goals   None     Fall Risk Fall Risk  05/05/2017  Falls in the past year? No   Is the patient's home free of loose throw rugs in walkways, pet beds, electrical cords, etc?   yes      Grab bars in the bathroom? yes      Handrails on  the stairs?   yes      Adequate lighting?   yes  Timed Get Up and Go Performed: Not needed  Depression Screen PHQ 2/9 Scores 08/02/2019 05/11/2018 05/05/2017 07/02/2016  PHQ - 2 Score 1 2 0 4  PHQ- 9 Score 4 6 2 14     Cognitive Function        Immunization History  Administered Date(s) Administered  . Influenza, High Dose Seasonal PF 05/05/2017, 05/11/2018  . Influenza,inj,Quad PF,6+ Mos 05/16/2016  . Pneumococcal Conjugate-13 05/16/2016  . Pneumococcal Polysaccharide-23 08/20/2017  . Tdap 05/16/2016    Qualifies for Shingles Vaccine? We will revisit at followup visit  Screening Tests Health Maintenance  Topic Date Due  . COVID-19 Vaccine (1) Never done  . Fecal DNA (Cologuard)  04/02/2019  . INFLUENZA VACCINE  10/31/2019  . TETANUS/TDAP  05/16/2026  . Hepatitis C Screening  Completed  . PNA vac Low Risk Adult  Completed   Cancer Screenings: Lung: Low Dose CT Chest recommended if Age 95-80 years, 30 pack-year currently smoking OR have quit w/in 15years. Patient does not qualify. Colorectal: Ordered today      Plan:   Annual physical exam Medicare physical as above. He is due for Cologuard testing.  Essential hypertension Uncontrolled, patient has been noncompliant with medications. We are going to restart his blood pressure medicine slowly, he did tell me that he sometimes feels tired with blood pressure medicines and does have some erectile dysfunction. I explained that if this happens he needs to let me know and we need to adjust dose or try different medications. Starting amlodipine 5 mg daily, return in 2 weeks for a nurse visit blood pressure check and dose titration. He understands that it was likely the hydrochlorothiazide component that resulted in his erectile dysfunction.  Hyperlipidemia Lipids continue to be highly elevated. He has failed atorvastatin, lovastatin and rosuvastatin, for this reason we are going to try Repatha. He will probably need a nurse  visit to learn how to do the injections.   Primary osteoarthritis of both knees Knees are still hurting in spite of injection recently, left knee was injected last week. He did tell the Euflexxa worked well in the past, I am going to work on getting him approved for this again. He is not interested in any surgical intervention at all. He has tried oral medications such as several NSAIDs, arthritis strength Tylenol, oxycodone, none of which were effective.   I have personally reviewed and noted the following in the patient's chart:   . Medical and social history . Use of alcohol, tobacco or  illicit drugs  . Current medications and supplements . Functional ability and status . Nutritional status . Physical activity . Advanced directives . List of other physicians . Hospitalizations, surgeries, and ER visits in previous 12 months . Vitals . Screenings to include cognitive, depression, and falls . Referrals and appointments  In addition, I have reviewed and discussed with patient certain preventive protocols, quality metrics, and best practice recommendations. A written personalized care plan for preventive services as well as general preventive health recommendations were provided to patient.     Aundria Mems, MD  08/05/2019

## 2019-08-03 LAB — COMPLETE METABOLIC PANEL WITH GFR
AG Ratio: 1.7 (calc) (ref 1.0–2.5)
ALT: 36 U/L (ref 9–46)
AST: 26 U/L (ref 10–35)
Albumin: 4 g/dL (ref 3.6–5.1)
Alkaline phosphatase (APISO): 73 U/L (ref 35–144)
BUN: 20 mg/dL (ref 7–25)
CO2: 24 mmol/L (ref 20–32)
Calcium: 8.8 mg/dL (ref 8.6–10.3)
Chloride: 106 mmol/L (ref 98–110)
Creat: 1.02 mg/dL (ref 0.70–1.25)
GFR, Est African American: 87 mL/min/{1.73_m2} (ref >=60)
GFR, Est Non African American: 75 mL/min/{1.73_m2} (ref >=60)
Globulin: 2.4 g/dL (calc) (ref 1.9–3.7)
Glucose, Bld: 102 mg/dL — ABNORMAL HIGH (ref 65–99)
Potassium: 4.2 mmol/L (ref 3.5–5.3)
Sodium: 138 mmol/L (ref 135–146)
Total Bilirubin: 0.6 mg/dL (ref 0.2–1.2)
Total Protein: 6.4 g/dL (ref 6.1–8.1)

## 2019-08-03 LAB — HEMOGLOBIN A1C
Hgb A1c MFr Bld: 5.9 % of total Hgb — ABNORMAL HIGH (ref <5.7)
Mean Plasma Glucose: 123 (calc)
eAG (mmol/L): 6.8 (calc)

## 2019-08-03 LAB — CBC
HCT: 48.6 % (ref 38.5–50.0)
Hemoglobin: 16.5 g/dL (ref 13.2–17.1)
MCH: 30.6 pg (ref 27.0–33.0)
MCHC: 34 g/dL (ref 32.0–36.0)
MCV: 90.2 fL (ref 80.0–100.0)
MPV: 11.1 fL (ref 7.5–12.5)
Platelets: 206 10*3/uL (ref 140–400)
RBC: 5.39 10*6/uL (ref 4.20–5.80)
RDW: 14.1 % (ref 11.0–15.0)
WBC: 6.6 10*3/uL (ref 3.8–10.8)

## 2019-08-03 LAB — TSH: TSH: 2.21 mIU/L (ref 0.40–4.50)

## 2019-08-03 LAB — LIPID PANEL W/REFLEX DIRECT LDL
Cholesterol: 263 mg/dL — ABNORMAL HIGH (ref <200)
HDL: 55 mg/dL (ref >=40)
LDL Cholesterol (Calc): 188 mg/dL (calc) — ABNORMAL HIGH
Non-HDL Cholesterol (Calc): 208 mg/dL (calc) — ABNORMAL HIGH (ref <130)
Total CHOL/HDL Ratio: 4.8 (calc) (ref <5.0)
Triglycerides: 86 mg/dL (ref <150)

## 2019-08-05 MED ORDER — REPATHA SURECLICK 140 MG/ML ~~LOC~~ SOAJ: 140.0000 mg | SUBCUTANEOUS | 11 refills | Status: DC

## 2019-08-05 NOTE — Addendum Note (Signed)
Addended by: Silverio Decamp on: 08/05/2019 11:59 AM   Modules accepted: Orders

## 2019-08-10 NOTE — Telephone Encounter (Signed)
Received benefits Investigation Detail and Orthovisc required a PA sent through [facility] and received Authorization. We will get patient scheduled for bilateral injections.   Authorization: [id number] Valid 08/10/2019 - 09/10/2019 - CF

## 2019-08-16 ENCOUNTER — Ambulatory Visit: Payer: Medicare HMO

## 2019-08-20 LAB — ANAEROBIC AND AEROBIC CULTURE
AER RESULT:: NO GROWTH
GRAM STAIN:: NONE SEEN
MICRO NUMBER:: 10395634
MICRO NUMBER:: 10395635
SPECIMEN QUALITY:: ADEQUATE
SPECIMEN QUALITY:: ADEQUATE

## 2019-08-23 DIAGNOSIS — Z1211 Encounter for screening for malignant neoplasm of colon: Secondary | ICD-10-CM | POA: Diagnosis not present

## 2019-08-23 NOTE — Telephone Encounter (Signed)
Lets get him scheduled at my next available opening

## 2019-08-26 ENCOUNTER — Encounter: Payer: Self-pay | Admitting: Sports Medicine

## 2019-08-26 ENCOUNTER — Ambulatory Visit (INDEPENDENT_AMBULATORY_CARE_PROVIDER_SITE_OTHER): Payer: Medicare HMO | Admitting: Sports Medicine

## 2019-08-26 VITALS — BP 157/65 | HR 70

## 2019-08-26 DIAGNOSIS — I10 Essential (primary) hypertension: Secondary | ICD-10-CM | POA: Diagnosis not present

## 2019-08-26 DIAGNOSIS — M170 Bilateral primary osteoarthritis of knee: Secondary | ICD-10-CM | POA: Diagnosis not present

## 2019-08-26 LAB — COLOGUARD: Cologuard: NEGATIVE

## 2019-08-26 NOTE — Progress Notes (Signed)
    Procedures performed today:    Procedure: Real-time Ultrasound Guided injection of the left knee Device: Samsung HS60  Verbal informed consent obtained.  Time-out conducted.  Noted no overlying erythema, induration, or other signs of local infection.  Skin prepped in a sterile fashion.  Local anesthesia: Topical Ethyl chloride.  With sterile technique and under real time ultrasound guidance:  30 mg/2 mL of OrthoVisc (sodium hyaluronate) in a prefilled syringe was injected easily into the knee through a 22-gauge needle.  Completed without difficulty  Pain immediately resolved suggesting accurate placement of the medication.  Advised to call if fevers/chills, erythema, induration, drainage, or persistent bleeding.  Images permanently stored and available for review in the ultrasound unit.  Impression: Technically successful ultrasound guided injection.  Procedure: Real-time Ultrasound Guided injection of the right knee Device: Samsung HS60  Verbal informed consent obtained.  Time-out conducted.  Noted no overlying erythema, induration, or other signs of local infection.  Skin prepped in a sterile fashion.  Local anesthesia: Topical Ethyl chloride.  With sterile technique and under real time ultrasound guidance:  30 mg/2 mL of OrthoVisc (sodium hyaluronate) in a prefilled syringe was injected easily into the knee through a 22-gauge needle.  Completed without difficulty  Pain immediately resolved suggesting accurate placement of the medication.  Advised to call if fevers/chills, erythema, induration, drainage, or persistent bleeding.  Images permanently stored and available for review in the ultrasound unit.  Impression: Technically successful ultrasound guided injection.  Independent interpretation of notes and tests performed by another provider:   None.  Brief History, Exam, Impression, and Recommendations:    Primary osteoarthritis of both knees Orthovisc No. 1 of 4 into both  knees. Return in 1 week for #2 of 4, he purchased these syringes, do not bill for Orthovisc.  Essential hypertension Improved considerably from the last visit, it was [id number] systolic, now [id number] systolic and he has having significant pain in his knees. Continue amlodipine 5 mg daily, he is endorsing occasional orthostasis when he gets dehydrated, he will hydrate himself aggressively, and I think he just needs to get used to having a blood pressure that is better. We will check his blood pressure at the next visit and titrate accordingly.    ___________________________________________ Gwen Her. Dianah Field, M.D., ABFM., CAQSM. Primary Care and [employer] Instructor of [place] [facility] of [facility]

## 2019-08-26 NOTE — Assessment & Plan Note (Signed)
Improved considerably from the last visit, it was [id number] systolic, now [id number] systolic and he has having significant pain in his knees. Continue amlodipine 5 mg daily, he is endorsing occasional orthostasis when he gets dehydrated, he will hydrate himself aggressively, and I think he just needs to get used to having a blood pressure that is better. We will check his blood pressure at the next visit and titrate accordingly.

## 2019-08-26 NOTE — Assessment & Plan Note (Signed)
Orthovisc No. 1 of 4 into both knees. Return in 1 week for #2 of 4, he purchased these syringes, do not bill for Orthovisc.

## 2019-09-06 ENCOUNTER — Ambulatory Visit (INDEPENDENT_AMBULATORY_CARE_PROVIDER_SITE_OTHER): Payer: Medicare HMO | Admitting: Sports Medicine

## 2019-09-06 ENCOUNTER — Other Ambulatory Visit: Payer: Self-pay

## 2019-09-06 DIAGNOSIS — I10 Essential (primary) hypertension: Secondary | ICD-10-CM | POA: Diagnosis not present

## 2019-09-06 DIAGNOSIS — M2550 Pain in unspecified joint: Secondary | ICD-10-CM | POA: Diagnosis not present

## 2019-09-06 DIAGNOSIS — M170 Bilateral primary osteoarthritis of knee: Secondary | ICD-10-CM | POA: Diagnosis not present

## 2019-09-06 MED ORDER — PREGABALIN 75 MG PO CAPS: 75.0000 mg | ORAL_CAPSULE | Freq: Every day | ORAL | 3 refills | Status: DC

## 2019-09-06 NOTE — Assessment & Plan Note (Signed)
Adding 75 mg of Lyrica at night, we will do a slow up taper as needed.

## 2019-09-06 NOTE — Assessment & Plan Note (Signed)
Blood pressure continues to be elevated, he tells [place] he take his medications at night, I think am going to have him split his dose and do a half pill twice a day.

## 2019-09-06 NOTE — Assessment & Plan Note (Signed)
Orthovisc No. 2 of 4 into both knees, return in 1 week for #3 of 4.

## 2019-09-06 NOTE — Progress Notes (Signed)
° ° °  Procedures performed today:    Procedure: Real-time Ultrasound Guided injection of the left knee Device: Samsung HS60  Verbal informed consent obtained.  Time-out conducted.  Noted no overlying erythema, induration, or other signs of local infection.  Skin prepped in a sterile fashion.  Local anesthesia: Topical Ethyl chloride.  With sterile technique and under real time ultrasound guidance: 30 mg/2 mL of OrthoVisc (sodium hyaluronate) in a prefilled syringe was injected easily into the knee through a 22-gauge needle.   Completed without difficulty  Pain immediately resolved suggesting accurate placement of the medication.  Advised to call if fevers/chills, erythema, induration, drainage, or persistent bleeding.  Images permanently stored and available for review in the ultrasound unit.  Impression: Technically successful ultrasound guided injection.  Procedure: Real-time Ultrasound Guided injection of the right knee Device: Samsung HS60  Verbal informed consent obtained.  Time-out conducted.  Noted no overlying erythema, induration, or other signs of local infection.  Skin prepped in a sterile fashion.  Local anesthesia: Topical Ethyl chloride.  With sterile technique and under real time ultrasound guidance:  30 mg/2 mL of OrthoVisc (sodium hyaluronate) in a prefilled syringe was injected easily into the knee through a 22-gauge needle.  Completed without difficulty  Pain immediately resolved suggesting accurate placement of the medication.  Advised to call if fevers/chills, erythema, induration, drainage, or persistent bleeding.  Images permanently stored and available for review in the ultrasound unit.  Impression: Technically successful ultrasound guided injection.  Independent interpretation of notes and tests performed by another provider:   None.  Brief History, Exam, Impression, and Recommendations:    Primary osteoarthritis of both knees Orthovisc No. 2 of 4 into  both knees, return in 1 week for #3 of 4.  Essential hypertension Blood pressure continues to be elevated, he tells [place] he take his medications at night, I think am going to have him split his dose and do a half pill twice a day.  Polyarthralgia Adding 75 mg of Lyrica at night, we will do a slow up taper as needed.    ___________________________________________ Gwen Her. Dianah Field, M.D., ABFM., CAQSM. Primary Care and [place] Instructor of [place] of [facility] of Medicine

## 2019-09-13 ENCOUNTER — Encounter: Payer: Self-pay | Admitting: Sports Medicine

## 2019-09-13 ENCOUNTER — Other Ambulatory Visit: Payer: Self-pay

## 2019-09-13 ENCOUNTER — Ambulatory Visit (INDEPENDENT_AMBULATORY_CARE_PROVIDER_SITE_OTHER): Payer: Medicare HMO | Admitting: Sports Medicine

## 2019-09-13 DIAGNOSIS — I10 Essential (primary) hypertension: Secondary | ICD-10-CM

## 2019-09-13 DIAGNOSIS — M2550 Pain in unspecified joint: Secondary | ICD-10-CM | POA: Diagnosis not present

## 2019-09-13 DIAGNOSIS — M170 Bilateral primary osteoarthritis of knee: Secondary | ICD-10-CM | POA: Diagnosis not present

## 2019-09-13 MED ORDER — AMLODIPINE BESYLATE 2.5 MG PO TABS: 1.2500 mg | ORAL_TABLET | Freq: Two times a day (BID) | ORAL | 3 refills | Status: DC

## 2019-09-13 NOTE — Progress Notes (Signed)
    Procedures performed today:    Procedure: Real-time Ultrasound Guidedinjection of the left knee Device: Samsung HS60  Verbal informed consent obtained.  Time-out conducted.  Noted no overlying erythema, induration, or other signs of local infection.  Skin prepped in a sterile fashion.  Local anesthesia: Topical Ethyl chloride.  With sterile technique and under real time ultrasound guidance:30 mg/2 mL of OrthoVisc (sodium hyaluronate) in a prefilled syringe was injected easily into the knee through a 22-gauge needle.   Completed without difficulty  Pain immediately resolved suggesting accurate placement of the medication.  Advised to call if fevers/chills, erythema, induration, drainage, or persistent bleeding.  Images permanently stored and available for review in the ultrasound unit.  Impression: Technically successful ultrasound guided injection.  Procedure: Real-time Ultrasound Guidedinjection of the right knee Device: Samsung HS60  Verbal informed consent obtained.  Time-out conducted.  Noted no overlying erythema, induration, or other signs of local infection.  Skin prepped in a sterile fashion.  Local anesthesia: Topical Ethyl chloride.  With sterile technique and under real time ultrasound guidance: 30 mg/2 mL of OrthoVisc (sodium hyaluronate) in a prefilled syringe was injected easily into the knee through a 22-gauge needle.  Completed without difficulty  Pain immediately resolved suggesting accurate placement of the medication.  Advised to call if fevers/chills, erythema, induration, drainage, or persistent bleeding.  Images permanently stored and available for review in the ultrasound unit.  Impression: Technically successful ultrasound guided injection.  Independent interpretation of notes and tests performed by another provider:   None.  Brief History, Exam, Impression, and Recommendations:    Essential hypertension Blood pressure looks good today, he is  currently taking one half of an amlodipine 5 mg tablet twice daily. He does still complain of orthostasis and weakness. Because of this we are going to drop him to a 2.5 mg tablet and have him do one half tab twice daily. I really think his body just needs to get used to this, per his report he has had a clear stress test and cardiology evaluation in the past.  Polyarthralgia Increasing Lyrica to 150 mg at night.  Primary osteoarthritis of both knees Orthovisc No. 3 of 4 into both knees, return in 1 week for #4 of 4, do not bill for Orthovisc    ___________________________________________ Gwen Her. Dianah Field, M.D., ABFM., CAQSM. Primary Care and [place] Instructor of [place] of [facility] of Medicine

## 2019-09-13 NOTE — Assessment & Plan Note (Signed)
Orthovisc No. 3 of 4 into both knees, return in 1 week for #4 of 4, do not bill for Orthovisc

## 2019-09-13 NOTE — Assessment & Plan Note (Signed)
Increasing Lyrica to 150 mg at night.

## 2019-09-13 NOTE — Assessment & Plan Note (Signed)
Blood pressure looks good today, he is currently taking one half of an amlodipine 5 mg tablet twice daily. He does still complain of orthostasis and weakness. Because of this we are going to drop him to a 2.5 mg tablet and have him do one half tab twice daily. I really think his body just needs to get used to this, per his report he has had a clear stress test and cardiology evaluation in the past.

## 2019-09-20 ENCOUNTER — Ambulatory Visit (INDEPENDENT_AMBULATORY_CARE_PROVIDER_SITE_OTHER): Payer: Medicare HMO | Admitting: Sports Medicine

## 2019-09-20 ENCOUNTER — Encounter: Payer: Self-pay | Admitting: Sports Medicine

## 2019-09-20 ENCOUNTER — Other Ambulatory Visit: Payer: Self-pay

## 2019-09-20 DIAGNOSIS — E7800 Pure hypercholesterolemia, unspecified: Secondary | ICD-10-CM | POA: Diagnosis not present

## 2019-09-20 DIAGNOSIS — M25512 Pain in left shoulder: Secondary | ICD-10-CM

## 2019-09-20 DIAGNOSIS — M25511 Pain in right shoulder: Secondary | ICD-10-CM

## 2019-09-20 DIAGNOSIS — M2550 Pain in unspecified joint: Secondary | ICD-10-CM

## 2019-09-20 DIAGNOSIS — G8929 Other chronic pain: Secondary | ICD-10-CM

## 2019-09-20 DIAGNOSIS — M170 Bilateral primary osteoarthritis of knee: Secondary | ICD-10-CM | POA: Diagnosis not present

## 2019-09-20 NOTE — Assessment & Plan Note (Signed)
Orthovisc No. 4 of 4 into both knees, the knees are starting to feel better. Return in a month for this.

## 2019-09-20 NOTE — Assessment & Plan Note (Signed)
Has had multiple intolerances to statins including atorvastatin, lovastatin, rosuvastatin, we did switch to Repatha which was initially well-tolerated and provided good control of his lipids. Unfortunately he feels as though some of his achiness is coming from the [place], he will discontinue this for a solid month and we can reevaluate. If persistent arthralgias and myalgias we will probably restart the Repatha versus switch to Praluent.

## 2019-09-20 NOTE — Assessment & Plan Note (Signed)
Chronic bilateral shoulder pain, left worse than right, worse with abduction with mechanical symptoms, we have discussed this in further detail at the follow-up visit.

## 2019-09-20 NOTE — Progress Notes (Signed)
    Procedures performed today:    Procedure: Real-time Ultrasound Guided  aspiration/injection of left knee Device: Samsung HS60  Verbal informed consent obtained.  Time-out conducted.  Noted no overlying erythema, induration, or other signs of local infection.  Skin prepped in a sterile fashion.  Local anesthesia: Topical Ethyl chloride.  With sterile technique and under real time ultrasound guidance:  Using 18-gauge needle aspirated 25 cc of clear, straw-colored fluid, syringe switched and 30 mg/2 mL of OrthoVisc (sodium hyaluronate) in a prefilled syringe was injected easily into the knee. Completed without difficulty  Pain immediately resolved suggesting accurate placement of the medication.  Advised to call if fevers/chills, erythema, induration, drainage, or persistent bleeding.  Images permanently stored and available for review in the ultrasound unit.  Impression: Technically successful ultrasound guided injection.  Procedure: Real-time Ultrasound Guided injection of the right knee Device: Samsung HS60  Verbal informed consent obtained.  Time-out conducted.  Noted no overlying erythema, induration, or other signs of local infection.  Skin prepped in a sterile fashion.  Local anesthesia: Topical Ethyl chloride.  With sterile technique and under real time ultrasound guidance:  30 mg/2 mL of OrthoVisc (sodium hyaluronate) in a prefilled syringe was injected easily into the knee through a 22-gauge needle.   Completed without difficulty  Pain immediately resolved suggesting accurate placement of the medication.  Advised to call if fevers/chills, erythema, induration, drainage, or persistent bleeding.  Images permanently stored and available for review in the ultrasound unit.  Impression: Technically successful ultrasound guided injection.  Independent interpretation of notes and tests performed by another provider:   None.  Brief History, Exam, Impression, and Recommendations:     Primary osteoarthritis of both knees Orthovisc No. 4 of 4 into both knees, the knees are starting to feel better. Return in a month for this.  Polyarthralgia Discontinue Lyrica altogether, he feels as though its not helping him and he is just getting groggy. He suspects his arthralgias are due to his Repatha so we will go ahead and stop this.  Hyperlipidemia Has had multiple intolerances to statins including atorvastatin, lovastatin, rosuvastatin, we did switch to Repatha which was initially well-tolerated and provided good control of his lipids. Unfortunately he feels as though some of his achiness is coming from the [place], he will discontinue this for a solid month and we can reevaluate. If persistent arthralgias and myalgias we will probably restart the Repatha versus switch to Praluent.  Bilateral shoulder pain Chronic bilateral shoulder pain, left worse than right, worse with abduction with mechanical symptoms, we have discussed this in further detail at the follow-up visit.    ___________________________________________ Gwen Her. Dianah Field, M.D., ABFM., CAQSM. Primary Care and [place] Instructor of [place] of [facility] of Medicine

## 2019-09-20 NOTE — Assessment & Plan Note (Signed)
Discontinue Lyrica altogether, he feels as though its not helping him and he is just getting groggy. He suspects his arthralgias are due to his Repatha so we will go ahead and stop this.

## 2019-10-18 ENCOUNTER — Ambulatory Visit (INDEPENDENT_AMBULATORY_CARE_PROVIDER_SITE_OTHER): Payer: Medicare HMO | Admitting: Sports Medicine

## 2019-10-18 ENCOUNTER — Other Ambulatory Visit: Payer: Self-pay

## 2019-10-18 ENCOUNTER — Encounter: Payer: Self-pay | Admitting: Sports Medicine

## 2019-10-18 DIAGNOSIS — E538 Deficiency of other specified B group vitamins: Secondary | ICD-10-CM

## 2019-10-18 DIAGNOSIS — M2550 Pain in unspecified joint: Secondary | ICD-10-CM

## 2019-10-18 DIAGNOSIS — M170 Bilateral primary osteoarthritis of knee: Secondary | ICD-10-CM | POA: Diagnosis not present

## 2019-10-18 DIAGNOSIS — E7800 Pure hypercholesterolemia, unspecified: Secondary | ICD-10-CM

## 2019-10-18 DIAGNOSIS — M5416 Radiculopathy, lumbar region: Secondary | ICD-10-CM

## 2019-10-18 DIAGNOSIS — R7989 Other specified abnormal findings of blood chemistry: Secondary | ICD-10-CM

## 2019-10-18 MED ORDER — DULOXETINE HCL 30 MG PO CPEP: 30.0000 mg | ORAL_CAPSULE | Freq: Every day | ORAL | 3 refills | Status: DC

## 2019-10-18 NOTE — Assessment & Plan Note (Addendum)
Robert Guerrero continues to have widespread aches and pains, he complains of pain all over. We had him on Lyrica initially, he felt no improvement in spite of an aggressive up taper. At the last visit he suspected it was from his [place], he stopped the [place] and has noted no improvement in his achiness, so he will restart the [place]. I am going to go ahead and pull the trigger for rheumatoid work-up as I do suspect that there may be polymyalgia rheumatica. I am also when to start low-dose Cymbalta. 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  Update: Rheumatoid testing is negative with the exception of antinuclear antibody which is positive with a very high titer, this can be seen in mixed connective tissue disease and lupus though some of the lupus confirmatory testing is negative, I would like him to touch base with rheumatology, I would also like him to do prednisone 10 mg daily until he sees the rheumatologist.

## 2019-10-18 NOTE — Progress Notes (Addendum)
° ° °  Procedures performed today:    Procedure: Real-time Ultrasound Guided  aspiration of left knee Device: Samsung HS60  Verbal informed consent obtained.  Time-out conducted.  Noted no overlying erythema, induration, or other signs of local infection.  Skin prepped in a sterile fashion.  Local anesthesia: Topical Ethyl chloride.  With sterile technique and under real time ultrasound guidance:  Using an 18-gauge needle aspirated 46 cc of clear, straw-colored fluid.   Completed without difficulty  Pain immediately resolved suggesting accurate placement of the medication.  Advised to call if fevers/chills, erythema, induration, drainage, or persistent bleeding.  Images permanently stored and available for review in the ultrasound unit.  Impression: Technically successful ultrasound guided injection.  Independent interpretation of notes and tests performed by another provider:   None.  Brief History, Exam, Impression, and Recommendations:    Primary osteoarthritis of both knees Right knee is doing okay, left knee is still hurting, he does still have an effusion, we finished Orthovisc a month ago. Arthrocentesis therapeutic on the left, return as needed.  Polyarthralgia Robert Guerrero continues to have widespread aches and pains, he complains of pain all over. We had him on Lyrica initially, he felt no improvement in spite of an aggressive up taper. At the last visit he suspected it was from his [place], he stopped the [place] and has noted no improvement in his achiness, so he will restart the [place]. I am going to go ahead and pull the trigger for rheumatoid work-up as I do suspect that there may be polymyalgia rheumatica. I am also when to start low-dose Cymbalta. 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  Update: Rheumatoid testing is negative with the exception of antinuclear antibody which is positive with a very high titer, this can be seen in mixed connective  tissue disease and lupus though some of the lupus confirmatory testing is negative, I would like him to touch base with rheumatology, I would also like him to do prednisone 10 mg daily until he sees the rheumatologist.  Hyperlipidemia History of multiple intolerances to statins including atorvastatin, lovastatin, rosuvastatin, Repatha did well, we can recheck his lipids in about 2 to 3 months.  Low vitamin B12 level Robert Guerrero also has B12 deficiency, he has not had his needles so has not been able to do the B12 shots. I gave him a handful of blunt and dry needles as well as 25-gauge 1 inch injection needles here, I would like to go ahead and check his B12 levels, as well as methylmalonic acid levels today with his labs.  Right lumbar radiculitis Robert Guerrero does have a history of lumbar spinal stenosis, he tends to walk in a forward flexed posture. He also endorses that the majority of his pain is in his back, hip girdle and legs. If the above work-up is unrevealing we will focus again on his lumbar spine, he did pretty well with his first epidural, facet joint injections and second epidural injection back in 2018 were not tremendously efficacious.    ___________________________________________ Gwen Her. Dianah Field, M.D., ABFM., CAQSM. Primary Care and [place] Instructor of [place] of [facility] of Medicine

## 2019-10-18 NOTE — Assessment & Plan Note (Signed)
Arsalan does have a history of lumbar spinal stenosis, he tends to walk in a forward flexed posture. He also endorses that the majority of his pain is in his back, hip girdle and legs. If the above work-up is unrevealing we will focus again on his lumbar spine, he did pretty well with his first epidural, facet joint injections and second epidural injection back in 2018 were not tremendously efficacious.

## 2019-10-18 NOTE — Assessment & Plan Note (Signed)
History of multiple intolerances to statins including atorvastatin, lovastatin, rosuvastatin, Repatha did well, we can recheck his lipids in about 2 to 3 months.

## 2019-10-18 NOTE — Assessment & Plan Note (Signed)
Robert Guerrero also has B12 deficiency, he has not had his needles so has not been able to do the B12 shots. I gave him a handful of blunt and dry needles as well as 25-gauge 1 inch injection needles here, I would like to go ahead and check his B12 levels, as well as methylmalonic acid levels today with his labs.

## 2019-10-18 NOTE — Assessment & Plan Note (Signed)
Right knee is doing okay, left knee is still hurting, he does still have an effusion, we finished Orthovisc a month ago. Arthrocentesis therapeutic on the left, return as needed.

## 2019-10-21 LAB — LUPUS(12) PANEL
Anti Nuclear Antibody (ANA): POSITIVE — AB
C3 Complement: 144 mg/dL (ref 82–185)
C4 Complement: 33 mg/dL (ref 15–53)
ENA SM Ab Ser-aCnc: <1 AI
Rheumatoid fact SerPl-aCnc: <14 IU/mL (ref <14)
Ribosomal P Protein Ab: <1 AI
SM/RNP: <1 AI
SSA (Ro) (ENA) Antibody, IgG: <1 AI
SSB (La) (ENA) Antibody, IgG: <1 AI
Scleroderma (Scl-70) (ENA) Antibody, IgG: <1 AI
Thyroperoxidase Ab SerPl-aCnc: 3 IU/mL (ref <9)
ds DNA Ab: <1 IU/mL

## 2019-10-21 LAB — CYCLIC CITRUL PEPTIDE ANTIBODY, IGG: Cyclic Citrullin Peptide Ab: <16 UNITS

## 2019-10-21 LAB — CK: Total CK: 192 U/L (ref 44–196)

## 2019-10-21 LAB — ANTI-NUCLEAR AB-TITER (ANA TITER): ANA Titer 1: 1:320 {titer} — ABNORMAL HIGH

## 2019-10-21 LAB — URIC ACID: Uric Acid, Serum: 6 mg/dL (ref 4.0–8.0)

## 2019-10-21 LAB — RHEUMATOID FACTOR (IGA, IGG, IGM)
Rheumatoid Factor (IgA): <5 U (ref <=6)
Rheumatoid Factor (IgG): <5 U (ref <=6)
Rheumatoid Factor (IgM): <5 U (ref <=6)

## 2019-10-21 LAB — C-REACTIVE PROTEIN: CRP: 1.8 mg/L (ref <8.0)

## 2019-10-21 LAB — METHYLMALONIC ACID, SERUM: Methylmalonic Acid, Quant: 107 nmol/L (ref 87–318)

## 2019-10-21 LAB — VITAMIN B12: Vitamin B-12: 308 pg/mL (ref 200–1100)

## 2019-10-21 LAB — SEDIMENTATION RATE: Sed Rate: 2 mm/h (ref 0–20)

## 2019-10-21 NOTE — Addendum Note (Signed)
Addended by: Silverio Decamp on: 10/21/2019 10:08 AM   Modules accepted: Orders

## 2019-10-25 DIAGNOSIS — M50322 Other cervical disc degeneration at C5-C6 level: Secondary | ICD-10-CM | POA: Diagnosis not present

## 2019-10-25 DIAGNOSIS — M9901 Segmental and somatic dysfunction of cervical region: Secondary | ICD-10-CM | POA: Diagnosis not present

## 2019-10-25 DIAGNOSIS — M5136 Other intervertebral disc degeneration, lumbar region: Secondary | ICD-10-CM | POA: Diagnosis not present

## 2019-10-25 DIAGNOSIS — M50323 Other cervical disc degeneration at C6-C7 level: Secondary | ICD-10-CM | POA: Diagnosis not present

## 2019-10-25 DIAGNOSIS — M9904 Segmental and somatic dysfunction of sacral region: Secondary | ICD-10-CM | POA: Diagnosis not present

## 2019-10-25 DIAGNOSIS — M5431 Sciatica, right side: Secondary | ICD-10-CM | POA: Diagnosis not present

## 2019-10-25 DIAGNOSIS — M531 Cervicobrachial syndrome: Secondary | ICD-10-CM | POA: Diagnosis not present

## 2019-10-25 DIAGNOSIS — M9905 Segmental and somatic dysfunction of pelvic region: Secondary | ICD-10-CM | POA: Diagnosis not present

## 2019-10-25 DIAGNOSIS — M9903 Segmental and somatic dysfunction of lumbar region: Secondary | ICD-10-CM | POA: Diagnosis not present

## 2019-10-26 DIAGNOSIS — M5431 Sciatica, right side: Secondary | ICD-10-CM | POA: Diagnosis not present

## 2019-10-26 DIAGNOSIS — M531 Cervicobrachial syndrome: Secondary | ICD-10-CM | POA: Diagnosis not present

## 2019-10-26 DIAGNOSIS — M9904 Segmental and somatic dysfunction of sacral region: Secondary | ICD-10-CM | POA: Diagnosis not present

## 2019-10-26 DIAGNOSIS — M9903 Segmental and somatic dysfunction of lumbar region: Secondary | ICD-10-CM | POA: Diagnosis not present

## 2019-10-26 DIAGNOSIS — M9901 Segmental and somatic dysfunction of cervical region: Secondary | ICD-10-CM | POA: Diagnosis not present

## 2019-10-26 DIAGNOSIS — M50322 Other cervical disc degeneration at C5-C6 level: Secondary | ICD-10-CM | POA: Diagnosis not present

## 2019-10-26 DIAGNOSIS — M50323 Other cervical disc degeneration at C6-C7 level: Secondary | ICD-10-CM | POA: Diagnosis not present

## 2019-10-26 DIAGNOSIS — M5136 Other intervertebral disc degeneration, lumbar region: Secondary | ICD-10-CM | POA: Diagnosis not present

## 2019-10-26 DIAGNOSIS — M9905 Segmental and somatic dysfunction of pelvic region: Secondary | ICD-10-CM | POA: Diagnosis not present

## 2019-10-27 DIAGNOSIS — M5136 Other intervertebral disc degeneration, lumbar region: Secondary | ICD-10-CM | POA: Diagnosis not present

## 2019-10-27 DIAGNOSIS — M9905 Segmental and somatic dysfunction of pelvic region: Secondary | ICD-10-CM | POA: Diagnosis not present

## 2019-10-27 DIAGNOSIS — M9904 Segmental and somatic dysfunction of sacral region: Secondary | ICD-10-CM | POA: Diagnosis not present

## 2019-10-27 DIAGNOSIS — M5431 Sciatica, right side: Secondary | ICD-10-CM | POA: Diagnosis not present

## 2019-10-27 DIAGNOSIS — M531 Cervicobrachial syndrome: Secondary | ICD-10-CM | POA: Diagnosis not present

## 2019-10-27 DIAGNOSIS — M9901 Segmental and somatic dysfunction of cervical region: Secondary | ICD-10-CM | POA: Diagnosis not present

## 2019-10-27 DIAGNOSIS — M50323 Other cervical disc degeneration at C6-C7 level: Secondary | ICD-10-CM | POA: Diagnosis not present

## 2019-10-27 DIAGNOSIS — M9903 Segmental and somatic dysfunction of lumbar region: Secondary | ICD-10-CM | POA: Diagnosis not present

## 2019-10-27 DIAGNOSIS — M50322 Other cervical disc degeneration at C5-C6 level: Secondary | ICD-10-CM | POA: Diagnosis not present

## 2019-10-28 DIAGNOSIS — M5431 Sciatica, right side: Secondary | ICD-10-CM | POA: Diagnosis not present

## 2019-10-28 DIAGNOSIS — M9904 Segmental and somatic dysfunction of sacral region: Secondary | ICD-10-CM | POA: Diagnosis not present

## 2019-10-28 DIAGNOSIS — M5136 Other intervertebral disc degeneration, lumbar region: Secondary | ICD-10-CM | POA: Diagnosis not present

## 2019-10-28 DIAGNOSIS — M9901 Segmental and somatic dysfunction of cervical region: Secondary | ICD-10-CM | POA: Diagnosis not present

## 2019-10-28 DIAGNOSIS — M9905 Segmental and somatic dysfunction of pelvic region: Secondary | ICD-10-CM | POA: Diagnosis not present

## 2019-10-28 DIAGNOSIS — M9903 Segmental and somatic dysfunction of lumbar region: Secondary | ICD-10-CM | POA: Diagnosis not present

## 2019-10-28 DIAGNOSIS — M50323 Other cervical disc degeneration at C6-C7 level: Secondary | ICD-10-CM | POA: Diagnosis not present

## 2019-10-28 DIAGNOSIS — M50322 Other cervical disc degeneration at C5-C6 level: Secondary | ICD-10-CM | POA: Diagnosis not present

## 2019-10-28 DIAGNOSIS — M531 Cervicobrachial syndrome: Secondary | ICD-10-CM | POA: Diagnosis not present

## 2019-11-01 ENCOUNTER — Ambulatory Visit: Payer: Medicare HMO

## 2019-11-01 DIAGNOSIS — M531 Cervicobrachial syndrome: Secondary | ICD-10-CM | POA: Diagnosis not present

## 2019-11-01 DIAGNOSIS — M9905 Segmental and somatic dysfunction of pelvic region: Secondary | ICD-10-CM | POA: Diagnosis not present

## 2019-11-01 DIAGNOSIS — M5136 Other intervertebral disc degeneration, lumbar region: Secondary | ICD-10-CM | POA: Diagnosis not present

## 2019-11-01 DIAGNOSIS — M50323 Other cervical disc degeneration at C6-C7 level: Secondary | ICD-10-CM | POA: Diagnosis not present

## 2019-11-01 DIAGNOSIS — M5431 Sciatica, right side: Secondary | ICD-10-CM | POA: Diagnosis not present

## 2019-11-01 DIAGNOSIS — M9904 Segmental and somatic dysfunction of sacral region: Secondary | ICD-10-CM | POA: Diagnosis not present

## 2019-11-01 DIAGNOSIS — M50322 Other cervical disc degeneration at C5-C6 level: Secondary | ICD-10-CM | POA: Diagnosis not present

## 2019-11-01 DIAGNOSIS — M9903 Segmental and somatic dysfunction of lumbar region: Secondary | ICD-10-CM | POA: Diagnosis not present

## 2019-11-01 DIAGNOSIS — M9901 Segmental and somatic dysfunction of cervical region: Secondary | ICD-10-CM | POA: Diagnosis not present

## 2019-11-02 DIAGNOSIS — M50323 Other cervical disc degeneration at C6-C7 level: Secondary | ICD-10-CM | POA: Diagnosis not present

## 2019-11-02 DIAGNOSIS — M9903 Segmental and somatic dysfunction of lumbar region: Secondary | ICD-10-CM | POA: Diagnosis not present

## 2019-11-02 DIAGNOSIS — M50322 Other cervical disc degeneration at C5-C6 level: Secondary | ICD-10-CM | POA: Diagnosis not present

## 2019-11-02 DIAGNOSIS — M9901 Segmental and somatic dysfunction of cervical region: Secondary | ICD-10-CM | POA: Diagnosis not present

## 2019-11-02 DIAGNOSIS — M9905 Segmental and somatic dysfunction of pelvic region: Secondary | ICD-10-CM | POA: Diagnosis not present

## 2019-11-02 DIAGNOSIS — M9904 Segmental and somatic dysfunction of sacral region: Secondary | ICD-10-CM | POA: Diagnosis not present

## 2019-11-02 DIAGNOSIS — M5431 Sciatica, right side: Secondary | ICD-10-CM | POA: Diagnosis not present

## 2019-11-02 DIAGNOSIS — M5136 Other intervertebral disc degeneration, lumbar region: Secondary | ICD-10-CM | POA: Diagnosis not present

## 2019-11-02 DIAGNOSIS — M531 Cervicobrachial syndrome: Secondary | ICD-10-CM | POA: Diagnosis not present

## 2019-11-04 DIAGNOSIS — M9903 Segmental and somatic dysfunction of lumbar region: Secondary | ICD-10-CM | POA: Diagnosis not present

## 2019-11-04 DIAGNOSIS — M5136 Other intervertebral disc degeneration, lumbar region: Secondary | ICD-10-CM | POA: Diagnosis not present

## 2019-11-04 DIAGNOSIS — M9905 Segmental and somatic dysfunction of pelvic region: Secondary | ICD-10-CM | POA: Diagnosis not present

## 2019-11-04 DIAGNOSIS — M50322 Other cervical disc degeneration at C5-C6 level: Secondary | ICD-10-CM | POA: Diagnosis not present

## 2019-11-04 DIAGNOSIS — M5431 Sciatica, right side: Secondary | ICD-10-CM | POA: Diagnosis not present

## 2019-11-04 DIAGNOSIS — M9901 Segmental and somatic dysfunction of cervical region: Secondary | ICD-10-CM | POA: Diagnosis not present

## 2019-11-04 DIAGNOSIS — M531 Cervicobrachial syndrome: Secondary | ICD-10-CM | POA: Diagnosis not present

## 2019-11-04 DIAGNOSIS — M50323 Other cervical disc degeneration at C6-C7 level: Secondary | ICD-10-CM | POA: Diagnosis not present

## 2019-11-04 DIAGNOSIS — M9904 Segmental and somatic dysfunction of sacral region: Secondary | ICD-10-CM | POA: Diagnosis not present

## 2019-11-08 DIAGNOSIS — M9901 Segmental and somatic dysfunction of cervical region: Secondary | ICD-10-CM | POA: Diagnosis not present

## 2019-11-08 DIAGNOSIS — M5136 Other intervertebral disc degeneration, lumbar region: Secondary | ICD-10-CM | POA: Diagnosis not present

## 2019-11-08 DIAGNOSIS — M50323 Other cervical disc degeneration at C6-C7 level: Secondary | ICD-10-CM | POA: Diagnosis not present

## 2019-11-08 DIAGNOSIS — M531 Cervicobrachial syndrome: Secondary | ICD-10-CM | POA: Diagnosis not present

## 2019-11-08 DIAGNOSIS — M9903 Segmental and somatic dysfunction of lumbar region: Secondary | ICD-10-CM | POA: Diagnosis not present

## 2019-11-08 DIAGNOSIS — M9904 Segmental and somatic dysfunction of sacral region: Secondary | ICD-10-CM | POA: Diagnosis not present

## 2019-11-08 DIAGNOSIS — M5431 Sciatica, right side: Secondary | ICD-10-CM | POA: Diagnosis not present

## 2019-11-08 DIAGNOSIS — M50322 Other cervical disc degeneration at C5-C6 level: Secondary | ICD-10-CM | POA: Diagnosis not present

## 2019-11-08 DIAGNOSIS — M9905 Segmental and somatic dysfunction of pelvic region: Secondary | ICD-10-CM | POA: Diagnosis not present

## 2019-11-09 DIAGNOSIS — M50322 Other cervical disc degeneration at C5-C6 level: Secondary | ICD-10-CM | POA: Diagnosis not present

## 2019-11-09 DIAGNOSIS — M50323 Other cervical disc degeneration at C6-C7 level: Secondary | ICD-10-CM | POA: Diagnosis not present

## 2019-11-09 DIAGNOSIS — M531 Cervicobrachial syndrome: Secondary | ICD-10-CM | POA: Diagnosis not present

## 2019-11-09 DIAGNOSIS — M5136 Other intervertebral disc degeneration, lumbar region: Secondary | ICD-10-CM | POA: Diagnosis not present

## 2019-11-09 DIAGNOSIS — M9905 Segmental and somatic dysfunction of pelvic region: Secondary | ICD-10-CM | POA: Diagnosis not present

## 2019-11-09 DIAGNOSIS — M5431 Sciatica, right side: Secondary | ICD-10-CM | POA: Diagnosis not present

## 2019-11-09 DIAGNOSIS — M9901 Segmental and somatic dysfunction of cervical region: Secondary | ICD-10-CM | POA: Diagnosis not present

## 2019-11-09 DIAGNOSIS — M9903 Segmental and somatic dysfunction of lumbar region: Secondary | ICD-10-CM | POA: Diagnosis not present

## 2019-11-09 DIAGNOSIS — M9904 Segmental and somatic dysfunction of sacral region: Secondary | ICD-10-CM | POA: Diagnosis not present

## 2019-11-11 DIAGNOSIS — M9901 Segmental and somatic dysfunction of cervical region: Secondary | ICD-10-CM | POA: Diagnosis not present

## 2019-11-11 DIAGNOSIS — M9904 Segmental and somatic dysfunction of sacral region: Secondary | ICD-10-CM | POA: Diagnosis not present

## 2019-11-11 DIAGNOSIS — M9905 Segmental and somatic dysfunction of pelvic region: Secondary | ICD-10-CM | POA: Diagnosis not present

## 2019-11-11 DIAGNOSIS — M531 Cervicobrachial syndrome: Secondary | ICD-10-CM | POA: Diagnosis not present

## 2019-11-11 DIAGNOSIS — M9903 Segmental and somatic dysfunction of lumbar region: Secondary | ICD-10-CM | POA: Diagnosis not present

## 2019-11-11 DIAGNOSIS — M50322 Other cervical disc degeneration at C5-C6 level: Secondary | ICD-10-CM | POA: Diagnosis not present

## 2019-11-11 DIAGNOSIS — M5136 Other intervertebral disc degeneration, lumbar region: Secondary | ICD-10-CM | POA: Diagnosis not present

## 2019-11-11 DIAGNOSIS — M50323 Other cervical disc degeneration at C6-C7 level: Secondary | ICD-10-CM | POA: Diagnosis not present

## 2019-11-11 DIAGNOSIS — M5431 Sciatica, right side: Secondary | ICD-10-CM | POA: Diagnosis not present

## 2019-11-15 DIAGNOSIS — M531 Cervicobrachial syndrome: Secondary | ICD-10-CM | POA: Diagnosis not present

## 2019-11-15 DIAGNOSIS — M9901 Segmental and somatic dysfunction of cervical region: Secondary | ICD-10-CM | POA: Diagnosis not present

## 2019-11-15 DIAGNOSIS — M5136 Other intervertebral disc degeneration, lumbar region: Secondary | ICD-10-CM | POA: Diagnosis not present

## 2019-11-15 DIAGNOSIS — M9904 Segmental and somatic dysfunction of sacral region: Secondary | ICD-10-CM | POA: Diagnosis not present

## 2019-11-15 DIAGNOSIS — M50323 Other cervical disc degeneration at C6-C7 level: Secondary | ICD-10-CM | POA: Diagnosis not present

## 2019-11-15 DIAGNOSIS — M9905 Segmental and somatic dysfunction of pelvic region: Secondary | ICD-10-CM | POA: Diagnosis not present

## 2019-11-15 DIAGNOSIS — M9903 Segmental and somatic dysfunction of lumbar region: Secondary | ICD-10-CM | POA: Diagnosis not present

## 2019-11-15 DIAGNOSIS — M50322 Other cervical disc degeneration at C5-C6 level: Secondary | ICD-10-CM | POA: Diagnosis not present

## 2019-11-15 DIAGNOSIS — M5431 Sciatica, right side: Secondary | ICD-10-CM | POA: Diagnosis not present

## 2019-11-16 DIAGNOSIS — M5136 Other intervertebral disc degeneration, lumbar region: Secondary | ICD-10-CM | POA: Diagnosis not present

## 2019-11-16 DIAGNOSIS — M50323 Other cervical disc degeneration at C6-C7 level: Secondary | ICD-10-CM | POA: Diagnosis not present

## 2019-11-16 DIAGNOSIS — M5431 Sciatica, right side: Secondary | ICD-10-CM | POA: Diagnosis not present

## 2019-11-16 DIAGNOSIS — M9901 Segmental and somatic dysfunction of cervical region: Secondary | ICD-10-CM | POA: Diagnosis not present

## 2019-11-16 DIAGNOSIS — M531 Cervicobrachial syndrome: Secondary | ICD-10-CM | POA: Diagnosis not present

## 2019-11-16 DIAGNOSIS — M50322 Other cervical disc degeneration at C5-C6 level: Secondary | ICD-10-CM | POA: Diagnosis not present

## 2019-11-16 DIAGNOSIS — M9905 Segmental and somatic dysfunction of pelvic region: Secondary | ICD-10-CM | POA: Diagnosis not present

## 2019-11-16 DIAGNOSIS — M9903 Segmental and somatic dysfunction of lumbar region: Secondary | ICD-10-CM | POA: Diagnosis not present

## 2019-11-16 DIAGNOSIS — M9904 Segmental and somatic dysfunction of sacral region: Secondary | ICD-10-CM | POA: Diagnosis not present

## 2019-11-18 DIAGNOSIS — M9903 Segmental and somatic dysfunction of lumbar region: Secondary | ICD-10-CM | POA: Diagnosis not present

## 2019-11-18 DIAGNOSIS — M9904 Segmental and somatic dysfunction of sacral region: Secondary | ICD-10-CM | POA: Diagnosis not present

## 2019-11-18 DIAGNOSIS — M9901 Segmental and somatic dysfunction of cervical region: Secondary | ICD-10-CM | POA: Diagnosis not present

## 2019-11-18 DIAGNOSIS — M5431 Sciatica, right side: Secondary | ICD-10-CM | POA: Diagnosis not present

## 2019-11-18 DIAGNOSIS — M531 Cervicobrachial syndrome: Secondary | ICD-10-CM | POA: Diagnosis not present

## 2019-11-18 DIAGNOSIS — M50323 Other cervical disc degeneration at C6-C7 level: Secondary | ICD-10-CM | POA: Diagnosis not present

## 2019-11-18 DIAGNOSIS — M5136 Other intervertebral disc degeneration, lumbar region: Secondary | ICD-10-CM | POA: Diagnosis not present

## 2019-11-18 DIAGNOSIS — M50322 Other cervical disc degeneration at C5-C6 level: Secondary | ICD-10-CM | POA: Diagnosis not present

## 2019-11-18 DIAGNOSIS — M9905 Segmental and somatic dysfunction of pelvic region: Secondary | ICD-10-CM | POA: Diagnosis not present

## 2019-11-22 ENCOUNTER — Encounter: Payer: Self-pay | Admitting: Sports Medicine

## 2019-11-22 ENCOUNTER — Ambulatory Visit (INDEPENDENT_AMBULATORY_CARE_PROVIDER_SITE_OTHER): Payer: Medicare HMO

## 2019-11-22 ENCOUNTER — Ambulatory Visit (INDEPENDENT_AMBULATORY_CARE_PROVIDER_SITE_OTHER): Payer: Medicare HMO | Admitting: Sports Medicine

## 2019-11-22 VITALS — Wt 261.0 lb

## 2019-11-22 DIAGNOSIS — F329 Major depressive disorder, single episode, unspecified: Secondary | ICD-10-CM | POA: Diagnosis not present

## 2019-11-22 DIAGNOSIS — M170 Bilateral primary osteoarthritis of knee: Secondary | ICD-10-CM | POA: Diagnosis not present

## 2019-11-22 DIAGNOSIS — M2550 Pain in unspecified joint: Secondary | ICD-10-CM

## 2019-11-22 DIAGNOSIS — E7800 Pure hypercholesterolemia, unspecified: Secondary | ICD-10-CM | POA: Diagnosis not present

## 2019-11-22 DIAGNOSIS — I10 Essential (primary) hypertension: Secondary | ICD-10-CM | POA: Diagnosis not present

## 2019-11-22 NOTE — Assessment & Plan Note (Signed)
Back on Repatha, rechecking lipids in about a month. Multiple intolerances to statins including atorvastatin, lovastatin, rosuvastatin.

## 2019-11-22 NOTE — Assessment & Plan Note (Signed)
Self discontinued his Cymbalta, no changes.

## 2019-11-22 NOTE — Assessment & Plan Note (Signed)
See previous note, widespread aches and pains, Lyrica not effective, off of all statins, rheumatoid work-up was negative with the exception of an ANA with a 1:320 titer, interestingly confirmatory testing was negative and ESR normal. He is going to touch base with rheumatology.

## 2019-11-22 NOTE — Assessment & Plan Note (Addendum)
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

## 2019-11-22 NOTE — Progress Notes (Signed)
    Procedures performed today:    Procedure: Real-time Ultrasound Guided aspiration of left knee Device: Samsung HS60  Verbal informed consent obtained.  Time-out conducted.  Noted no overlying erythema, induration, or other signs of local infection.  Skin prepped in a sterile fashion.  Local anesthesia: Topical Ethyl chloride.  With sterile technique and under real time ultrasound guidance:  50 mL of clear, straw-colored fluid aspirated through an 18-gauge needle from the suprapatellar recess.   Completed without difficulty  Pain immediately resolved suggesting accurate placement of the medication.  Advised to call if fevers/chills, erythema, induration, drainage, or persistent bleeding.  Images permanently stored and available for review in the ultrasound unit.  Impression: Technically successful ultrasound guided injection.  Independent interpretation of notes and tests performed by another provider:   None.  Brief History, Exam, Impression, and Recommendations:    Essential hypertension Overall under adequate control, sometimes labile, now will hold off on amlodipine on days when he is working as he is up on his feet and tends to get orthostatic. Currently doing one half of a 2.5 mg tablet twice daily. Stress echo was unremarkable back in 2015.  Hyperlipidemia Back on Repatha, rechecking lipids in about a month. Multiple intolerances to statins including atorvastatin, lovastatin, rosuvastatin.  Major depression, chronic Self discontinued his Cymbalta, no changes.  Polyarthralgia See previous note, widespread aches and pains, Lyrica not effective, off of all statins, rheumatoid work-up was negative with the exception of an ANA with a 1:320 titer, interestingly confirmatory testing was negative and ESR normal. He is going to touch base with rheumatology.  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    ___________________________________________ Gwen Her. Dianah Field, M.D., ABFM., CAQSM. Primary Care and [place] Instructor of [place] of [facility] of Medicine

## 2019-11-22 NOTE — Assessment & Plan Note (Signed)
Overall under adequate control, sometimes labile, now will hold off on amlodipine on days when he is working as he is up on his feet and tends to get orthostatic. Currently doing one half of a 2.5 mg tablet twice daily. Stress echo was unremarkable back in 2015.

## 2019-11-23 DIAGNOSIS — M9901 Segmental and somatic dysfunction of cervical region: Secondary | ICD-10-CM | POA: Diagnosis not present

## 2019-11-23 DIAGNOSIS — M9905 Segmental and somatic dysfunction of pelvic region: Secondary | ICD-10-CM | POA: Diagnosis not present

## 2019-11-23 DIAGNOSIS — M50323 Other cervical disc degeneration at C6-C7 level: Secondary | ICD-10-CM | POA: Diagnosis not present

## 2019-11-23 DIAGNOSIS — M531 Cervicobrachial syndrome: Secondary | ICD-10-CM | POA: Diagnosis not present

## 2019-11-23 DIAGNOSIS — M50322 Other cervical disc degeneration at C5-C6 level: Secondary | ICD-10-CM | POA: Diagnosis not present

## 2019-11-23 DIAGNOSIS — M5431 Sciatica, right side: Secondary | ICD-10-CM | POA: Diagnosis not present

## 2019-11-23 DIAGNOSIS — M5136 Other intervertebral disc degeneration, lumbar region: Secondary | ICD-10-CM | POA: Diagnosis not present

## 2019-11-23 DIAGNOSIS — M9904 Segmental and somatic dysfunction of sacral region: Secondary | ICD-10-CM | POA: Diagnosis not present

## 2019-11-23 DIAGNOSIS — M9903 Segmental and somatic dysfunction of lumbar region: Secondary | ICD-10-CM | POA: Diagnosis not present

## 2019-11-25 DIAGNOSIS — M9903 Segmental and somatic dysfunction of lumbar region: Secondary | ICD-10-CM | POA: Diagnosis not present

## 2019-11-25 DIAGNOSIS — M5431 Sciatica, right side: Secondary | ICD-10-CM | POA: Diagnosis not present

## 2019-11-25 DIAGNOSIS — M50322 Other cervical disc degeneration at C5-C6 level: Secondary | ICD-10-CM | POA: Diagnosis not present

## 2019-11-25 DIAGNOSIS — M9904 Segmental and somatic dysfunction of sacral region: Secondary | ICD-10-CM | POA: Diagnosis not present

## 2019-11-25 DIAGNOSIS — M5136 Other intervertebral disc degeneration, lumbar region: Secondary | ICD-10-CM | POA: Diagnosis not present

## 2019-11-25 DIAGNOSIS — M50323 Other cervical disc degeneration at C6-C7 level: Secondary | ICD-10-CM | POA: Diagnosis not present

## 2019-11-25 DIAGNOSIS — M9901 Segmental and somatic dysfunction of cervical region: Secondary | ICD-10-CM | POA: Diagnosis not present

## 2019-11-25 DIAGNOSIS — M9905 Segmental and somatic dysfunction of pelvic region: Secondary | ICD-10-CM | POA: Diagnosis not present

## 2019-11-25 DIAGNOSIS — M531 Cervicobrachial syndrome: Secondary | ICD-10-CM | POA: Diagnosis not present

## 2019-11-29 DIAGNOSIS — M5136 Other intervertebral disc degeneration, lumbar region: Secondary | ICD-10-CM | POA: Diagnosis not present

## 2019-11-29 DIAGNOSIS — M50323 Other cervical disc degeneration at C6-C7 level: Secondary | ICD-10-CM | POA: Diagnosis not present

## 2019-11-29 DIAGNOSIS — M5431 Sciatica, right side: Secondary | ICD-10-CM | POA: Diagnosis not present

## 2019-11-29 DIAGNOSIS — M9903 Segmental and somatic dysfunction of lumbar region: Secondary | ICD-10-CM | POA: Diagnosis not present

## 2019-11-29 DIAGNOSIS — M9905 Segmental and somatic dysfunction of pelvic region: Secondary | ICD-10-CM | POA: Diagnosis not present

## 2019-11-29 DIAGNOSIS — M9904 Segmental and somatic dysfunction of sacral region: Secondary | ICD-10-CM | POA: Diagnosis not present

## 2019-11-29 DIAGNOSIS — M50322 Other cervical disc degeneration at C5-C6 level: Secondary | ICD-10-CM | POA: Diagnosis not present

## 2019-11-29 DIAGNOSIS — M531 Cervicobrachial syndrome: Secondary | ICD-10-CM | POA: Diagnosis not present

## 2019-11-29 DIAGNOSIS — M9901 Segmental and somatic dysfunction of cervical region: Secondary | ICD-10-CM | POA: Diagnosis not present

## 2019-12-02 DIAGNOSIS — M5136 Other intervertebral disc degeneration, lumbar region: Secondary | ICD-10-CM | POA: Diagnosis not present

## 2019-12-02 DIAGNOSIS — M5431 Sciatica, right side: Secondary | ICD-10-CM | POA: Diagnosis not present

## 2019-12-02 DIAGNOSIS — M531 Cervicobrachial syndrome: Secondary | ICD-10-CM | POA: Diagnosis not present

## 2019-12-02 DIAGNOSIS — M9904 Segmental and somatic dysfunction of sacral region: Secondary | ICD-10-CM | POA: Diagnosis not present

## 2019-12-02 DIAGNOSIS — M170 Bilateral primary osteoarthritis of knee: Secondary | ICD-10-CM

## 2019-12-02 DIAGNOSIS — M9901 Segmental and somatic dysfunction of cervical region: Secondary | ICD-10-CM | POA: Diagnosis not present

## 2019-12-02 DIAGNOSIS — M9903 Segmental and somatic dysfunction of lumbar region: Secondary | ICD-10-CM | POA: Diagnosis not present

## 2019-12-02 DIAGNOSIS — M50323 Other cervical disc degeneration at C6-C7 level: Secondary | ICD-10-CM | POA: Diagnosis not present

## 2019-12-02 DIAGNOSIS — M9905 Segmental and somatic dysfunction of pelvic region: Secondary | ICD-10-CM | POA: Diagnosis not present

## 2019-12-02 DIAGNOSIS — M50322 Other cervical disc degeneration at C5-C6 level: Secondary | ICD-10-CM | POA: Diagnosis not present

## 2019-12-02 NOTE — Telephone Encounter (Signed)
To Robert Guerrero, looking for a meniscal tear, failed conservative measures for greater than 6 weeks including NSAIDs and injections.  Arthroscopic surgery is planned.

## 2019-12-03 NOTE — Telephone Encounter (Signed)
Authorized. Imaging contacting patient to schedule.

## 2019-12-07 DIAGNOSIS — M5136 Other intervertebral disc degeneration, lumbar region: Secondary | ICD-10-CM | POA: Diagnosis not present

## 2019-12-07 DIAGNOSIS — M50322 Other cervical disc degeneration at C5-C6 level: Secondary | ICD-10-CM | POA: Diagnosis not present

## 2019-12-07 DIAGNOSIS — M5431 Sciatica, right side: Secondary | ICD-10-CM | POA: Diagnosis not present

## 2019-12-07 DIAGNOSIS — M9903 Segmental and somatic dysfunction of lumbar region: Secondary | ICD-10-CM | POA: Diagnosis not present

## 2019-12-07 DIAGNOSIS — M9905 Segmental and somatic dysfunction of pelvic region: Secondary | ICD-10-CM | POA: Diagnosis not present

## 2019-12-07 DIAGNOSIS — M9901 Segmental and somatic dysfunction of cervical region: Secondary | ICD-10-CM | POA: Diagnosis not present

## 2019-12-07 DIAGNOSIS — M50323 Other cervical disc degeneration at C6-C7 level: Secondary | ICD-10-CM | POA: Diagnosis not present

## 2019-12-07 DIAGNOSIS — M9904 Segmental and somatic dysfunction of sacral region: Secondary | ICD-10-CM | POA: Diagnosis not present

## 2019-12-07 DIAGNOSIS — M531 Cervicobrachial syndrome: Secondary | ICD-10-CM | POA: Diagnosis not present

## 2019-12-09 DIAGNOSIS — M50323 Other cervical disc degeneration at C6-C7 level: Secondary | ICD-10-CM | POA: Diagnosis not present

## 2019-12-09 DIAGNOSIS — M9901 Segmental and somatic dysfunction of cervical region: Secondary | ICD-10-CM | POA: Diagnosis not present

## 2019-12-09 DIAGNOSIS — M9905 Segmental and somatic dysfunction of pelvic region: Secondary | ICD-10-CM | POA: Diagnosis not present

## 2019-12-09 DIAGNOSIS — M531 Cervicobrachial syndrome: Secondary | ICD-10-CM | POA: Diagnosis not present

## 2019-12-09 DIAGNOSIS — M9903 Segmental and somatic dysfunction of lumbar region: Secondary | ICD-10-CM | POA: Diagnosis not present

## 2019-12-09 DIAGNOSIS — M5431 Sciatica, right side: Secondary | ICD-10-CM | POA: Diagnosis not present

## 2019-12-09 DIAGNOSIS — M50322 Other cervical disc degeneration at C5-C6 level: Secondary | ICD-10-CM | POA: Diagnosis not present

## 2019-12-09 DIAGNOSIS — M5136 Other intervertebral disc degeneration, lumbar region: Secondary | ICD-10-CM | POA: Diagnosis not present

## 2019-12-09 DIAGNOSIS — M9904 Segmental and somatic dysfunction of sacral region: Secondary | ICD-10-CM | POA: Diagnosis not present

## 2019-12-12 ENCOUNTER — Ambulatory Visit (INDEPENDENT_AMBULATORY_CARE_PROVIDER_SITE_OTHER): Payer: Medicare HMO

## 2019-12-12 ENCOUNTER — Other Ambulatory Visit: Payer: Self-pay

## 2019-12-12 DIAGNOSIS — M170 Bilateral primary osteoarthritis of knee: Secondary | ICD-10-CM

## 2019-12-12 DIAGNOSIS — M25562 Pain in left knee: Secondary | ICD-10-CM | POA: Diagnosis not present

## 2019-12-14 DIAGNOSIS — M9905 Segmental and somatic dysfunction of pelvic region: Secondary | ICD-10-CM | POA: Diagnosis not present

## 2019-12-14 DIAGNOSIS — M531 Cervicobrachial syndrome: Secondary | ICD-10-CM | POA: Diagnosis not present

## 2019-12-14 DIAGNOSIS — M5136 Other intervertebral disc degeneration, lumbar region: Secondary | ICD-10-CM | POA: Diagnosis not present

## 2019-12-14 DIAGNOSIS — M50323 Other cervical disc degeneration at C6-C7 level: Secondary | ICD-10-CM | POA: Diagnosis not present

## 2019-12-14 DIAGNOSIS — M5431 Sciatica, right side: Secondary | ICD-10-CM | POA: Diagnosis not present

## 2019-12-14 DIAGNOSIS — M50322 Other cervical disc degeneration at C5-C6 level: Secondary | ICD-10-CM | POA: Diagnosis not present

## 2019-12-14 DIAGNOSIS — M9901 Segmental and somatic dysfunction of cervical region: Secondary | ICD-10-CM | POA: Diagnosis not present

## 2019-12-14 DIAGNOSIS — M9904 Segmental and somatic dysfunction of sacral region: Secondary | ICD-10-CM | POA: Diagnosis not present

## 2019-12-14 DIAGNOSIS — M9903 Segmental and somatic dysfunction of lumbar region: Secondary | ICD-10-CM | POA: Diagnosis not present

## 2019-12-16 DIAGNOSIS — M9905 Segmental and somatic dysfunction of pelvic region: Secondary | ICD-10-CM | POA: Diagnosis not present

## 2019-12-16 DIAGNOSIS — M50322 Other cervical disc degeneration at C5-C6 level: Secondary | ICD-10-CM | POA: Diagnosis not present

## 2019-12-16 DIAGNOSIS — M50323 Other cervical disc degeneration at C6-C7 level: Secondary | ICD-10-CM | POA: Diagnosis not present

## 2019-12-16 DIAGNOSIS — M5136 Other intervertebral disc degeneration, lumbar region: Secondary | ICD-10-CM | POA: Diagnosis not present

## 2019-12-16 DIAGNOSIS — M5431 Sciatica, right side: Secondary | ICD-10-CM | POA: Diagnosis not present

## 2019-12-16 DIAGNOSIS — M9901 Segmental and somatic dysfunction of cervical region: Secondary | ICD-10-CM | POA: Diagnosis not present

## 2019-12-16 DIAGNOSIS — M9903 Segmental and somatic dysfunction of lumbar region: Secondary | ICD-10-CM | POA: Diagnosis not present

## 2019-12-16 DIAGNOSIS — M531 Cervicobrachial syndrome: Secondary | ICD-10-CM | POA: Diagnosis not present

## 2019-12-16 DIAGNOSIS — M9904 Segmental and somatic dysfunction of sacral region: Secondary | ICD-10-CM | POA: Diagnosis not present

## 2019-12-27 DIAGNOSIS — E7800 Pure hypercholesterolemia, unspecified: Secondary | ICD-10-CM | POA: Diagnosis not present

## 2019-12-27 LAB — LIPID PANEL W/REFLEX DIRECT LDL
Cholesterol: 163 mg/dL (ref <200)
HDL: 54 mg/dL (ref >=40)
LDL Cholesterol (Calc): 90 mg/dL (calc)
Non-HDL Cholesterol (Calc): 109 mg/dL (calc) (ref <130)
Total CHOL/HDL Ratio: 3 (calc) (ref <5.0)
Triglycerides: 95 mg/dL (ref <150)

## 2019-12-27 LAB — COMPLETE METABOLIC PANEL WITH GFR
AG Ratio: 1.9 (calc) (ref 1.0–2.5)
ALT: 32 U/L (ref 9–46)
AST: 24 U/L (ref 10–35)
Albumin: 4.1 g/dL (ref 3.6–5.1)
Alkaline phosphatase (APISO): 74 U/L (ref 35–144)
BUN: 13 mg/dL (ref 7–25)
CO2: 26 mmol/L (ref 20–32)
Calcium: 8.8 mg/dL (ref 8.6–10.3)
Chloride: 106 mmol/L (ref 98–110)
Creat: 0.98 mg/dL (ref 0.70–1.18)
GFR, Est African American: 90 mL/min/{1.73_m2} (ref >=60)
GFR, Est Non African American: 78 mL/min/{1.73_m2} (ref >=60)
Globulin: 2.2 g/dL (calc) (ref 1.9–3.7)
Glucose, Bld: 82 mg/dL (ref 65–99)
Potassium: 3.8 mmol/L (ref 3.5–5.3)
Sodium: 140 mmol/L (ref 135–146)
Total Bilirubin: 0.7 mg/dL (ref 0.2–1.2)
Total Protein: 6.3 g/dL (ref 6.1–8.1)

## 2020-02-18 ENCOUNTER — Other Ambulatory Visit: Payer: Self-pay | Admitting: Sports Medicine

## 2020-02-18 DIAGNOSIS — R0789 Other chest pain: Secondary | ICD-10-CM

## 2020-02-21 DIAGNOSIS — M1712 Unilateral primary osteoarthritis, left knee: Secondary | ICD-10-CM | POA: Diagnosis not present

## 2020-02-21 DIAGNOSIS — R2689 Other abnormalities of gait and mobility: Secondary | ICD-10-CM | POA: Diagnosis not present

## 2020-02-21 DIAGNOSIS — M25561 Pain in right knee: Secondary | ICD-10-CM | POA: Diagnosis not present

## 2020-02-21 DIAGNOSIS — M25762 Osteophyte, left knee: Secondary | ICD-10-CM | POA: Diagnosis not present

## 2020-02-21 DIAGNOSIS — M25562 Pain in left knee: Secondary | ICD-10-CM | POA: Diagnosis not present

## 2020-02-21 DIAGNOSIS — M1711 Unilateral primary osteoarthritis, right knee: Secondary | ICD-10-CM | POA: Diagnosis not present

## 2020-02-21 DIAGNOSIS — M222X1 Patellofemoral disorders, right knee: Secondary | ICD-10-CM | POA: Diagnosis not present

## 2020-02-21 DIAGNOSIS — M25761 Osteophyte, right knee: Secondary | ICD-10-CM | POA: Diagnosis not present

## 2020-02-21 DIAGNOSIS — M222X2 Patellofemoral disorders, left knee: Secondary | ICD-10-CM | POA: Diagnosis not present

## 2020-02-28 DIAGNOSIS — M2352 Chronic instability of knee, left knee: Secondary | ICD-10-CM | POA: Diagnosis not present

## 2020-02-28 DIAGNOSIS — M1712 Unilateral primary osteoarthritis, left knee: Secondary | ICD-10-CM | POA: Diagnosis not present

## 2020-03-13 DIAGNOSIS — M1712 Unilateral primary osteoarthritis, left knee: Secondary | ICD-10-CM | POA: Diagnosis not present

## 2020-03-20 DIAGNOSIS — M5459 Other low back pain: Secondary | ICD-10-CM | POA: Diagnosis not present

## 2020-03-20 DIAGNOSIS — M1712 Unilateral primary osteoarthritis, left knee: Secondary | ICD-10-CM | POA: Diagnosis not present

## 2020-04-03 DIAGNOSIS — M1712 Unilateral primary osteoarthritis, left knee: Secondary | ICD-10-CM | POA: Diagnosis not present

## 2020-04-10 DIAGNOSIS — M1712 Unilateral primary osteoarthritis, left knee: Secondary | ICD-10-CM | POA: Diagnosis not present

## 2020-05-22 ENCOUNTER — Other Ambulatory Visit: Payer: Self-pay

## 2020-05-22 ENCOUNTER — Emergency Department (HOSPITAL_BASED_OUTPATIENT_CLINIC_OR_DEPARTMENT_OTHER): Payer: Medicare HMO

## 2020-05-22 ENCOUNTER — Emergency Department (HOSPITAL_BASED_OUTPATIENT_CLINIC_OR_DEPARTMENT_OTHER)
Admission: EM | Admit: 2020-05-22 | Discharge: 2020-05-22 | Disposition: A | Discharge status: Home / Self Care | Payer: Medicare HMO | Attending: Emergency Medicine | Admitting: Emergency Medicine

## 2020-05-22 ENCOUNTER — Other Ambulatory Visit (HOSPITAL_COMMUNITY): Payer: Self-pay | Admitting: Emergency Medicine

## 2020-05-22 ENCOUNTER — Telehealth: Payer: Self-pay

## 2020-05-22 DIAGNOSIS — Z79899 Other long term (current) drug therapy: Secondary | ICD-10-CM | POA: Diagnosis not present

## 2020-05-22 DIAGNOSIS — I10 Essential (primary) hypertension: Secondary | ICD-10-CM | POA: Insufficient documentation

## 2020-05-22 DIAGNOSIS — I471 Supraventricular tachycardia: Secondary | ICD-10-CM | POA: Diagnosis not present

## 2020-05-22 DIAGNOSIS — E860 Dehydration: Secondary | ICD-10-CM | POA: Insufficient documentation

## 2020-05-22 DIAGNOSIS — R0602 Shortness of breath: Secondary | ICD-10-CM | POA: Diagnosis not present

## 2020-05-22 DIAGNOSIS — I517 Cardiomegaly: Secondary | ICD-10-CM | POA: Diagnosis not present

## 2020-05-22 LAB — CBC WITH DIFFERENTIAL/PLATELET
Abs Immature Granulocytes: 0.01 10*3/uL (ref 0.00–0.07)
Basophils Absolute: 0 10*3/uL (ref 0.0–0.1)
Basophils Relative: 1 %
Eosinophils Absolute: 0.2 10*3/uL (ref 0.0–0.5)
Eosinophils Relative: 3 %
HCT: 47.2 % (ref 39.0–52.0)
Hemoglobin: 16.6 g/dL (ref 13.0–17.0)
Immature Granulocytes: 0 %
Lymphocytes Relative: 25 %
Lymphs Abs: 1.5 10*3/uL (ref 0.7–4.0)
MCH: 32.4 pg (ref 26.0–34.0)
MCHC: 35.2 g/dL (ref 30.0–36.0)
MCV: 92 fL (ref 80.0–100.0)
Monocytes Absolute: 1 10*3/uL (ref 0.1–1.0)
Monocytes Relative: 17 %
Neutro Abs: 3.3 10*3/uL (ref 1.7–7.7)
Neutrophils Relative %: 54 %
Platelets: 200 10*3/uL (ref 150–400)
RBC: 5.13 MIL/uL (ref 4.22–5.81)
RDW: 14.2 % (ref 11.5–15.5)
WBC: 6 10*3/uL (ref 4.0–10.5)
nRBC: 0 % (ref 0.0–0.2)

## 2020-05-22 LAB — BASIC METABOLIC PANEL
Anion gap: 8 (ref 5–15)
BUN: 13 mg/dL (ref 8–23)
CO2: 21 mmol/L — ABNORMAL LOW (ref 22–32)
Calcium: 8.4 mg/dL — ABNORMAL LOW (ref 8.9–10.3)
Chloride: 109 mmol/L (ref 98–111)
Creatinine, Ser: 1.06 mg/dL (ref 0.61–1.24)
GFR, Estimated: >60 mL/min (ref >60)
Glucose, Bld: 123 mg/dL — ABNORMAL HIGH (ref 70–99)
Potassium: 3.6 mmol/L (ref 3.5–5.1)
Sodium: 138 mmol/L (ref 135–145)

## 2020-05-22 LAB — MAGNESIUM: Magnesium: 2.1 mg/dL (ref 1.7–2.4)

## 2020-05-22 LAB — D-DIMER, QUANTITATIVE: D-Dimer, Quant: 0.6 ug/mL-FEU — ABNORMAL HIGH (ref 0.00–0.50)

## 2020-05-22 MED ORDER — METOPROLOL TARTRATE 25 MG PO TABS
25.0000 mg | ORAL_TABLET | Freq: Once | ORAL | Status: AC
  Administered 2020-05-22: 25 mg via ORAL
  Filled 2020-05-22: qty 1

## 2020-05-22 MED ORDER — METOPROLOL TARTRATE 25 MG PO TABS: 25.0000 mg | ORAL_TABLET | Freq: Two times a day (BID) | ORAL | 0 refills | Status: DC

## 2020-05-22 MED ORDER — SODIUM CHLORIDE 0.9 % IV BOLUS
1000.0000 mL | Freq: Once | INTRAVENOUS | Status: AC
  Administered 2020-05-22: 1000 mL via INTRAVENOUS

## 2020-05-22 MED FILL — METOPROLOL TARTRATE 25 MG T: 25 | 30 days supply | Qty: 60 | Fill #0

## 2020-05-22 NOTE — ED Triage Notes (Signed)
During assessment Pt. Heart rate fluctuated between 48 to 70. Also checked manually, heart murmer present as well. Pt denies pain

## 2020-05-22 NOTE — ED Notes (Signed)
ED Provider at bedside. 

## 2020-05-22 NOTE — Telephone Encounter (Signed)
Appt made with Dr. Marisue Ivan for May 25, 2020 at 2:20 pm.  Appears Pt is stil in ER at this point.  Please call Pt to advise of appt made tomorrow.

## 2020-05-22 NOTE — Telephone Encounter (Signed)
-----   Message from Thayer Headings, MD sent at 05/22/2020  1:49 PM EST ----- Mr. Schoenfelder was in the [facility] ER today with palpitations and episodes of SVT He has seen Dr. Stanford Breed and Dr. Lovena Le in 2015 He had stopped his meds because he didn't like how they made him feel Please set up an new patient appt with one of our new doctors Marisue Ivan, Judith Part, [place]) for eval  The ER doctor will start him on either verapamil or metoprolol ( which ever the patient thinks had the fewer side effects   Thanks  PN

## 2020-05-22 NOTE — ED Provider Notes (Signed)
[place] EMERGENCY DEPARTMENT Provider Note   CSN: [id number] Arrival date & time: 05/22/20  1240     History Chief Complaint  Patient presents with  . Shortness of Breath    Robert Guerrero is a 70 y.o. male.  Patient having episodes of palpitations, shortness of breath.  History of SVT.  Used to take medication for it but no longer does it.  Feels dehydrated.  Has been drinking caffeine.  Denies any chest pain or abdominal pain.  The history is provided by the patient.  Shortness of Breath Severity:  Mild Onset quality:  Gradual Timing:  Intermittent Progression:  Waxing and waning Chronicity:  New Context: not activity   Relieved by:  Nothing Worsened by:  Nothing Associated symptoms: no abdominal pain, no chest pain, no cough, no ear pain, no fever, no rash, no sore throat and no vomiting   Risk factors comment:  Hx of SVT      Past Medical History:  Diagnosis Date  . Hyperlipidemia   . Hypertension   . Osteoarthritis of right knee   . Right lumbar radiculitis   . SVT (supraventricular tachycardia) [facility])     Patient Active Problem List   Diagnosis Date Noted  . Bilateral shoulder pain 09/20/2019  . Polyarthralgia 09/06/2019  . Low vitamin B12 level 05/06/2017  . Obstructive uropathy 06/13/2016  . Fatigue 05/16/2016  . GERD (gastroesophageal reflux disease) 05/16/2016  . Obesity 05/16/2016  . Annual physical exam 05/16/2016  . Major depression, chronic 05/16/2016  . Neck mass 07/28/2015  . Bilateral carpal tunnel syndrome 06/05/2015  . SVT (supraventricular tachycardia) ([place]) 07/07/2013  . Essential hypertension 05/19/2013  . Hyperlipidemia 05/19/2013  . Atypical chest pain 03/29/2013  . Right lumbar radiculitis 01/26/2013  . Primary osteoarthritis of both knees 12/29/2012    No past surgical history on file.     Family History  Problem Relation Age of Onset  . CAD Father        MI at age 84-60    Social History   Tobacco Use   . Smoking status: Never Smoker  . Smokeless tobacco: Never Used  Substance Use Topics  . Alcohol use: Yes    Comment: Occasional    Home Medications Prior to Admission medications   Medication Sig Start Date End Date Taking? Authorizing Provider  metoprolol tartrate (LOPRESSOR) 25 MG tablet Take 1 tablet (25 mg total) by mouth 2 (two) times daily. 05/22/20 06/21/20 Yes Lunetta Marina, DO  amLODipine (NORVASC) 2.5 MG tablet Take 0.5 tablets (1.25 mg total) by mouth in the morning and at bedtime. 09/13/19   Silverio Decamp, MD  celecoxib (CELEBREX) 200 MG capsule One to 2 tablets by mouth daily as needed for pain. 05/11/18   Silverio Decamp, MD  cyanocobalamin (,VITAMIN B-12,) 1000 MCG/ML injection Inject 1 mL (1,000 mcg total) into the muscle every 30 (thirty) days. 07/22/19   Silverio Decamp, MD  Evolocumab (REPATHA SURECLICK) 245 MG/ML SOAJ Inject 140 mg into the skin every 14 (fourteen) days. 11/22/19   Silverio Decamp, MD  NITROSTAT 0.4 MG SL tablet DISSOLVE 1 TABLET UNDER TONGUE EVERY 5 MINUTES FOR UP TO 3 DOSES AS NEEDED FOR CHEST PAIN 02/18/20   Silverio Decamp, MD  omega-3 acid ethyl esters (LOVAZA) 1 g capsule TAKE TWO CAPSULES BY MOUTH TWICE A DAY 03/22/19   Silverio Decamp, MD  Zoster Vaccine Adjuvanted [facility]) injection Administer at 0 and 2-6 months for 2 total doses.  To  be administered by pharmacy. 07/22/19   Silverio Decamp, MD    Allergies    Penicillins  Review of Systems   Review of Systems  Constitutional: Negative for chills and fever.  HENT: Negative for ear pain and sore throat.   Eyes: Negative for pain and visual disturbance.  Respiratory: Positive for shortness of breath. Negative for cough.   Cardiovascular: Negative for chest pain and palpitations.  Gastrointestinal: Negative for abdominal pain and vomiting.  Genitourinary: Negative for dysuria and hematuria.  Musculoskeletal: Negative for arthralgias and back  pain.  Skin: Negative for color change and rash.  Neurological: Negative for seizures and syncope.  All other systems reviewed and are negative.   Physical Exam Updated Vital Signs  ED Triage Vitals  Enc Vitals Group     BP 05/22/20 1256 (!) 150/107     Pulse Rate 05/22/20 1256 (!) 54     Resp 05/22/20 1256 (!) 24     Temp 05/22/20 1256 98.2 F (36.8 C)     Temp Source 05/22/20 1256 Oral     SpO2 05/22/20 1255 97 %     Weight 05/22/20 1302 260 lb (117.9 kg)     Height 05/22/20 1302 5\' 11"  (1.803 m)     Head Circumference --      Peak Flow --      Pain Score 05/22/20 1300 0     Pain Loc --      Pain Edu? --      Excl. in [place]? --     Physical Exam Vitals and nursing note reviewed.  Constitutional:      General: He is not in acute distress.    Appearance: He is well-developed and well-nourished. He is not ill-appearing.  HENT:     Head: Normocephalic and atraumatic.     Mouth/Throat:     Mouth: Mucous membranes are moist.  Eyes:     Conjunctiva/sclera: Conjunctivae normal.     Pupils: Pupils are equal, round, and reactive to light.  Cardiovascular:     Rate and Rhythm: Normal rate and regular rhythm.     Pulses: Normal pulses.     Heart sounds: Normal heart sounds. No murmur heard.   Pulmonary:     Effort: Pulmonary effort is normal. No respiratory distress.     Breath sounds: Normal breath sounds. No decreased breath sounds or wheezing.  Abdominal:     Palpations: Abdomen is soft.     Tenderness: There is no abdominal tenderness.  Musculoskeletal:        General: No edema.     Cervical back: Normal range of motion and neck supple.     Right lower leg: No edema.     Left lower leg: No edema.  Skin:    General: Skin is warm and dry.     Capillary Refill: Capillary refill takes less than 2 seconds.  Neurological:     General: No focal deficit present.     Mental Status: He is alert.  Psychiatric:        Mood and Affect: Mood and affect normal.     ED  Results / Procedures / Treatments   Labs (all labs ordered are listed, but only abnormal results are displayed) Labs Reviewed  BASIC METABOLIC PANEL - Abnormal; Notable for the following components:      Result Value   CO2 21 (*)    Glucose, Bld 123 (*)    Calcium 8.4 (*)    All other  components within normal limits  D-DIMER, QUANTITATIVE - Abnormal; Notable for the following components:   D-Dimer, Quant 0.60 (*)    All other components within normal limits  CBC WITH DIFFERENTIAL/PLATELET  MAGNESIUM    EKG EKG Interpretation  Date/Time:  Monday May 22 2020 12:58:09 EST Ventricular Rate:  97 PR Interval:  160 QRS Duration: 98 QT Interval:  348 QTC Calculation: 441 R Axis:   -8 Text Interpretation: Sinus rhythm with marked sinus arrhythmia Anterior infarct , age undetermined Abnormal ECG Confirmed by Lennice Sites [phone]) on 05/22/2020 1:09:26 PM   Radiology DG Chest Portable 1 View  Result Date: 05/22/2020 CLINICAL DATA:  Shortness of breath. EXAM: PORTABLE CHEST 1 VIEW COMPARISON:  None. FINDINGS: The cardiac silhouette is mildly enlarged. No airspace consolidation, edema, sizable pleural effusion, or pneumothorax is identified. No acute osseous abnormality is seen. IMPRESSION: Cardiomegaly without evidence of acute cardiopulmonary process. Electronically Signed   By: Logan Bores M.D.   On: 05/22/2020 13:29    Procedures Procedures   Medications Ordered in ED Medications  sodium chloride 0.9 % bolus 1,000 mL (1,000 mLs Intravenous New Bag/Given 05/22/20 1331)  metoprolol tartrate (LOPRESSOR) tablet 25 mg (25 mg Oral Given 05/22/20 1402)    ED Course  I have reviewed the triage vital signs and the nursing notes.  Pertinent labs & imaging results that were available during my care of the patient were reviewed by me and considered in my medical decision making (see chart for details).    MDM Rules/Calculators/A&P                          Hancel Ion is a  70 year old male who presents to the ED with shortness of breath, palpitations.  History of SVT.  EKG shows sinus arrhythmia rate controlled however in the room patient intermittently going in and out SVT to the 170s.  Patient will have runs for several seconds and then self resolved.  He does admit to heavy caffeine use and not keeping up oral hydration very well.  Denies any chest pain.  States that he wakes up at times with the palpitations.  Chart review shows that he had a history of SVT was on beta-blocker and verapamil but did not like how they made him feel.  He discussed with cardiology about possible ablation but overall seemed to improve and denies any symptoms for the last several years.  He states that he has been heavily using caffeine.  Suspect may be this is the culprit.  Talked with Dr. Cathie Olden with cardiology on the phone and after joint decision with the patient will start him on Lopressor.  He had about 7 or 8 runs of SVT until he got IV fluids and Lopressor here.  He was observed and had no further episodes after medication and fluids.  Patient states he did not like how either medication made him feel but he is willing to possibly try Lopressor again.  He will follow up with cardiology.  Lab work was overall unremarkable.  D-dimer negative and doubt PE.  No chest infection.  No electrolyte abnormality otherwise.  Recommend cessation of caffeine and increase oral hydration.  Understands return precautions and discharged from ED in good condition.  This chart was dictated using voice recognition software.  Despite best efforts to proofread,  errors can occur which can change the documentation meaning.    Final Clinical Impression(s) / ED Diagnoses Final diagnoses:  SVT (supraventricular tachycardia) (  [place])    Rx / DC Orders ED Discharge Orders         Ordered    metoprolol tartrate (LOPRESSOR) 25 MG tablet  2 times daily        05/22/20 [place], [place],  DO 05/22/20 1448

## 2020-05-22 NOTE — ED Triage Notes (Signed)
Pt from home and complains of SOB with rest and excerption for the past week. Pt states being woken up from slep with SOB. Pt denies pain nausea or emesis.

## 2020-05-22 NOTE — Discharge Instructions (Signed)
Follow-up with cardiology to discuss your SVT.  Take Lopressor as prescribed.  Please return to the emergency department if symptoms worsen.

## 2020-05-22 NOTE — ED Notes (Signed)
Noted that patient's cardiac rhyythm is between SVT and NSR.  EDP at bedside during this episode.

## 2020-05-25 ENCOUNTER — Ambulatory Visit: Payer: Medicare HMO | Admitting: Cardiovascular Disease

## 2020-05-28 NOTE — Progress Notes (Signed)
Cardiology Office Note:    Date:  05/29/2020   ID:  Robert Guerrero, DOB 02/11/1951, MRN [id number]  PCP:  Silverio Decamp, MD  Cardiologist:  No primary care provider on file.  Electrophysiologist:  None   Referring MD: Silverio Decamp,*   Chief Complaint  Patient presents with  . Irregular Heart Beat    History of Present Illness:    Robert Guerrero is a 70 y.o. male with a hx of SVT, hypertension, hyperlipidemia who presents as an ED follow-up for SVT.  He was seen in the ED on 05/22/2020.  He presented to the ED with shortness of breath and palpitations.  Was noted to have intermittent runs of SVT to the 170s.  He reported it had significant caffeine intake and was not keeping up with oral hydration.  He was started on metoprolol 25 mg twice daily and discharge.  He previously followed with Dr. Stanford Breed for his SVT, last seen in 2015.  He was referred to Dr. Lovena Le to consider ablation.  He did not tolerate beta-blockers at that time due to fatigue and did not tolerate calcium channel blockers due to constipation.  He reports he has been having shortness of breath.  States that he feels short of breath all the time.  Denies any chest pain.  Has been having palpitations every day, lasting for seconds to minutes.  Has been having lightheadedness but denies any recent syncopal episodes.  Does not exercise.  Drinks 1 cup of coffee every morning and a caffeinated soda in the afternoon.   Past Medical History:  Diagnosis Date  . Hyperlipidemia   . Hypertension   . Osteoarthritis of right knee   . Right lumbar radiculitis   . SVT (supraventricular tachycardia) ([facility])     No past surgical history on file.  Current Medications: Current Meds  Medication Sig  . cyanocobalamin (,VITAMIN B-12,) 1000 MCG/ML injection Inject 1 mL (1,000 mcg total) into the muscle every 30 (thirty) days.  Marland Kitchen omega-3 acid ethyl esters (LOVAZA) 1 g capsule TAKE TWO CAPSULES BY MOUTH TWICE A DAY  .  Zoster Vaccine Adjuvanted [facility]) injection Administer at 0 and 2-6 months for 2 total doses.  To be administered by pharmacy.  . [DISCONTINUED] amLODipine (NORVASC) 2.5 MG tablet Take 0.5 tablets (1.25 mg total) by mouth in the morning and at bedtime.  . [DISCONTINUED] celecoxib (CELEBREX) 200 MG capsule One to 2 tablets by mouth daily as needed for pain.  . [DISCONTINUED] Evolocumab (REPATHA SURECLICK) 235 MG/ML SOAJ Inject 140 mg into the skin every 14 (fourteen) days.  . [DISCONTINUED] metoprolol tartrate (LOPRESSOR) 25 MG tablet Take 1 tablet (25 mg total) by mouth 2 (two) times daily.  . [DISCONTINUED] NITROSTAT 0.4 MG SL tablet DISSOLVE 1 TABLET UNDER TONGUE EVERY 5 MINUTES FOR UP TO 3 DOSES AS NEEDED FOR CHEST PAIN     Allergies:   Penicillins   Social History   Socioeconomic History  . Marital status: Married    Spouse name: Not on file  . Number of children: 2  . Years of education: Not on file  . Highest education level: Not on file  Occupational History    Employer: [employer]    Comment: Regulatory affairs officer  Tobacco Use  . Smoking status: Never Smoker  . Smokeless tobacco: Never Used  Substance and Sexual Activity  . Alcohol use: Yes    Comment: Occasional  . Drug use: Not on file  . Sexual activity: Not on  file  Other Topics Concern  . Not on file  Social History Narrative  . Not on file   Social Determinants of Health   Financial Resource Strain: Not on file  Food Insecurity: Not on file  Transportation Needs: Not on file  Physical Activity: Not on file  Stress: Not on file  Social Connections: Not on file     Family History: The patient's family history includes CAD in his father.  ROS:   Please see the history of present illness.    All other systems reviewed and are negative.  EKGs/Labs/Other Studies Reviewed:    The following studies were reviewed today:   EKG:  EKG is ordered today.  The ekg ordered today demonstrates SVT,  rate 149.  Repeat EKG showed sinus tachycardia with PACs/PVCs and run of SVT, rate 129   Recent Labs: 08/02/2019: TSH 2.21 12/27/2019: ALT 32 05/22/2020: BUN 13; Creatinine, Ser 1.06; Hemoglobin 16.6; Magnesium 2.1; Platelets 200; Potassium 3.6; Sodium 138  Recent Lipid Panel    Component Value Date/Time   CHOL 163 12/27/2019 1001   TRIG 95 12/27/2019 1001   HDL 54 12/27/2019 1001   CHOLHDL 3.0 12/27/2019 1001   LDLCALC 90 12/27/2019 1001    Physical Exam:    VS:  BP 136/82   Pulse (!) 148   Ht 5\' 11"  (1.803 m)   Wt 256 lb 3.2 oz (116.2 kg)   SpO2 97%   BMI 35.73 kg/m     Wt Readings from Last 3 Encounters:  05/29/20 256 lb 3.2 oz (116.2 kg)  05/22/20 260 lb (117.9 kg)  11/22/19 261 lb (118.4 kg)     GEN: Well nourished, well developed in no acute distress HEENT: Normal NECK: No JVD; No carotid bruits CARDIAC: tachycardic, irregular, no murmurs, rubs, gallops RESPIRATORY:  Clear to auscultation without rales, wheezing or rhonchi  ABDOMEN: Soft, non-tender, non-distended MUSCULOSKELETAL:  No edema; No deformity  SKIN: Warm and dry NEUROLOGIC:  Alert and oriented x 3 PSYCHIATRIC:  Normal affect   ASSESSMENT:    1. SVT (supraventricular tachycardia) ([place])   2. Shortness of breath   3. Essential hypertension   4. Hyperlipidemia, unspecified hyperlipidemia type    PLAN:    SVT: Intermittent runs of SVT to 170s during recent ED visit on 05/22/2020.  Was started on metoprolol 25 mg twice daily but states he only took once because thought it may have worsened his hip pain.  Noted to have SVT to 150s in clinic today.  -Strongly encouraged to start metoprolol.  He is agreeable to trying -Check Zio patch x7 days to evaluate SVT burden -Echocardiogram -Previously had seen Dr. Lovena Le to consider ablation in 2015.  Reports he is now interested in ablation, will refer to Dr. Lovena Le  Dyspnea: Reports feeling short of breath all the time.  On recent ED visit, chest x-ray  unremarkable and age-adjusted D-dimer was negative.  Check echocardiogram to evaluate for structural heart disease.  Hyperlipidemia: On Repatha.  LDL 90 on 12/27/2019  Hypertension: On amlodipine 2.5 mg daily.  Appears controlled.  Adding metoprolol as above  RTC in 6 weeks   Medication Adjustments/Labs and Tests Ordered: Current medicines are reviewed at length with the patient today.  Concerns regarding medicines are outlined above.  Orders Placed This Encounter  Procedures  . Ambulatory referral to Cardiac Electrophysiology  . LONG TERM MONITOR (3-14 DAYS)  . EKG 12-Lead  . ECHOCARDIOGRAM COMPLETE   Meds ordered this encounter  Medications  . metoprolol  tartrate (LOPRESSOR) 25 MG tablet    Sig: Take 1 tablet (25 mg total) by mouth 2 (two) times daily.    Dispense:  180 tablet    Refill:  1    Patient Instructions  Medication Instructions:  Start metoprolol tartrate (Lopressor) 25 mg two times daily  *If you need a refill on your cardiac medications before your next appointment, please call your pharmacy*  Testing/Procedures: Your physician has requested that you have an echocardiogram. Echocardiography is a painless test that uses sound waves to create images of your heart. It provides your doctor with information about the size and shape of your heart and how well your heart's chambers and valves are working. This procedure takes approximately one hour. There are no restrictions for this procedure. This will be done at our [facility] location:  [place] has requested you wear your ZIO patch monitor 7 days.   This is a single patch monitor.  [employer] supplies one patch monitor per enrollment.  Additional stickers are not available.   Please do not apply patch if you will be having a Nuclear Stress Test, Echocardiogram, Cardiac CT, MRI, or Chest Xray during the time frame you would be wearing  the monitor. The patch cannot be worn during these tests.  You cannot remove and re-apply the ZIO XT patch monitor.   Your ZIO patch monitor will be sent USPS Priority mail from [facility] directly to your home address. The monitor may also be mailed to a PO BOX if home delivery is not available.   It may take 3-5 days to receive your monitor after you have been enrolled.   Once you have received you monitor, please review enclosed instructions.  Your monitor has already been registered assigning a specific monitor serial # to you.   Applying the monitor   Shave hair from upper left chest.   Hold abrader disc by orange tab.  Rub abrader in 40 strokes over left upper chest as indicated in your monitor instructions.   Clean area with 4 enclosed alcohol pads .  Use all pads to assure are is cleaned thoroughly.  Let dry.   Apply patch as indicated in monitor instructions.  Patch will be place under collarbone on left side of chest with arrow pointing upward.   Rub patch adhesive wings for 2 minutes.Remove white label marked "1".  Remove white label marked "2".  Rub patch adhesive wings for 2 additional minutes.   While looking in a mirror, press and release button in center of patch.  A small green light will flash 3-4 times .  This will be your only indicator the monitor has been turned on.     Do not shower for the first 24 hours.  You may shower after the first 24 hours.   Press button if you feel a symptom. You will hear a small click.  Record Date, Time and Symptom in the Patient Log Book.   When you are ready to remove patch, follow instructions on last 2 pages of Patient Log Book.  Stick patch monitor onto last page of Patient Log Book.   Place Patient Log Book in [place] box.  Use locking tab on box and tape box closed securely.  The [employer] has [employer] on it.  Please place in mailbox as soon as possible.  Your physician should have your test  results  approximately 7 days after the monitor has been mailed back to [facility].   Call [facility] at [phone] if you have questions regarding your ZIO XT patch monitor.  Call them immediately if you see an orange light blinking on your monitor.   If your monitor falls off in less than 4 days contact our Monitor department at [phone].  If your monitor becomes loose or falls off after 4 days call [employer] at [phone] for suggestions on securing your monitor.    Follow-Up: At [facility], you and your health needs are our priority.  As part of our continuing mission to provide you with exceptional heart care, we have created designated Provider Care Teams.  These Care Teams include your primary Cardiologist (physician) and Advanced Practice Providers (APPs -  Physician Assistants and Nurse Practitioners) who all work together to provide you with the care you need, when you need it.  We recommend signing up for the patient portal called "MyChart".  Sign up information is provided on this After Visit Summary.  MyChart is used to connect with patients for Virtual Visits (Telemedicine).  Patients are able to view lab/test results, encounter notes, upcoming appointments, etc.  Non-urgent messages can be sent to your provider as well.   To learn more about what you can do with MyChart, go to [email or web address].    Your next appointment:   6 week(s)  The format for your next appointment:   In Person  Provider:   Oswaldo Milian, MD   Other Instructions You have been referred to Dr. [place] Circle Electrophysiologist        Signed, Donato Heinz, MD  05/29/2020 10:19 PM    [place]

## 2020-05-29 ENCOUNTER — Other Ambulatory Visit: Payer: Self-pay

## 2020-05-29 ENCOUNTER — Ambulatory Visit (INDEPENDENT_AMBULATORY_CARE_PROVIDER_SITE_OTHER): Payer: Medicare HMO

## 2020-05-29 ENCOUNTER — Encounter: Payer: Self-pay | Admitting: *Deleted

## 2020-05-29 ENCOUNTER — Encounter: Payer: Self-pay | Admitting: Cardiology

## 2020-05-29 ENCOUNTER — Ambulatory Visit: Payer: Medicare HMO | Admitting: Cardiology

## 2020-05-29 VITALS — BP 136/82 | HR 148 | Ht 71.0 in | Wt 256.2 lb

## 2020-05-29 DIAGNOSIS — I10 Essential (primary) hypertension: Secondary | ICD-10-CM | POA: Diagnosis not present

## 2020-05-29 DIAGNOSIS — R0602 Shortness of breath: Secondary | ICD-10-CM | POA: Diagnosis not present

## 2020-05-29 DIAGNOSIS — I471 Supraventricular tachycardia, unspecified: Secondary | ICD-10-CM

## 2020-05-29 DIAGNOSIS — E785 Hyperlipidemia, unspecified: Secondary | ICD-10-CM

## 2020-05-29 MED ORDER — METOPROLOL TARTRATE 25 MG PO TABS: 25.0000 mg | ORAL_TABLET | Freq: Two times a day (BID) | ORAL | 1 refills | Status: DC

## 2020-05-29 NOTE — Patient Instructions (Addendum)
Medication Instructions:  Start metoprolol tartrate (Lopressor) 25 mg two times daily  *If you need a refill on your cardiac medications before your next appointment, please call your pharmacy*  Testing/Procedures: Your physician has requested that you have an echocardiogram. Echocardiography is a painless test that uses sound waves to create images of your heart. It provides your doctor with information about the size and shape of your heart and how well your heart's chambers and valves are working. This procedure takes approximately one hour. There are no restrictions for this procedure. This will be done at our [facility] location:  [place] has requested you wear your ZIO patch monitor 7 days.   This is a single patch monitor.  [employer] supplies one patch monitor per enrollment.  Additional stickers are not available.   Please do not apply patch if you will be having a Nuclear Stress Test, Echocardiogram, Cardiac CT, MRI, or Chest Xray during the time frame you would be wearing the monitor. The patch cannot be worn during these tests.  You cannot remove and re-apply the ZIO XT patch monitor.   Your ZIO patch monitor will be sent USPS Priority mail from [facility] directly to your home address. The monitor may also be mailed to a PO BOX if home delivery is not available.   It may take 3-5 days to receive your monitor after you have been enrolled.   Once you have received you monitor, please review enclosed instructions.  Your monitor has already been registered assigning a specific monitor serial # to you.   Applying the monitor   Shave hair from upper left chest.   Hold abrader disc by orange tab.  Rub abrader in 40 strokes over left upper chest as indicated in your monitor instructions.   Clean area with 4 enclosed alcohol pads .  Use all pads to assure are is cleaned thoroughly.  Let dry.    Apply patch as indicated in monitor instructions.  Patch will be place under collarbone on left side of chest with arrow pointing upward.   Rub patch adhesive wings for 2 minutes.Remove white label marked "1".  Remove white label marked "2".  Rub patch adhesive wings for 2 additional minutes.   While looking in a mirror, press and release button in center of patch.  A small green light will flash 3-4 times .  This will be your only indicator the monitor has been turned on.     Do not shower for the first 24 hours.  You may shower after the first 24 hours.   Press button if you feel a symptom. You will hear a small click.  Record Date, Time and Symptom in the Patient Log Book.   When you are ready to remove patch, follow instructions on last 2 pages of Patient Log Book.  Stick patch monitor onto last page of Patient Log Book.   Place Patient Log Book in [place] box.  Use locking tab on box and tape box closed securely.  The [employer] has [employer] on it.  Please place in mailbox as soon as possible.  Your physician should have your test results approximately 7 days after the monitor has been mailed back to [facility].   Call [place] at [phone] if you have questions regarding your ZIO XT patch monitor.  Call them immediately if you see an  orange light blinking on your monitor.   If your monitor falls off in less than 4 days contact our Monitor department at [phone].  If your monitor becomes loose or falls off after 4 days call [employer] at [phone] for suggestions on securing your monitor.    Follow-Up: At [facility], you and your health needs are our priority.  As part of our continuing mission to provide you with exceptional heart care, we have created designated Provider Care Teams.  These Care Teams include your primary Cardiologist (physician) and Advanced Practice Providers (APPs -  Physician Assistants and Nurse Practitioners) who  all work together to provide you with the care you need, when you need it.  We recommend signing up for the patient portal called "MyChart".  Sign up information is provided on this After Visit Summary.  MyChart is used to connect with patients for Virtual Visits (Telemedicine).  Patients are able to view lab/test results, encounter notes, upcoming appointments, etc.  Non-urgent messages can be sent to your provider as well.   To learn more about what you can do with MyChart, go to [email or web address].    Your next appointment:   6 week(s)  The format for your next appointment:   In Person  Provider:   Oswaldo Milian, MD   Other Instructions You have been referred to Dr. Golden Circle Electrophysiologist

## 2020-05-29 NOTE — Progress Notes (Signed)
Patient ID: Robert Guerrero, male   DOB: [birth year], 70 y.o.   MRN: [id number] Patient enrolled for [employer] to ship a 7 day ZIO XT to his home.

## 2020-06-02 DIAGNOSIS — I471 Supraventricular tachycardia: Secondary | ICD-10-CM | POA: Diagnosis not present

## 2020-06-12 ENCOUNTER — Encounter: Payer: Self-pay | Admitting: Internal Medicine

## 2020-06-12 ENCOUNTER — Telehealth: Payer: Self-pay | Admitting: Cardiology

## 2020-06-12 ENCOUNTER — Other Ambulatory Visit: Payer: Self-pay

## 2020-06-12 ENCOUNTER — Ambulatory Visit: Payer: Medicare HMO | Admitting: Internal Medicine

## 2020-06-12 VITALS — BP 160/96 | HR 68 | Ht 71.0 in | Wt 260.0 lb

## 2020-06-12 DIAGNOSIS — I471 Supraventricular tachycardia: Secondary | ICD-10-CM

## 2020-06-12 MED ORDER — FUROSEMIDE 20 MG PO TABS: 20.0000 mg | ORAL_TABLET | Freq: Every day | ORAL | 3 refills | Status: DC

## 2020-06-12 NOTE — Telephone Encounter (Signed)
Spoke with Vivien Rota from [facility] who is calling to report results from patient's heart monitor that he wore from March 4th-9th. He has 1,089 episodes of SVT with one episode of SVT at 194 bpm for 60 seconds. Located on strip 10, March 8th at 9:54 am. He also had 20% atrial fibrillation burden. He was symptomatic for both SVT and A Fib. Results are already available in patient's chart. Patient saw Dr. Lovena Le today

## 2020-06-12 NOTE — Progress Notes (Signed)
HPI Robert Guerrero is referred by Dr. Gardiner Rhyme for evaluation of SVT. He is a pleasant 70 yo man I had seen 5 years ago with SVT. He declined ablation at that time. He was seen in the ED several weeks ago with more palpitations and sob. He had what looks like mostly non-sustained SVT. He complains mainly of sob, not associated with heart racing. He has not had syncope. He admits to drinking a lot of water to "flush out his system." He denies anginal symptoms. He has peripheral edema. He has a 2D echo and a cardiac monitor pending. Allergies  Allergen Reactions  . Penicillins Hives     Current Outpatient Medications  Medication Sig Dispense Refill  . omega-3 acid ethyl esters (LOVAZA) 1 g capsule TAKE TWO CAPSULES BY MOUTH TWICE A DAY 360 capsule 2  . Zoster Vaccine Adjuvanted [facility]) injection Administer at 0 and 2-6 months for 2 total doses.  To be administered by pharmacy. 2 each 0   No current facility-administered medications for this visit.     Past Medical History:  Diagnosis Date  . Hyperlipidemia   . Hypertension   . Osteoarthritis of right knee   . Right lumbar radiculitis   . SVT (supraventricular tachycardia) (HCC)     ROS:   All systems reviewed and negative except as noted in the HPI.   History reviewed. No pertinent surgical history.   Family History  Problem Relation Age of Onset  . CAD Father        MI at age 32-60     Social History   Socioeconomic History  . Marital status: Married    Spouse name: Not on file  . Number of children: 2  . Years of education: Not on file  . Highest education level: Not on file  Occupational History    Employer: [employer]    Comment: Regulatory affairs officer  Tobacco Use  . Smoking status: Never Smoker  . Smokeless tobacco: Never Used  Substance and Sexual Activity  . Alcohol use: Yes    Comment: Occasional  . Drug use: Not on file  . Sexual activity: Not on file  Other Topics Concern  . Not  on file  Social History Narrative  . Not on file   Social Determinants of Health   Financial Resource Strain: Not on file  Food Insecurity: Not on file  Transportation Needs: Not on file  Physical Activity: Not on file  Stress: Not on file  Social Connections: Not on file  Intimate Partner Violence: Not on file     BP (!) 160/96   Pulse 68   Ht 5\' 11"  (1.803 m)   Wt 260 lb (117.9 kg)   SpO2 96%   BMI 36.26 kg/m   Physical Exam:  Well appearing NAD HEENT: Unremarkable Neck:  No JVD, no thyromegally Lymphatics:  No adenopathy Back:  No CVA tenderness Lungs:  Clear with no wheezes HEART:  Regular rate rhythm, no murmurs, no rubs, no clicks Abd:  soft, positive bowel sounds, no organomegally, no rebound, no guarding Ext:  2 plus pulses, no edema, no cyanosis, no clubbing Skin:  No rashes no nodules Neuro:  CN II through XII intact, motor grossly intact   Assess/Plan: 1. SVT - we will plan to review his cardiac montior but I do not get the sense that this is the main driver of his sob.  2. Sob - He has a 2D echo pending and  I suspect pulmonary congestion is the main problem and I have recommended a trial of low dose lasix and told him to stop drinking as much fluid.  [name],MD

## 2020-06-12 NOTE — Patient Instructions (Addendum)
Medication Instructions:  Your physician has recommended you make the following change in your medication:   1.  Start taking lasix 20 mg- Take one tablet by mouth daily  Labwork: None ordered.  Testing/Procedures: None ordered.  Follow-Up: Your physician wants you to follow-up based on results of ECHO and heart monitor.  Any Other Special Instructions Will Be Listed Below (If Applicable).  If you need a refill on your cardiac medications before your next appointment, please call your pharmacy.

## 2020-06-20 ENCOUNTER — Telehealth: Payer: Self-pay | Admitting: Cardiology

## 2020-06-20 MED ORDER — APIXABAN 5 MG PO TABS: 5.0000 mg | ORAL_TABLET | Freq: Two times a day (BID) | ORAL | 6 refills | Status: DC

## 2020-06-20 MED ORDER — METOPROLOL TARTRATE 25 MG PO TABS: 25.0000 mg | ORAL_TABLET | Freq: Two times a day (BID) | ORAL | 3 refills | Status: DC

## 2020-06-20 NOTE — Telephone Encounter (Signed)
Follow up:     Patient returning a call back from yesterday. Please call patient.

## 2020-06-20 NOTE — Telephone Encounter (Signed)
Per Dr Gardiner Rhyme: New Afib seen. Also with very frequent episodes of SVT. CHADSVASC score 2 (age, HTN), normal renal function/Hgb on recent labs, recommend started on Eliquis 5 mg BID. Had also recommended starting metoprolol 25 mg twice daily, but is no longer on med list, would strongly recommend taking metoprolol. Can we schedule appointment in next few weeks to discuss further?   Pt notified of above results. Pt has appt scheduled in 2 weeks already.  Pt states that he tried to start the metoprolol 25 BID but states that he "can't function" at work. He states that he is very SOB when he is working. He states that he will re-start Metoprolol and see if this helps. He states that he will start the Eliquis and "see how it goes". He will start today and will CB if any SE.  New rx sent to requested pharmacy.

## 2020-06-23 ENCOUNTER — Telehealth: Payer: Self-pay | Admitting: Cardiology

## 2020-06-23 NOTE — Telephone Encounter (Signed)
Looks like he has a CHADS2-VASc score of 2 (age, htn).  I have not had anybody feel those "symptoms" from Eliquis in the past.  You could try a sample of Xarelto to see if he tolerates that, but not many other choices.  Doubt he would want to do INR checks with warfarin

## 2020-06-23 NOTE — Telephone Encounter (Signed)
Returned call to patient who states that he feels that since he has started the Eliquis 5mg  BID his symptoms have worsened. Patient states he is dizzy and is having a hard time working due to that. Patient states that he feels as if his heart is pounding at times but denies any chest pain, patient states that his heart beats just feel intense. Patient states he also feels weak as well. Patient denies any syncope and but states he did have a moment on Tuesday where he felt like he was going to pass out but did not. Patient denies any lightheadedness at this time but states that he feels dizzy sort of like he has been drinking but states he has not consumed any alcohol. Patient state the only medication he is taking at this time is the Eliquis. Patient states he is no longer taking the metoprolol, lasix or lovaza. Patient states he feels that the other medications exacerbate his symptoms as well. Patient reports his last dosages of those medications were several days ago. Patient reports that his recent blood pressure was 145/84 with HR 60-80bpm. Patient denies any chest pain, tightness, and states his shortness of breath is actually better.    Patient would like to know if Dr. Gardiner Rhyme has any recommendations regarding this issue. Patient states he feels like the Eliquis is the cause of these symptoms.   Advised patient I would forward message to Dr. Gardiner Rhyme and PharmD.  Made patient aware of ED precautions should he develop new or worsening symptoms. Patient verbalized understanding.   Patient has Echo scheduled 3/28. Patient has follow up OV 4/5 with Dr. Gardiner Rhyme.

## 2020-06-23 NOTE — Telephone Encounter (Signed)
Pt c/o medication issue:  1. Name of Medication: apixaban (ELIQUIS) 5 MG TABS tablet  2. How are you currently taking this medication (dosage and times per day)? 1 tablet twice a day  3. Are you having a reaction (difficulty breathing--STAT)? no  4. What is your medication issue? Patient states he thinks the medication is making his symptoms worse. He states he feels woozy headed and the pounding in his chest feels more intense. He states the beats are not as high. He states he started the medication Tuesday.

## 2020-06-23 NOTE — Telephone Encounter (Signed)
?   xarelto dosage

## 2020-06-23 NOTE — Telephone Encounter (Signed)
Agree with plan, can try Xarelto

## 2020-06-26 ENCOUNTER — Ambulatory Visit (HOSPITAL_COMMUNITY): Payer: Medicare HMO | Attending: Internal Medicine

## 2020-06-26 ENCOUNTER — Other Ambulatory Visit: Payer: Self-pay

## 2020-06-26 DIAGNOSIS — I471 Supraventricular tachycardia: Secondary | ICD-10-CM | POA: Diagnosis not present

## 2020-06-26 DIAGNOSIS — R0602 Shortness of breath: Secondary | ICD-10-CM | POA: Diagnosis not present

## 2020-06-26 LAB — ECHOCARDIOGRAM COMPLETE
Area-P 1/2: 3.31 cm2
MV M vel: 4.57 m/s
MV Peak grad: 83.6 mmHg
S' Lateral: 4.6 cm

## 2020-06-26 MED ORDER — PERFLUTREN LIPID MICROSPHERE
1.0000 mL | INTRAVENOUS | Status: AC | PRN
  Administered 2020-06-26: 3 mL via INTRAVENOUS

## 2020-06-26 NOTE — Telephone Encounter (Signed)
Xarelto 20mg  daily with the largest meal of the day

## 2020-06-26 NOTE — Telephone Encounter (Signed)
Spoke with pt, aware of recommendations. Samples placed at the front desk for pick up

## 2020-06-28 ENCOUNTER — Other Ambulatory Visit: Payer: Self-pay | Admitting: *Deleted

## 2020-06-28 MED ORDER — RIVAROXABAN 20 MG PO TABS: 20.0000 mg | ORAL_TABLET | Freq: Every day | ORAL | 0 refills | Status: DC

## 2020-06-28 NOTE — Telephone Encounter (Signed)
Pt c/o medication issue:  1. Name of Medication: Xarelto 20 mg daily   2. How are you currently taking this medication (dosage and times per day)? Once a day with the heaviest meal   3. Are you having a reaction (difficulty breathing--STAT)? Yes   4. What is your medication issue? Robert Guerrero is calling stating this medication is making him very angry and irritable. He states it really started this morning he woke up out of his sleep angry for no reason. He is requesting a callback to discuss an alternative or being switched back to Eliquis. He also reports his symptoms have not gotten any better since this medication change. Please call Robert Guerrero back on his work number listed in the contact info of this telephone note. He states last time he requested a callback at his work number and they caled his home. Please advise.

## 2020-06-28 NOTE — Telephone Encounter (Signed)
Spoke to patient-patient states his symptoms have continued and worsened since switching to Xarelto on Monday.  He states he continues to be extremely dizzy, reports "bumping into things" while he is at work although denies any syncopal episodes or falls.   Reports unable to sleep and waking up overnight angry and confused, states it is difficult to describe, took 2-3 hours to calm down.   Feels very sluggish, constipated, and increased SOB.   He states he gets SOB with minimal exertion.  Denies swelling, reports weighing at home and weights have been "steady", denies increase.  Reports he has no appetite so he has not been eating much.   Reports BP running 110-315X systolic, HR this AM [phone] but states this did resolve.   Reports all of these symptoms started when he started the Eliquis.    He is not taking any other medication currently.    Reports being unable to tolerate medications and supplements in the past.  States he was prescribed furosemide (Lasix) at appt with Dr. Lovena Le, has not taken this as he was unsure what this was for.    Per chart review:  OV 02/28 Dr. Maryelizabeth Kaufmann Monitor placed-new afib + frequent SVT Echo-ef 35%  OV 03/14 with Dr. Lovena Le -Started furosemide  -Follow up based on results  Has upcoming appt 4/5 with Dr. Gardiner Rhyme.    Advised would send message to Dr. Gardiner Rhyme and pharmacy to review.    No sooner openings this week.   Offered appt Friday at 79 with Dr. Ceasar Mons to come due to work event (he and his wife are working).

## 2020-06-28 NOTE — Telephone Encounter (Signed)
Scheduled patient to see Dr. Gardiner Rhyme Friday at 3:40 pm to assess further-ok per Dr. Gardiner Rhyme.  Patient verbalized understanding.

## 2020-06-28 NOTE — Addendum Note (Signed)
Addended by: Patria Mane A on: 06/28/2020 03:23 PM   Modules accepted: Orders

## 2020-06-28 NOTE — Telephone Encounter (Signed)
Yes pt can change back to Eliquis since his symptoms did not improve upon switching to Xarelto. Symptoms do not seem related to anticoagulation, however other anticoagulant options would include Pradaxa (Tier 4 on his plan - more expensive at $95/month) or warfarin which would require frequent in-office monitoring. [facility] is not on his formulary. Would recommend that pt discuss ongoing symptoms at upcoming visit with Dr Gardiner Rhyme on 4/5.

## 2020-06-29 NOTE — Progress Notes (Deleted)
Cardiology Office Note:    Date:  06/29/2020   ID:  Robert Guerrero, DOB 1950/06/03, MRN [id number]  PCP:  Robert Decamp, MD  Cardiologist:  No primary care provider on file.  Electrophysiologist:  None   Referring MD: Robert Guerrero,*   No chief complaint on file.   History of Present Illness:    Robert Guerrero is a 70 y.o. male with a hx of SVT, hypertension, hyperlipidemia who presents for follow-up.  He was initially seen as an ED follow-up for SVT on 05/29/2020.  He was seen in the ED on 05/22/2020.  He presented to the ED with shortness of breath and palpitations.  Was noted to have intermittent runs of SVT to the 170s.  He reported it had significant caffeine intake and was not keeping up with oral hydration.  He was started on metoprolol 25 mg twice daily and discharge.  He previously followed with Dr. Stanford Guerrero for his SVT, last seen in 2015.  He was referred to Dr. Lovena Guerrero to consider ablation.  He did not tolerate beta-blockers at that time due to fatigue and did not tolerate calcium channel blockers due to constipation.  He reports he has been having shortness of breath.  States that he feels short of breath all the time.  Denies any chest pain.  Has been having palpitations every day, lasting for seconds to minutes.  Has been having lightheadedness but denies any recent syncopal episodes.  Does not exercise.  Drinks 1 cup of coffee every morning and a caffeinated soda in the afternoon.  Echocardiogram 06/26/2020 showed LVEF 35%, global hypokinesis, mild LV dilatation, grade 2 diastolic dysfunction, mild RV systolic dysfunction, mild to moderate left atrial dilatation, mild to moderate MR, mild AI.  Zio patch x5 days on 06/12/2020 showed 20% A. fib burden, average rate 134 with longest episode lasting 5.5 hours with average rate 155 bpm.  There were 1089 episodes of SVT, longest lasting 14 minutes with average rate 176 bpm.  Frequent PACs (6.2%) along with occasional  supraventricular couplets (2.7%) and triplets (1.1%).  Since last clinic visit, Feels short of breath.  ?repatha, amlodipine   Past Medical History:  Diagnosis Date  . Hyperlipidemia   . Hypertension   . Osteoarthritis of right knee   . Right lumbar radiculitis   . SVT (supraventricular tachycardia) (HCC)     No past surgical history on file.  Current Medications: No outpatient medications have been marked as taking for the 06/30/20 encounter (Appointment) with Robert Heinz, MD.     Allergies:   Penicillins   Social History   Socioeconomic History  . Marital status: Married    Spouse name: Not on file  . Number of children: 2  . Years of education: Not on file  . Highest education level: Not on file  Occupational History    Employer: [employer]    Comment: Regulatory affairs officer  Tobacco Use  . Smoking status: Never Smoker  . Smokeless tobacco: Never Used  Substance and Sexual Activity  . Alcohol use: Yes    Comment: Occasional  . Drug use: Not on file  . Sexual activity: Not on file  Other Topics Concern  . Not on file  Social History Narrative  . Not on file   Social Determinants of Health   Financial Resource Strain: Not on file  Food Insecurity: Not on file  Transportation Needs: Not on file  Physical Activity: Not on file  Stress: Not on file  Social Connections: Not on file     Family History: The patient's family history includes CAD in his father.  ROS:   Please see the history of present illness.    All other systems reviewed and are negative.  EKGs/Labs/Other Studies Reviewed:    The following studies were reviewed today:   EKG:  EKG is ordered today.  The ekg ordered today demonstrates SVT, rate 149.  Repeat EKG showed sinus tachycardia with PACs/PVCs and run of SVT, rate 129   Recent Labs: 08/02/2019: TSH 2.21 12/27/2019: ALT 32 05/22/2020: BUN 13; Creatinine, Ser 1.06; Hemoglobin 16.6; Magnesium 2.1; Platelets 200;  Potassium 3.6; Sodium 138  Recent Lipid Panel    Component Value Date/Time   CHOL 163 12/27/2019 1001   TRIG 95 12/27/2019 1001   HDL 54 12/27/2019 1001   CHOLHDL 3.0 12/27/2019 1001   LDLCALC 90 12/27/2019 1001    Physical Exam:    VS:  There were no vitals taken for this visit.    Wt Readings from Last 3 Encounters:  06/12/20 260 lb (117.9 kg)  05/29/20 256 lb 3.2 oz (116.2 kg)  05/22/20 260 lb (117.9 kg)     GEN: Well nourished, well developed in no acute distress HEENT: Normal NECK: No JVD; No carotid bruits CARDIAC: tachycardic, irregular, no murmurs, rubs, gallops RESPIRATORY:  Clear to auscultation without rales, wheezing or rhonchi  ABDOMEN: Soft, non-tender, non-distended MUSCULOSKELETAL:  No edema; No deformity  SKIN: Warm and dry NEUROLOGIC:  Alert and oriented x 3 PSYCHIATRIC:  Normal affect   ASSESSMENT:    No diagnosis found. PLAN:    Acute combined systolic diastolic heart failure: Echocardiogram 06/26/2020 showed LVEF 35%, global hypokinesis, mild LV dilatation, grade 2 diastolic dysfunction, mild RV systolic dysfunction, mild to moderate left atrial dilatation, mild to moderate MR, mild AI.   -New diagnosis.  Recommend RHC/LHC to work-up for cardiomyopathy  SVT: Intermittent runs of SVT to 170s during recent ED visit on 05/22/2020.  Was started on metoprolol 25 mg twice daily but states he only took once because thought it may have worsened his hip pain.  Zio patch x5 days on 06/12/2020 showed 20% A. fib burden, average rate 134 with longest episode lasting 5.5 hours with average rate 155 bpm.  There were 1089 episodes of SVT, longest lasting 14 minutes with average rate 176 bpm.  Frequent PACs (6.2%) along with occasional supraventricular couplets (2.7%) and triplets (1.1%). -Referred to EP  Hyperlipidemia: Took himself off of Repatha 2-3 months ago.  LDL 90 on 12/27/2019  Hypertension: Was on amlodipine 2.5 mg daily.  Appears controlled.  Adding metoprolol  as above  RTC in 6 weeks   Medication Adjustments/Labs and Tests Ordered: Current medicines are reviewed at length with the patient today.  Concerns regarding medicines are outlined above.  No orders of the defined types were placed in this encounter.  No orders of the defined types were placed in this encounter.   There are no Patient Instructions on file for this visit.   Signed, Robert Heinz, MD  06/29/2020 11:40 PM    [place]

## 2020-06-30 ENCOUNTER — Ambulatory Visit: Payer: Medicare HMO | Admitting: Cardiology

## 2020-06-30 ENCOUNTER — Other Ambulatory Visit: Payer: Self-pay

## 2020-06-30 ENCOUNTER — Encounter: Payer: Self-pay | Admitting: Cardiology

## 2020-06-30 VITALS — BP 140/92 | HR 81 | Ht 71.0 in | Wt 247.0 lb

## 2020-06-30 DIAGNOSIS — E785 Hyperlipidemia, unspecified: Secondary | ICD-10-CM

## 2020-06-30 DIAGNOSIS — I480 Paroxysmal atrial fibrillation: Secondary | ICD-10-CM | POA: Diagnosis not present

## 2020-06-30 DIAGNOSIS — I5041 Acute combined systolic (congestive) and diastolic (congestive) heart failure: Secondary | ICD-10-CM

## 2020-06-30 DIAGNOSIS — I471 Supraventricular tachycardia: Secondary | ICD-10-CM

## 2020-06-30 DIAGNOSIS — I10 Essential (primary) hypertension: Secondary | ICD-10-CM

## 2020-06-30 MED ORDER — LOSARTAN POTASSIUM 25 MG PO TABS: 25.0000 mg | ORAL_TABLET | Freq: Every day | ORAL | 3 refills | Status: DC

## 2020-06-30 MED ORDER — APIXABAN 5 MG PO TABS: 5.0000 mg | ORAL_TABLET | Freq: Two times a day (BID) | ORAL | 3 refills | Status: DC

## 2020-06-30 MED ORDER — CARVEDILOL 3.125 MG PO TABS: 3.1250 mg | ORAL_TABLET | Freq: Two times a day (BID) | ORAL | 3 refills | Status: DC

## 2020-06-30 NOTE — Patient Instructions (Addendum)
Medication Instructions:  Stop Xarelto Start Eliquis 5 mg twice a day Start Losartan 25 mg daily Start Carvedilol 3.125 mg twice a day Continue all other medications *If you need a refill on your cardiac medications before your next appointment, please call your pharmacy*   Lab Work: Bmet,cbc,tsh today    Covid Test to be done at [facility]  Monday 07/03/20 at 12:55 pm   Quarantine until after cath   Testing/Procedures: Cardiac cath scheduled Wed 4/6   Follow instructions below   Follow-Up: At [facility], you and your health needs are our priority.  As part of our continuing mission to provide you with exceptional heart care, we have created designated Provider Care Teams.  These Care Teams include your primary Cardiologist (physician) and Advanced Practice Providers (APPs -  Physician Assistants and Nurse Practitioners) who all work together to provide you with the care you need, when you need it.  We recommend signing up for the patient portal called "MyChart".  Sign up information is provided on this After Visit Summary.  MyChart is used to connect with patients for Virtual Visits (Telemedicine).  Patients are able to view lab/test results, encounter notes, upcoming appointments, etc.  Non-urgent messages can be sent to your provider as well.   To learn more about what you can do with MyChart, go to [email or web address].    Your next appointment:  Tues  5/3 at 11:40 am   The format for your next appointment: Office    Provider:  [place] [address] Dept: [phone] Loc: [phone]  Robert Guerrero  06/30/2020  You are scheduled for a Cardiac Cath on Wednesday 07/05/20, with Dr.Jordan.  1. Please arrive at the [facility] (Main Entrance A) at [facility]: [address] [place], [place] [address] at  6:30 am (This time is two hours before your procedure to ensure your preparation). Free valet parking service is available.   Special note: Every effort is made to have your procedure done on time. Please understand that emergencies sometimes delay scheduled procedures.  2. Diet: Do not eat solid foods after midnight.  The patient may have clear liquids until 5am upon the day of the procedure.  3. Labs: You will need to have blood drawn on Friday at [place] office  . You do not need to be fasting.  Covid Test  Scheduled Monday 07/04/20 at 12:55 pm at [facility] until after cath  4. Medication instructions in preparation for your procedure:   Hold Eliquis 2 days before cath  ( Last dose Sunday 4/3 )    Hold Furosemide morning of cath    On the morning of your procedure, take Aspirin 81 mg and any morning medicines NOT listed above.  You may use sips of water.  5. Plan for one night stay--bring personal belongings. 6. Bring a current list of your medications and current insurance cards. 7. You MUST have a responsible person to drive you home. 8. Someone MUST be with you the first 24 hours after you arrive home or your discharge will be delayed. 9. Please wear clothes that are easy to get on and off and wear slip-on shoes.  Thank you for allowing [place] to care for you!   -- [place] [facility]

## 2020-06-30 NOTE — Progress Notes (Signed)
Cardiology Office Note:    Date:  07/01/2020   ID:  Robert Guerrero, DOB 1950-06-11, MRN [id number]  PCP:  Silverio Decamp, MD  Cardiologist:  No primary care provider on file.  Electrophysiologist:  None   Referring MD: Silverio Decamp,*   Chief Complaint  Patient presents with  . New Patient (Initial Visit)  . Headache  . Shortness of Breath    History of Present Illness:    Robert Guerrero is a 70 y.o. male with a hx of SVT, hypertension, hyperlipidemia who presents for follow-up.  He was initially seen as an ED follow-up for SVT on 05/29/2020.  He was seen in the ED on 05/22/2020.  He presented to the ED with shortness of breath and palpitations.  Was noted to have intermittent runs of SVT to the 170s.  He reported it had significant caffeine intake and was not keeping up with oral hydration.  He was started on metoprolol 25 mg twice daily and discharge.  He previously followed with Dr. Stanford Breed for his SVT, last seen in 2015.  He was referred to Dr. Lovena Le to consider ablation.  He did not tolerate beta-blockers at that time due to fatigue and did not tolerate calcium channel blockers due to constipation.  Echocardiogram 06/26/2020 showed LVEF 35%, global hypokinesis, mild LV dilatation, grade 2 diastolic dysfunction, mild RV systolic dysfunction, mild to moderate left atrial dilatation, mild to moderate MR, mild AI.  Zio patch x5 days on 06/12/2020 showed 20% A. fib burden, average rate 134 with longest episode lasting 5.5 hours with average rate 155 bpm.  There were 1089 episodes of SVT, longest lasting 14 minutes with average rate 176 bpm.  Frequent PACs (6.2%) along with occasional supraventricular couplets (2.7%) and triplets (1.1%).  He is accompanied by his wife. Since his last clinic visit, he has been complaining of shortness of breath that has worsened over the past 6 weeks.  It has been affecting his sleep. He has no chest pain but he does have palpitations. He reports  being able to tolerate the palpitation more than his shortness of breath. He has lightheadedness. He started Lasix 2 days ago, however it makes his feel like he is "drunk" and fatigue after. He stopped taking his Repatha, Eliquis, and Amlodipine.   Past Medical History:  Diagnosis Date  . Hyperlipidemia   . Hypertension   . Osteoarthritis of right knee   . Right lumbar radiculitis   . SVT (supraventricular tachycardia) ([place])     History reviewed. No pertinent surgical history.  Current Medications: Current Meds  Medication Sig  . apixaban (ELIQUIS) 5 MG TABS tablet Take 1 tablet (5 mg total) by mouth 2 (two) times daily.  . carvedilol (COREG) 3.125 MG tablet Take 1 tablet (3.125 mg total) by mouth 2 (two) times daily.  . furosemide (LASIX) 20 MG tablet Take 1 tablet (20 mg total) by mouth daily.  Marland Kitchen losartan (COZAAR) 25 MG tablet Take 1 tablet (25 mg total) by mouth daily.  Marland Kitchen Zoster Vaccine Adjuvanted [facility]) injection Administer at 0 and 2-6 months for 2 total doses.  To be administered by pharmacy.  . [DISCONTINUED] rivaroxaban (XARELTO) 20 MG TABS tablet Take 1 tablet (20 mg total) by mouth daily with supper.     Allergies:   Penicillins   Social History   Socioeconomic History  . Marital status: Married    Spouse name: Not on file  . Number of children: 2  . Years of education: Not on  file  . Highest education level: Not on file  Occupational History    Employer: [employer]    Comment: Regulatory affairs officer  Tobacco Use  . Smoking status: Never Smoker  . Smokeless tobacco: Never Used  Substance and Sexual Activity  . Alcohol use: Yes    Comment: Occasional  . Drug use: Not on file  . Sexual activity: Not on file  Other Topics Concern  . Not on file  Social History Narrative  . Not on file   Social Determinants of Health   Financial Resource Strain: Not on file  Food Insecurity: Not on file  Transportation Needs: Not on file  Physical Activity:  Not on file  Stress: Not on file  Social Connections: Not on file     Family History: The patient's family history includes CAD in his father.  ROS:   Please see the history of present illness. (+)Lightheadedness (+)SOB (+)Difficulty sleeping    All other systems reviewed and are negative.  EKGs/Labs/Other Studies Reviewed:    The following studies were reviewed today:   EKG:   4/22- Normal sinus rhythm with frequent PACs , Left axis deviation, Poor R progression, rate 81  3/22- SVT, rate 149.  Repeat EKG showed sinus tachycardia with PACs/PVCs and run of SVT, rate 129   Recent Labs: 12/27/2019: ALT 32 05/22/2020: Magnesium 2.1 06/30/2020: BUN 18; Creatinine, Ser 1.29; Hemoglobin 16.4; Platelets 247; Potassium 4.5; Sodium 147; TSH 1.740  Recent Lipid Panel    Component Value Date/Time   CHOL 163 12/27/2019 1001   TRIG 95 12/27/2019 1001   HDL 54 12/27/2019 1001   CHOLHDL 3.0 12/27/2019 1001   LDLCALC 90 12/27/2019 1001    Physical Exam:    VS:  BP (!) 140/92 (BP Location: Left Arm, Patient Position: Sitting, Cuff Size: Large)   Pulse 81   Ht 5\' 11"  (1.803 m)   Wt 247 lb (112 kg)   BMI 34.45 kg/m     Wt Readings from Last 3 Encounters:  06/30/20 247 lb (112 kg)  06/12/20 260 lb (117.9 kg)  05/29/20 256 lb 3.2 oz (116.2 kg)     GEN: Well nourished, well developed in no acute distress HEENT: Normal NECK: No JVD; No carotid bruits CARDIAC: tachycardic, irregular, no murmurs, rubs, gallops RESPIRATORY:  Clear to auscultation without rales, wheezing or rhonchi  ABDOMEN: Soft, non-tender, non-distended MUSCULOSKELETAL:  No edema; No deformity  SKIN: Warm and dry NEUROLOGIC:  Alert and oriented x 3 PSYCHIATRIC:  Normal affect   ASSESSMENT:    1. Acute combined systolic and diastolic heart failure ([place])   2. PAF (paroxysmal atrial fibrillation) ([place])   3. SVT (supraventricular tachycardia) ([place])   4. Essential hypertension   5. Hyperlipidemia, unspecified  hyperlipidemia type    PLAN:    Acute combined systolic diastolic heart failure: Echocardiogram 06/26/2020 showed LVEF 35%, global hypokinesis, mild LV dilatation, grade 2 diastolic dysfunction, mild RV systolic dysfunction, mild to moderate left atrial dilatation, mild to moderate MR, mild AI.   -Recommend RHC/LHC to work-up for cardiomyopathy.  Risks and benefits of cardiac catheterization have been discussed with the patient.  These include bleeding, infection, kidney damage, stroke, heart attack, death.  The patient understands these risks and is willing to proceed. -If ischemia work-up unremarkable, cardiomyopathy could be tachycardia induced from frequent SVT/A. fib as below.  Plan EP follow-up to consider antiarrhythmic versus ablation pending results of cath -Start losartan 25 mg daily -Start carvedilol 3.125 mg twice daily -  Continue Lasix 20 mg daily -Check BMP, CBC, TSH  SVT: Intermittent runs of SVT to 170s during recent ED visit on 05/22/2020.  Was started on metoprolol 25 mg twice daily but states he only took once because thought it may have worsened his hip pain.  Zio patch x5 days on 06/12/2020 showed 20% A. fib burden, average rate 134 with longest episode lasting 5.5 hours with average rate 155 bpm.  There were 1089 episodes of SVT, longest lasting 14 minutes with average rate 176 bpm.  Frequent PACs (6.2%) along with occasional supraventricular couplets (2.7%) and triplets (1.1%). -Plan EP follow-up to consider antiarrhythmic versus ablation pending results of cath as above  Paroxysmal atrial fibrillation: Zio patch x5 days on 06/12/2020 showed 20% AF burden, average rate 134 with longest episode lasting 5.5 hours with average rate 155 bpm -Was previously prescribed metoprolol but stopped taking.  Will start carvedilol 3.125 mg twice daily as above, titrate as tolerated -Reported feeling lightheaded when started Eliquis, reported same symptoms with Xarelto.  Suspect his  lightheadedness is from his heart failure and not related to anticoagulation.  He is agreeable to restarting anticoagulation, will restart Eliquis 5 mg twice daily  Hyperlipidemia: LDL 90 on 12/27/2019, was on Repatha but reports he stopped taking.  Encouraged him to restart Repatha.  Will follow up results of catheterization to guide how aggressive to be lowering cholesterol.  Hypertension: Was on amlodipine 2.5 mg daily but stopped taking.  Adding losartan and carvedilol for heart failure as above  RTC in 1 month   Medication Adjustments/Labs and Tests Ordered: Current medicines are reviewed at length with the patient today.  Concerns regarding medicines are outlined above.  Orders Placed This Encounter  Procedures  . Basic metabolic panel  . CBC w/Diff/Platelet  . TSH  . EKG 12-Lead   Meds ordered this encounter  Medications  . apixaban (ELIQUIS) 5 MG TABS tablet    Sig: Take 1 tablet (5 mg total) by mouth 2 (two) times daily.    Dispense:  180 tablet    Refill:  3  . losartan (COZAAR) 25 MG tablet    Sig: Take 1 tablet (25 mg total) by mouth daily.    Dispense:  90 tablet    Refill:  3  . carvedilol (COREG) 3.125 MG tablet    Sig: Take 1 tablet (3.125 mg total) by mouth 2 (two) times daily.    Dispense:  180 tablet    Refill:  3    Patient Instructions  Medication Instructions:  Stop Xarelto Start Eliquis 5 mg twice a day Start Losartan 25 mg daily Start Carvedilol 3.125 mg twice a day Continue all other medications *If you need a refill on your cardiac medications before your next appointment, please call your pharmacy*   Lab Work: Bmet,cbc,tsh today    Covid Test to be done at [facility]  Monday 07/03/20 at 12:55 pm   Quarantine until after cath   Testing/Procedures: Cardiac cath scheduled Wed 4/6   Follow instructions below   Follow-Up: At [facility], you and your health needs are our priority.  As part of our continuing mission  to provide you with exceptional heart care, we have created designated Provider Care Teams.  These Care Teams include your primary Cardiologist (physician) and Advanced Practice Providers (APPs -  Physician Assistants and Nurse Practitioners) who all work together to provide you with the care you need, when you need it.  We recommend signing  up for the patient portal called "MyChart".  Sign up information is provided on this After Visit Summary.  MyChart is used to connect with patients for Virtual Visits (Telemedicine).  Patients are able to view lab/test results, encounter notes, upcoming appointments, etc.  Non-urgent messages can be sent to your provider as well.   To learn more about what you can do with MyChart, go to [email or web address].    Your next appointment:  Tues  5/3 at 11:40 am   The format for your next appointment: Office    Provider:  [place] [address] Dept: [phone] Loc: [phone]  Shelden Raborn  06/30/2020  You are scheduled for a Cardiac Cath on Wednesday 07/05/20, with Dr.Jordan.  1. Please arrive at the [facility] (Main Entrance A) at [facility]: [address] [place], [place] [address] at 6:30 am (This time is two hours before your procedure to ensure your preparation). Free valet parking service is available.   Special note: Every effort is made to have your procedure done on time. Please understand that emergencies sometimes delay scheduled procedures.  2. Diet: Do not eat solid foods after midnight.  The patient may have clear liquids until 5am upon the day of the procedure.  3. Labs: You will need to have blood drawn on Friday at [place] office  . You do not need to be fasting.  Covid Test  Scheduled Monday 07/04/20 at 12:55 pm at [facility] until after  cath  4. Medication instructions in preparation for your procedure:   Hold Eliquis 2 days before cath  ( Last dose Sunday 4/3 )    Hold Furosemide morning of cath    On the morning of your procedure, take Aspirin 81 mg and any morning medicines NOT listed above.  You may use sips of water.  5. Plan for one night stay--bring personal belongings. 6. Bring a current list of your medications and current insurance cards. 7. You MUST have a responsible person to drive you home. 8. Someone MUST be with you the first 24 hours after you arrive home or your discharge will be delayed. 9. Please wear clothes that are easy to get on and off and wear slip-on shoes.  Thank you for allowing [place] to care for you!   -- [place] [facility]       I,Alexis Bryant,acting as a scribe for Donato Heinz, MD.,have documented all relevant documentation on the behalf of Donato Heinz, MD,as directed by  Donato Heinz, MD while in the presence of Donato Heinz, MD.  Signed, Donato Heinz, MD  07/01/2020 3:26 PM    [place]

## 2020-06-30 NOTE — H&P (View-Only) (Signed)
Cardiology Office Note:    Date:  07/01/2020   ID:  Robert Guerrero, DOB 06/05/1950, MRN [id number]  PCP:  Silverio Decamp, MD  Cardiologist:  No primary care provider on file.  Electrophysiologist:  None   Referring MD: Silverio Decamp,*   Chief Complaint  Patient presents with  . New Patient (Initial Visit)  . Headache  . Shortness of Breath    History of Present Illness:    Robert Guerrero is a 70 y.o. male with a hx of SVT, hypertension, hyperlipidemia who presents for follow-up.  He was initially seen as an ED follow-up for SVT on 05/29/2020.  He was seen in the ED on 05/22/2020.  He presented to the ED with shortness of breath and palpitations.  Was noted to have intermittent runs of SVT to the 170s.  He reported it had significant caffeine intake and was not keeping up with oral hydration.  He was started on metoprolol 25 mg twice daily and discharge.  He previously followed with Dr. Stanford Breed for his SVT, last seen in 2015.  He was referred to Dr. Lovena Le to consider ablation.  He did not tolerate beta-blockers at that time due to fatigue and did not tolerate calcium channel blockers due to constipation.  Echocardiogram 06/26/2020 showed LVEF 35%, global hypokinesis, mild LV dilatation, grade 2 diastolic dysfunction, mild RV systolic dysfunction, mild to moderate left atrial dilatation, mild to moderate MR, mild AI.  Zio patch x5 days on 06/12/2020 showed 20% A. fib burden, average rate 134 with longest episode lasting 5.5 hours with average rate 155 bpm.  There were 1089 episodes of SVT, longest lasting 14 minutes with average rate 176 bpm.  Frequent PACs (6.2%) along with occasional supraventricular couplets (2.7%) and triplets (1.1%).  He is accompanied by his wife. Since his last clinic visit, he has been complaining of shortness of breath that has worsened over the past 6 weeks.  It has been affecting his sleep. He has no chest pain but he does have palpitations. He reports  being able to tolerate the palpitation more than his shortness of breath. He has lightheadedness. He started Lasix 2 days ago, however it makes his feel like he is "drunk" and fatigue after. He stopped taking his Repatha, Eliquis, and Amlodipine.   Past Medical History:  Diagnosis Date  . Hyperlipidemia   . Hypertension   . Osteoarthritis of right knee   . Right lumbar radiculitis   . SVT (supraventricular tachycardia) ([place])     History reviewed. No pertinent surgical history.  Current Medications: Current Meds  Medication Sig  . apixaban (ELIQUIS) 5 MG TABS tablet Take 1 tablet (5 mg total) by mouth 2 (two) times daily.  . carvedilol (COREG) 3.125 MG tablet Take 1 tablet (3.125 mg total) by mouth 2 (two) times daily.  . furosemide (LASIX) 20 MG tablet Take 1 tablet (20 mg total) by mouth daily.  Marland Kitchen losartan (COZAAR) 25 MG tablet Take 1 tablet (25 mg total) by mouth daily.  Marland Kitchen Zoster Vaccine Adjuvanted [employer]) injection Administer at 0 and 2-6 months for 2 total doses.  To be administered by pharmacy.  . [DISCONTINUED] rivaroxaban (XARELTO) 20 MG TABS tablet Take 1 tablet (20 mg total) by mouth daily with supper.     Allergies:   Penicillins   Social History   Socioeconomic History  . Marital status: Married    Spouse name: Not on file  . Number of children: 2  . Years of education: Not on  file  . Highest education level: Not on file  Occupational History    Employer: [employer]    Comment: Regulatory affairs officer  Tobacco Use  . Smoking status: Never Smoker  . Smokeless tobacco: Never Used  Substance and Sexual Activity  . Alcohol use: Yes    Comment: Occasional  . Drug use: Not on file  . Sexual activity: Not on file  Other Topics Concern  . Not on file  Social History Narrative  . Not on file   Social Determinants of Health   Financial Resource Strain: Not on file  Food Insecurity: Not on file  Transportation Needs: Not on file  Physical Activity:  Not on file  Stress: Not on file  Social Connections: Not on file     Family History: The patient's family history includes CAD in his father.  ROS:   Please see the history of present illness. (+)Lightheadedness (+)SOB (+)Difficulty sleeping    All other systems reviewed and are negative.  EKGs/Labs/Other Studies Reviewed:    The following studies were reviewed today:   EKG:   4/22- Normal sinus rhythm with frequent PACs , Left axis deviation, Poor R progression, rate 81  3/22- SVT, rate 149.  Repeat EKG showed sinus tachycardia with PACs/PVCs and run of SVT, rate 129   Recent Labs: 12/27/2019: ALT 32 05/22/2020: Magnesium 2.1 06/30/2020: BUN 18; Creatinine, Ser 1.29; Hemoglobin 16.4; Platelets 247; Potassium 4.5; Sodium 147; TSH 1.740  Recent Lipid Panel    Component Value Date/Time   CHOL 163 12/27/2019 1001   TRIG 95 12/27/2019 1001   HDL 54 12/27/2019 1001   CHOLHDL 3.0 12/27/2019 1001   LDLCALC 90 12/27/2019 1001    Physical Exam:    VS:  BP (!) 140/92 (BP Location: Left Arm, Patient Position: Sitting, Cuff Size: Large)   Pulse 81   Ht 5\' 11"  (1.803 m)   Wt 247 lb (112 kg)   BMI 34.45 kg/m     Wt Readings from Last 3 Encounters:  06/30/20 247 lb (112 kg)  06/12/20 260 lb (117.9 kg)  05/29/20 256 lb 3.2 oz (116.2 kg)     GEN: Well nourished, well developed in no acute distress HEENT: Normal NECK: No JVD; No carotid bruits CARDIAC: tachycardic, irregular, no murmurs, rubs, gallops RESPIRATORY:  Clear to auscultation without rales, wheezing or rhonchi  ABDOMEN: Soft, non-tender, non-distended MUSCULOSKELETAL:  No edema; No deformity  SKIN: Warm and dry NEUROLOGIC:  Alert and oriented x 3 PSYCHIATRIC:  Normal affect   ASSESSMENT:    1. Acute combined systolic and diastolic heart failure ([place])   2. PAF (paroxysmal atrial fibrillation) ([place])   3. SVT (supraventricular tachycardia) ([place])   4. Essential hypertension   5. Hyperlipidemia, unspecified  hyperlipidemia type    PLAN:    Acute combined systolic diastolic heart failure: Echocardiogram 06/26/2020 showed LVEF 35%, global hypokinesis, mild LV dilatation, grade 2 diastolic dysfunction, mild RV systolic dysfunction, mild to moderate left atrial dilatation, mild to moderate MR, mild AI.   -Recommend RHC/LHC to work-up for cardiomyopathy.  Risks and benefits of cardiac catheterization have been discussed with the patient.  These include bleeding, infection, kidney damage, stroke, heart attack, death.  The patient understands these risks and is willing to proceed. -If ischemia work-up unremarkable, cardiomyopathy could be tachycardia induced from frequent SVT/A. fib as below.  Plan EP follow-up to consider antiarrhythmic versus ablation pending results of cath -Start losartan 25 mg daily -Start carvedilol 3.125 mg twice daily -  Continue Lasix 20 mg daily -Check BMP, CBC, TSH  SVT: Intermittent runs of SVT to 170s during recent ED visit on 05/22/2020.  Was started on metoprolol 25 mg twice daily but states he only took once because thought it may have worsened his hip pain.  Zio patch x5 days on 06/12/2020 showed 20% A. fib burden, average rate 134 with longest episode lasting 5.5 hours with average rate 155 bpm.  There were 1089 episodes of SVT, longest lasting 14 minutes with average rate 176 bpm.  Frequent PACs (6.2%) along with occasional supraventricular couplets (2.7%) and triplets (1.1%). -Plan EP follow-up to consider antiarrhythmic versus ablation pending results of cath as above  Paroxysmal atrial fibrillation: Zio patch x5 days on 06/12/2020 showed 20% AF burden, average rate 134 with longest episode lasting 5.5 hours with average rate 155 bpm -Was previously prescribed metoprolol but stopped taking.  Will start carvedilol 3.125 mg twice daily as above, titrate as tolerated -Reported feeling lightheaded when started Eliquis, reported same symptoms with Xarelto.  Suspect his  lightheadedness is from his heart failure and not related to anticoagulation.  He is agreeable to restarting anticoagulation, will restart Eliquis 5 mg twice daily  Hyperlipidemia: LDL 90 on 12/27/2019, was on Repatha but reports he stopped taking.  Encouraged him to restart Repatha.  Will follow up results of catheterization to guide how aggressive to be lowering cholesterol.  Hypertension: Was on amlodipine 2.5 mg daily but stopped taking.  Adding losartan and carvedilol for heart failure as above  RTC in 1 month   Medication Adjustments/Labs and Tests Ordered: Current medicines are reviewed at length with the patient today.  Concerns regarding medicines are outlined above.  Orders Placed This Encounter  Procedures  . Basic metabolic panel  . CBC w/Diff/Platelet  . TSH  . EKG 12-Lead   Meds ordered this encounter  Medications  . apixaban (ELIQUIS) 5 MG TABS tablet    Sig: Take 1 tablet (5 mg total) by mouth 2 (two) times daily.    Dispense:  180 tablet    Refill:  3  . losartan (COZAAR) 25 MG tablet    Sig: Take 1 tablet (25 mg total) by mouth daily.    Dispense:  90 tablet    Refill:  3  . carvedilol (COREG) 3.125 MG tablet    Sig: Take 1 tablet (3.125 mg total) by mouth 2 (two) times daily.    Dispense:  180 tablet    Refill:  3    Patient Instructions  Medication Instructions:  Stop Xarelto Start Eliquis 5 mg twice a day Start Losartan 25 mg daily Start Carvedilol 3.125 mg twice a day Continue all other medications *If you need a refill on your cardiac medications before your next appointment, please call your pharmacy*   Lab Work: Bmet,cbc,tsh today    Covid Test to be done at [facility]  Monday 07/03/20 at 12:55 pm   Quarantine until after cath   Testing/Procedures: Cardiac cath scheduled Wed 4/6   Follow instructions below   Follow-Up: At [facility], you and your health needs are our priority.  As part of our continuing mission  to provide you with exceptional heart care, we have created designated Provider Care Teams.  These Care Teams include your primary Cardiologist (physician) and Advanced Practice Providers (APPs -  Physician Assistants and Nurse Practitioners) who all work together to provide you with the care you need, when you need it.  We recommend signing  up for the patient portal called "MyChart".  Sign up information is provided on this After Visit Summary.  MyChart is used to connect with patients for Virtual Visits (Telemedicine).  Patients are able to view lab/test results, encounter notes, upcoming appointments, etc.  Non-urgent messages can be sent to your provider as well.   To learn more about what you can do with MyChart, go to [email or web address].    Your next appointment:  Tues  5/3 at 11:40 am   The format for your next appointment: Office    Provider:  Joanna Peconic [place] [address] Dept: [phone] Loc: [phone]  Robert Guerrero  06/30/2020  You are scheduled for a Cardiac Cath on Wednesday 07/05/20, with Dr.Jordan.  1. Please arrive at the [facility] (Main Entrance A) at [facility]: [address] [place], [place] [address] at 6:30 am (This time is two hours before your procedure to ensure your preparation). Free valet parking service is available.   Special note: Every effort is made to have your procedure done on time. Please understand that emergencies sometimes delay scheduled procedures.  2. Diet: Do not eat solid foods after midnight.  The patient may have clear liquids until 5am upon the day of the procedure.  3. Labs: You will need to have blood drawn on Friday at [place] office  . You do not need to be fasting.  Covid Test  Scheduled Monday 07/04/20 at 12:55 pm at [facility] until after  cath  4. Medication instructions in preparation for your procedure:   Hold Eliquis 2 days before cath  ( Last dose Sunday 4/3 )    Hold Furosemide morning of cath    On the morning of your procedure, take Aspirin 81 mg and any morning medicines NOT listed above.  You may use sips of water.  5. Plan for one night stay--bring personal belongings. 6. Bring a current list of your medications and current insurance cards. 7. You MUST have a responsible person to drive you home. 8. Someone MUST be with you the first 24 hours after you arrive home or your discharge will be delayed. 9. Please wear clothes that are easy to get on and off and wear slip-on shoes.  Thank you for allowing [place] to care for you!   -- [facility]       I,Alexis Bryant,acting as a scribe for Donato Heinz, MD.,have documented all relevant documentation on the behalf of Donato Heinz, MD,as directed by  Donato Heinz, MD while in the presence of Donato Heinz, MD.  Signed, Donato Heinz, MD  07/01/2020 3:26 PM    [place]

## 2020-07-01 LAB — CBC WITH DIFFERENTIAL/PLATELET
Basophils Absolute: 0 10*3/uL (ref 0.0–0.2)
Basos: 1 %
EOS (ABSOLUTE): 0.2 10*3/uL (ref 0.0–0.4)
Eos: 2 %
Hematocrit: 49.1 % (ref 37.5–51.0)
Hemoglobin: 16.4 g/dL (ref 13.0–17.7)
Immature Grans (Abs): 0 10*3/uL (ref 0.0–0.1)
Immature Granulocytes: 0 %
Lymphocytes Absolute: 1.8 10*3/uL (ref 0.7–3.1)
Lymphs: 29 %
MCH: 31 pg (ref 26.6–33.0)
MCHC: 33.4 g/dL (ref 31.5–35.7)
MCV: 93 fL (ref 79–97)
Monocytes Absolute: 0.9 10*3/uL (ref 0.1–0.9)
Monocytes: 14 %
Neutrophils Absolute: 3.4 10*3/uL (ref 1.4–7.0)
Neutrophils: 54 %
Platelets: 247 10*3/uL (ref 150–450)
RBC: 5.29 x10E6/uL (ref 4.14–5.80)
RDW: 13.6 % (ref 11.6–15.4)
WBC: 6.2 10*3/uL (ref 3.4–10.8)

## 2020-07-01 LAB — BASIC METABOLIC PANEL
BUN/Creatinine Ratio: 14 (ref 10–24)
BUN: 18 mg/dL (ref 8–27)
CO2: 19 mmol/L — ABNORMAL LOW (ref 20–29)
Calcium: 10.8 mg/dL — ABNORMAL HIGH (ref 8.6–10.2)
Chloride: 110 mmol/L — ABNORMAL HIGH (ref 96–106)
Creatinine, Ser: 1.29 mg/dL — ABNORMAL HIGH (ref 0.76–1.27)
Glucose: 86 mg/dL (ref 65–99)
Potassium: 4.5 mmol/L (ref 3.5–5.2)
Sodium: 147 mmol/L — ABNORMAL HIGH (ref 134–144)
eGFR: 60 mL/min/{1.73_m2} (ref >59)

## 2020-07-01 LAB — TSH: TSH: 1.74 u[IU]/mL (ref 0.450–4.500)

## 2020-07-03 ENCOUNTER — Other Ambulatory Visit (HOSPITAL_COMMUNITY)
Admission: RE | Admit: 2020-07-03 | Discharge: 2020-07-03 | Disposition: A | Discharge status: Home / Self Care | Payer: Medicare HMO | Source: Ambulatory Visit | Attending: Cardiology | Admitting: Cardiology

## 2020-07-03 DIAGNOSIS — Z20822 Contact with and (suspected) exposure to covid-19: Secondary | ICD-10-CM | POA: Diagnosis not present

## 2020-07-03 DIAGNOSIS — Z01812 Encounter for preprocedural laboratory examination: Secondary | ICD-10-CM | POA: Insufficient documentation

## 2020-07-04 ENCOUNTER — Other Ambulatory Visit: Payer: Self-pay | Admitting: *Deleted

## 2020-07-04 ENCOUNTER — Telehealth: Payer: Self-pay | Admitting: *Deleted

## 2020-07-04 ENCOUNTER — Ambulatory Visit: Payer: Medicare HMO | Admitting: Cardiology

## 2020-07-04 LAB — SARS CORONAVIRUS 2 (TAT 6-24 HRS): SARS Coronavirus 2: NEGATIVE

## 2020-07-04 MED ORDER — FUROSEMIDE 20 MG PO TABS: 20.0000 mg | ORAL_TABLET | Freq: Every day | ORAL | 3 refills | Status: DC | PRN

## 2020-07-04 NOTE — Telephone Encounter (Signed)
Pt contacted pre-catheterization scheduled at [facility] for: Wednesday July 05, 2020 8:30 AM Verified arrival time and place: [place] [facility]) at: 6:30 AM   No solid food after midnight prior to cath, clear liquids until 5 AM day of procedure.  Hold: Xarelto-pt states last dose 07/01/20 until post procedure>pt reports plan is to start Eliquis instead of Xarelto post procedure Lasix-AM of procedure  Except hold medications AM meds can be  taken pre-cath with sips of water including: ASA 81 mg   Confirmed patient has responsible adult to drive home post procedure and be with patient first 24 hours after arriving home: yes  You are allowed ONE visitor in the waiting room during the time you are at the hospital for your procedure. Both you and your visitor must wear a mask once you enter the hospital.   Reviewed procedure/mask/visitor instructions with patient.

## 2020-07-05 ENCOUNTER — Encounter (HOSPITAL_COMMUNITY)
Admission: RE | Disposition: A | Discharge status: Home / Self Care | Payer: Self-pay | Source: Home / Self Care | Attending: Cardiology

## 2020-07-05 ENCOUNTER — Encounter (HOSPITAL_COMMUNITY): Payer: Self-pay | Admitting: Cardiology

## 2020-07-05 ENCOUNTER — Other Ambulatory Visit: Payer: Self-pay

## 2020-07-05 ENCOUNTER — Ambulatory Visit (HOSPITAL_COMMUNITY)
Admission: RE | Admit: 2020-07-05 | Discharge: 2020-07-05 | Disposition: A | Discharge status: Home / Self Care | Payer: Medicare HMO | Attending: Cardiology | Admitting: Cardiology

## 2020-07-05 DIAGNOSIS — Z7901 Long term (current) use of anticoagulants: Secondary | ICD-10-CM | POA: Diagnosis not present

## 2020-07-05 DIAGNOSIS — I471 Supraventricular tachycardia, unspecified: Secondary | ICD-10-CM | POA: Diagnosis present

## 2020-07-05 DIAGNOSIS — I10 Essential (primary) hypertension: Secondary | ICD-10-CM | POA: Diagnosis present

## 2020-07-05 DIAGNOSIS — I110 Hypertensive heart disease with heart failure: Secondary | ICD-10-CM | POA: Diagnosis not present

## 2020-07-05 DIAGNOSIS — I5041 Acute combined systolic (congestive) and diastolic (congestive) heart failure: Secondary | ICD-10-CM | POA: Insufficient documentation

## 2020-07-05 DIAGNOSIS — I480 Paroxysmal atrial fibrillation: Secondary | ICD-10-CM | POA: Insufficient documentation

## 2020-07-05 DIAGNOSIS — Z880 Allergy status to penicillin: Secondary | ICD-10-CM | POA: Diagnosis not present

## 2020-07-05 DIAGNOSIS — I2510 Atherosclerotic heart disease of native coronary artery without angina pectoris: Secondary | ICD-10-CM | POA: Diagnosis not present

## 2020-07-05 DIAGNOSIS — E785 Hyperlipidemia, unspecified: Secondary | ICD-10-CM | POA: Diagnosis not present

## 2020-07-05 DIAGNOSIS — I5043 Acute on chronic combined systolic (congestive) and diastolic (congestive) heart failure: Secondary | ICD-10-CM | POA: Diagnosis not present

## 2020-07-05 DIAGNOSIS — Z79899 Other long term (current) drug therapy: Secondary | ICD-10-CM | POA: Diagnosis not present

## 2020-07-05 HISTORY — PX: RIGHT/LEFT HEART CATH AND CORONARY ANGIOGRAPHY: CATH118266

## 2020-07-05 HISTORY — DX: Acute on chronic combined systolic (congestive) and diastolic (congestive) heart failure: I50.43

## 2020-07-05 LAB — POCT I-STAT 7, (LYTES, BLD GAS, ICA,H+H)
Acid-base deficit: 2 mmol/L (ref 0.0–2.0)
Bicarbonate: 22.9 mmol/L (ref 20.0–28.0)
Calcium, Ion: 1.18 mmol/L (ref 1.15–1.40)
HCT: 45 % (ref 39.0–52.0)
Hemoglobin: 15.3 g/dL (ref 13.0–17.0)
O2 Saturation: 97 %
Potassium: 3.8 mmol/L (ref 3.5–5.1)
Sodium: 142 mmol/L (ref 135–145)
TCO2: 24 mmol/L (ref 22–32)
pCO2 arterial: 38.7 mmHg (ref 32.0–48.0)
pH, Arterial: 7.38 (ref 7.350–7.450)
pO2, Arterial: 92 mmHg (ref 83.0–108.0)

## 2020-07-05 LAB — POCT I-STAT EG7
Acid-base deficit: 1 mmol/L (ref 0.0–2.0)
Bicarbonate: 24.6 mmol/L (ref 20.0–28.0)
Calcium, Ion: 1.23 mmol/L (ref 1.15–1.40)
HCT: 46 % (ref 39.0–52.0)
Hemoglobin: 15.6 g/dL (ref 13.0–17.0)
O2 Saturation: 72 %
Potassium: 3.9 mmol/L (ref 3.5–5.1)
Sodium: 142 mmol/L (ref 135–145)
TCO2: 26 mmol/L (ref 22–32)
pCO2, Ven: 41.6 mmHg — ABNORMAL LOW (ref 44.0–60.0)
pH, Ven: 7.38 (ref 7.250–7.430)
pO2, Ven: 39 mmHg (ref 32.0–45.0)

## 2020-07-05 SURGERY — RIGHT/LEFT HEART CATH AND CORONARY ANGIOGRAPHY
Anesthesia: LOCAL

## 2020-07-05 MED ORDER — ACETAMINOPHEN 325 MG PO TABS: 650.0000 mg | ORAL_TABLET | ORAL | Status: DC | PRN

## 2020-07-05 MED ORDER — METOPROLOL TARTRATE 5 MG/5ML IV SOLN
INTRAVENOUS | Status: DC | PRN
  Administered 2020-07-05: 5 mg via INTRAVENOUS

## 2020-07-05 MED ORDER — MIDAZOLAM HCL 2 MG/2ML IJ SOLN
INTRAMUSCULAR | Status: AC
  Filled 2020-07-05: qty 2

## 2020-07-05 MED ORDER — ONDANSETRON HCL 4 MG/2ML IJ SOLN: 4.0000 mg | Freq: Four times a day (QID) | INTRAMUSCULAR | Status: DC | PRN

## 2020-07-05 MED ORDER — HEPARIN SODIUM (PORCINE) 1000 UNIT/ML IJ SOLN
INTRAMUSCULAR | Status: DC | PRN
  Administered 2020-07-05: 5000 [IU] via INTRAVENOUS

## 2020-07-05 MED ORDER — SODIUM CHLORIDE 0.9% FLUSH: 3.0000 mL | INTRAVENOUS | Status: DC | PRN

## 2020-07-05 MED ORDER — VERAPAMIL HCL 2.5 MG/ML IV SOLN
INTRAVENOUS | Status: AC
  Filled 2020-07-05: qty 2

## 2020-07-05 MED ORDER — FENTANYL CITRATE (PF) 100 MCG/2ML IJ SOLN
INTRAMUSCULAR | Status: DC | PRN
  Administered 2020-07-05: 25 ug via INTRAVENOUS

## 2020-07-05 MED ORDER — SODIUM CHLORIDE 0.9% FLUSH: 3.0000 mL | Freq: Two times a day (BID) | INTRAVENOUS | Status: DC

## 2020-07-05 MED ORDER — SODIUM CHLORIDE 0.9 % IV SOLN: 250.0000 mL | INTRAVENOUS | Status: DC | PRN

## 2020-07-05 MED ORDER — MIDAZOLAM HCL 2 MG/2ML IJ SOLN
INTRAMUSCULAR | Status: DC | PRN
  Administered 2020-07-05: 1 mg via INTRAVENOUS

## 2020-07-05 MED ORDER — HEPARIN (PORCINE) IN NACL 1000-0.9 UT/500ML-% IV SOLN
INTRAVENOUS | Status: DC | PRN
  Administered 2020-07-05 (×2): 500 mL

## 2020-07-05 MED ORDER — SODIUM CHLORIDE 0.9 % IV SOLN: INTRAVENOUS | Status: DC

## 2020-07-05 MED ORDER — IOHEXOL 350 MG/ML SOLN
INTRAVENOUS | Status: DC | PRN
  Administered 2020-07-05: 75 mL

## 2020-07-05 MED ORDER — SODIUM CHLORIDE 0.9 % WEIGHT BASED INFUSION: 1.0000 mL/kg/h | INTRAVENOUS | Status: AC

## 2020-07-05 MED ORDER — FENTANYL CITRATE (PF) 100 MCG/2ML IJ SOLN
INTRAMUSCULAR | Status: AC
  Filled 2020-07-05: qty 2

## 2020-07-05 MED ORDER — HEPARIN (PORCINE) IN NACL 1000-0.9 UT/500ML-% IV SOLN
INTRAVENOUS | Status: AC
  Filled 2020-07-05: qty 1000

## 2020-07-05 MED ORDER — METOPROLOL TARTRATE 5 MG/5ML IV SOLN
INTRAVENOUS | Status: AC
  Filled 2020-07-05: qty 5

## 2020-07-05 MED ORDER — LIDOCAINE HCL (PF) 1 % IJ SOLN
INTRAMUSCULAR | Status: DC | PRN
  Administered 2020-07-05 (×2): 2 mL

## 2020-07-05 MED ORDER — ASPIRIN 81 MG PO CHEW: 81.0000 mg | CHEWABLE_TABLET | Freq: Once | ORAL | Status: DC

## 2020-07-05 MED ORDER — VERAPAMIL HCL 2.5 MG/ML IV SOLN
INTRAVENOUS | Status: DC | PRN
  Administered 2020-07-05: 10 mL via INTRA_ARTERIAL

## 2020-07-05 MED ORDER — LIDOCAINE HCL (PF) 1 % IJ SOLN
INTRAMUSCULAR | Status: AC
  Filled 2020-07-05: qty 30

## 2020-07-05 SURGICAL SUPPLY — 11 items
CATH 5FR JL3.5 JR4 ANG PIG MP (CATHETERS) ×2 IMPLANT
CATH SWAN GANZ 7F STRAIGHT (CATHETERS) ×2 IMPLANT
DEVICE RAD COMP TR BAND LRG (VASCULAR PRODUCTS) ×2 IMPLANT
GLIDESHEATH SLEND SS 6F .021 (SHEATH) ×2 IMPLANT
GLIDESHEATH SLENDER 7FR .021G (SHEATH) ×2 IMPLANT
GUIDEWIRE INQWIRE 1.5J.035X260 (WIRE) ×1 IMPLANT
INQWIRE 1.5J .035X260CM (WIRE) ×2
KIT HEART LEFT (KITS) ×2 IMPLANT
PACK CARDIAC CATHETERIZATION (CUSTOM PROCEDURE TRAY) ×2 IMPLANT
TRANSDUCER W/STOPCOCK (MISCELLANEOUS) ×2 IMPLANT
TUBING CIL FLEX 10 FLL-RA (TUBING) ×2 IMPLANT

## 2020-07-05 NOTE — Discharge Instructions (Signed)
Radial Site Care  This sheet gives you information about how to care for yourself after your procedure. Your health care provider may also give you more specific instructions. If you have problems or questions, contact your health care provider. What can I expect after the procedure? After the procedure, it is common to have:  Bruising and tenderness at the catheter insertion area. Follow these instructions at home: Medicines  Take over-the-counter and prescription medicines only as told by your health care provider. Insertion site care  Follow instructions from your health care provider about how to take care of your insertion site. Make sure you: ? Wash your hands with soap and water before you change your bandage (dressing). If soap and water are not available, use hand sanitizer. ? Change your dressing as told by your health care provider. ? Leave stitches (sutures), skin glue, or adhesive strips in place. These skin closures may need to stay in place for 2 weeks or longer. If adhesive strip edges start to loosen and curl up, you may trim the loose edges. Do not remove adhesive strips completely unless your health care provider tells you to do that.  Check your insertion site every day for signs of infection. Check for: ? Redness, swelling, or pain. ? Fluid or blood. ? Pus or a bad smell. ? Warmth.  Do not take baths, swim, or use a hot tub until your health care provider approves.  You may shower 24-48 hours after the procedure, or as directed by your health care provider. ? Remove the dressing and gently wash the site with plain soap and water. ? Pat the area dry with a clean towel. ? Do not rub the site. That could cause bleeding.  Do not apply powder or lotion to the site. Activity  For 24 hours after the procedure, or as directed by your health care provider: ? Do not flex or bend the affected arm. ? Do not push or pull heavy objects with the affected arm. ? Do not drive  yourself home from the hospital or clinic. You may drive 24 hours after the procedure unless your health care provider tells you not to. ? Do not operate machinery or power tools.  Do not lift anything that is heavier than 10 lb (4.5 kg), or the limit that you are told, until your health care provider says that it is safe.  Ask your health care provider when it is okay to: ? Return to work or school. ? Resume usual physical activities or sports. ? Resume sexual activity.   General instructions  If the catheter site starts to bleed, raise your arm and put firm pressure on the site. If the bleeding does not stop, get help right away. This is a medical emergency.  If you went home on the same day as your procedure, a responsible adult should be with you for the first 24 hours after you arrive home.  Keep all follow-up visits as told by your health care provider. This is important. Contact a health care provider if:  You have a fever.  You have redness, swelling, or yellow drainage around your insertion site. Get help right away if:  You have unusual pain at the radial site.  The catheter insertion area swells very fast.  The insertion area is bleeding, and the bleeding does not stop when you hold steady pressure on the area.  Your arm or hand becomes pale, cool, tingly, or numb. These symptoms may represent a serious   problem that is an emergency. Do not wait to see if the symptoms will go away. Get medical help right away. Call your local emergency services (911 in the [place] Do not drive yourself to the hospital. Summary  After the procedure, it is common to have bruising and tenderness at the site.  Follow instructions from your health care provider about how to take care of your radial site wound. Check the wound every day for signs of infection.  Do not lift anything that is heavier than 10 lb (4.5 kg), or the limit that you are told, until your health care provider says that it  is safe. This information is not intended to replace advice given to you by your health care provider. Make sure you discuss any questions you have with your health care provider. Document Revised: 04/23/2017 Document Reviewed: 04/23/2017 Elsevier Patient Education  2021 Elsevier Inc.  

## 2020-07-05 NOTE — Interval H&P Note (Signed)
History and Physical Interval Note:  07/05/2020 8:28 AM  Robert Guerrero  has presented today for surgery, with the diagnosis of heart failure.  The various methods of treatment have been discussed with the patient and family. After consideration of risks, benefits and other options for treatment, the patient has consented to  Procedure(s): RIGHT/LEFT HEART CATH AND CORONARY ANGIOGRAPHY (N/A) as a surgical intervention.  The patient's history has been reviewed, patient examined, no change in status, stable for surgery.  I have reviewed the patient's chart and labs.  Questions were answered to the patient's satisfaction.   Cath Lab Visit (complete for each Cath Lab visit)  Clinical Evaluation Leading to the Procedure:   ACS: No.  Non-ACS:    Anginal Classification: CCS II  Anti-ischemic medical therapy: Minimal Therapy (1 class of medications)  Non-Invasive Test Results: No non-invasive testing performed  Prior CABG: No previous CABG        Collier Salina [facility] 07/05/2020 8:28 AM

## 2020-07-05 NOTE — Progress Notes (Signed)
Discharge instruction given to pt and wife.  Pt and wife was able to verbalize understanding

## 2020-07-06 ENCOUNTER — Encounter (HOSPITAL_COMMUNITY): Payer: Self-pay | Admitting: Cardiology

## 2020-07-07 NOTE — Progress Notes (Signed)
Can we get him back in with Dr Lovena Le for SVT ablation eval?

## 2020-07-11 ENCOUNTER — Telehealth: Payer: Self-pay | Admitting: Cardiology

## 2020-07-11 NOTE — Telephone Encounter (Signed)
Patient should not be on both carvedilol and metoprolol. These are both beta blockers. He should only be on carvedilol along with losartan. He can take his losartan at night if he would like. Carvedilol and Eliquis need to be taken twice a day. His dizziness/lightheadedness is most likely not from his medications. Most likely from his CHF and/or afib/SVT.  I will let Dr. Gilman Schmidt weigh in as well. Pt needs better blood pressure control but I suspect it could be from non-compliance

## 2020-07-11 NOTE — Telephone Encounter (Signed)
Pt c/o medication issue:  1. Name of Medication:  apixaban (ELIQUIS) 5 MG TABS tablet metoprolol tartrate (LOPRESSOR) 25 MG tablet carvedilol (COREG) 3.125 MG tablet  2. How are you currently taking this medication (dosage and times per day)? 1 tablet twice a day for each medication  3. Are you having a reaction (difficulty breathing--STAT)? no  4. What is your medication issue? Patient states the medications have been making him dizzy. He states he wants to know if he can take them only at night. He would like to know if he can take both doses at night or only take one. He states he is at work and and the dizziness makes him unstable. He states he is not having any other symptoms.

## 2020-07-11 NOTE — Telephone Encounter (Signed)
Spoke with pt on the phone regarding feelings of lightheaded and dizziness. Pt states that he is at work and it is making it hard for him to function. Pt would like to see what we can do medication wise to help with these feelings.  Pt is currently taking metoprolol tartrate 25mg  twice daily, carvedilol 3.125mg  twice daily and losartan 25mg  daily. Pt questions if medication can be taken at night vs twice daily. Pt does take him blood pressure during the day at states that his range is 140-145 over 80-90 and that his heart rate can vary from 60s and 70s all the way to 160bpm depending on what he is doing. Pt states that occasionally his pressure will be 299-242 systolic. Pt did ask about eliquis as well, explained to pt that this medication is very important and taking it twice a day is necessary. Also, explained to pt that dizziness is not usually a side effect of eliquis and we would not be able to change this medication. Pt does admit that he has a hard time staying hydrated at work, but that he is trying to do better.

## 2020-07-11 NOTE — Telephone Encounter (Signed)
Agree, not sure how he ended up taking both beta blockers, should only be on coreg not metoprolol. I agree, suspect symptoms from SVT and CHF.

## 2020-07-12 NOTE — Telephone Encounter (Signed)
Appointment with Dr. Lovena Le rescheduled for Friday 4/15.

## 2020-07-12 NOTE — Telephone Encounter (Signed)
Spoke to patient, advised to discontinue metoprolol, continue coreg as prescribed.    Advised will contact EP scheduling to see if appt with Dr. Lovena Le can be sooner if possible. Patient aware and verbalized understanding.

## 2020-07-12 NOTE — Addendum Note (Signed)
Addended by: Patria Mane A on: 07/12/2020 09:13 AM   Modules accepted: Orders

## 2020-07-14 ENCOUNTER — Other Ambulatory Visit: Payer: Self-pay

## 2020-07-14 ENCOUNTER — Encounter: Payer: Self-pay | Admitting: Internal Medicine

## 2020-07-14 ENCOUNTER — Ambulatory Visit: Payer: Medicare HMO | Admitting: Internal Medicine

## 2020-07-14 VITALS — BP 124/86 | HR 90 | Ht 71.0 in | Wt 253.0 lb

## 2020-07-14 DIAGNOSIS — I471 Supraventricular tachycardia: Secondary | ICD-10-CM | POA: Diagnosis not present

## 2020-07-14 MED ORDER — AMIODARONE HCL 200 MG PO TABS: 100.0000 mg | ORAL_TABLET | Freq: Two times a day (BID) | ORAL | 3 refills | Status: DC

## 2020-07-14 NOTE — Patient Instructions (Addendum)
Medication Instructions:  Your physician has recommended you make the following change in your medication:   1.  START taking amiodarone 200 mg-  Take 1/2 tablet (100 mg) by mouth TWICE a day  Labwork: None ordered.  Testing/Procedures: None ordered.  Follow-Up: Your physician wants you to follow-up in: 3 months with Cristopher Peru, MD    Any Other Special Instructions Will Be Listed Below (If Applicable).  If you need a refill on your cardiac medications before your next appointment, please call your pharmacy.   Amiodarone tablets What is this medicine? AMIODARONE (a MEE oh da rone) is an antiarrhythmic drug. It helps make your heart beat regularly. Because of the side effects caused by this medicine, it is only used when other medicines have not worked. It is usually used for heartbeat problems that may be life threatening. This medicine may be used for other purposes; ask your health care provider or pharmacist if you have questions. COMMON BRAND NAME(S): Cordarone, Pacerone What should I tell my health care provider before I take this medicine? They need to know if you have any of these conditions:  liver disease  lung disease  other heart problems  thyroid disease  an unusual or allergic reaction to amiodarone, iodine, other medicines, foods, dyes, or preservatives  pregnant or trying to get pregnant  breast-feeding How should I use this medicine? Take this medicine by mouth with a glass of water. Follow the directions on the prescription label. You can take this medicine with or without food. However, you should always take it the same way each time. Take your doses at regular intervals. Do not take your medicine more often than directed. Do not stop taking except on the advice of your doctor or health care professional. A special MedGuide will be given to you by the pharmacist with each prescription and refill. Be sure to read this information carefully each time. Talk  to your pediatrician regarding the use of this medicine in children. Special care may be needed. Overdosage: If you think you have taken too much of this medicine contact a poison control center or emergency room at once. NOTE: This medicine is only for you. Do not share this medicine with others. What if I miss a dose? If you miss a dose, take it as soon as you can. If it is almost time for your next dose, take only that dose. Do not take double or extra doses. What may interact with this medicine? Do not take this medicine with any of the following medications:  abarelix  apomorphine  arsenic trioxide  certain antibiotics like erythromycin, gemifloxacin, levofloxacin, pentamidine  certain medicines for depression like amoxapine, tricyclic antidepressants  certain medicines for fungal infections like fluconazole, itraconazole, ketoconazole, posaconazole, voriconazole  certain medicines for irregular heart beat like disopyramide, dronedarone, ibutilide, propafenone, sotalol  certain medicines for malaria like chloroquine, halofantrine  cisapride  droperidol  haloperidol  hawthorn  maprotiline  methadone  phenothiazines like chlorpromazine, mesoridazine, thioridazine  pimozide  ranolazine  red yeast rice  vardenafil This medicine may also interact with the following medications:  antiviral medicines for HIV or AIDS  certain medicines for blood pressure, heart disease, irregular heart beat  certain medicines for cholesterol like atorvastatin, cerivastatin, lovastatin, simvastatin  certain medicines for hepatitis C like sofosbuvir and ledipasvir; sofosbuvir  certain medicines for seizures like phenytoin  certain medicines for thyroid problems  certain medicines that treat or prevent blood clots like warfarin  cholestyramine  cimetidine  clopidogrel  cyclosporine  dextromethorphan  diuretics  dofetilide  fentanyl  general  anesthetics  grapefruit juice  lidocaine  loratadine  methotrexate  other medicines that prolong the QT interval (cause an abnormal heart rhythm)  procainamide  quinidine  rifabutin, rifampin, or rifapentine  St. John's Wort  trazodone  ziprasidone This list may not describe all possible interactions. Give your health care provider a list of all the medicines, herbs, non-prescription drugs, or dietary supplements you use. Also tell them if you smoke, drink alcohol, or use illegal drugs. Some items may interact with your medicine. What should I watch for while using this medicine? Your condition will be monitored closely when you first begin therapy. Often, this drug is first started in a hospital or other monitored health care setting. Once you are on maintenance therapy, visit your doctor or health care professional for regular checks on your progress. Because your condition and use of this medicine carry some risk, it is a good idea to carry an identification card, necklace or bracelet with details of your condition, medications, and doctor or health care professional. Dennis Bast may get drowsy or dizzy. Do not drive, use machinery, or do anything that needs mental alertness until you know how this medicine affects you. Do not stand or sit up quickly, especially if you are an older patient. This reduces the risk of dizzy or fainting spells. This medicine can make you more sensitive to the sun. Keep out of the sun. If you cannot avoid being in the sun, wear protective clothing and use sunscreen. Do not use sun lamps or tanning beds/booths. You should have regular eye exams before and during treatment. Call your doctor if you have blurred vision, see halos, or your eyes become sensitive to light. Your eyes may get dry. It may be helpful to use a lubricating eye solution or artificial tears solution. If you are going to have surgery or a procedure that requires contrast dyes, tell your doctor or  health care professional that you are taking this medicine. What side effects may I notice from receiving this medicine? Side effects that you should report to your doctor or health care professional as soon as possible:  allergic reactions like skin rash, itching or hives, swelling of the face, lips, or tongue  blue-gray coloring of the skin  blurred vision, seeing blue green halos, increased sensitivity of the eyes to light  breathing problems  chest pain  dark urine  fast, irregular heartbeat  feeling faint or light-headed  intolerance to heat or cold  nausea or vomiting  pain and swelling of the scrotum  pain, tingling, numbness in feet, hands  redness, blistering, peeling or loosening of the skin, including inside the mouth  spitting up blood  stomach pain  sweating  unusual or uncontrolled movements of body  unusually weak or tired  weight gain or loss  yellowing of the eyes or skin Side effects that usually do not require medical attention (report to your doctor or health care professional if they continue or are bothersome):  change in sex drive or performance  constipation  dizziness  headache  loss of appetite  trouble sleeping This list may not describe all possible side effects. Call your doctor for medical advice about side effects. You may report side effects to FDA at 1-800-FDA-1088. Where should I keep my medicine? Keep out of the reach of children. Store at room temperature between 20 and 25 degrees C (68 and 77 degrees F). Protect from light.  Keep container tightly closed. Throw away any unused medicine after the expiration date. NOTE: This sheet is a summary. It may not cover all possible information. If you have questions about this medicine, talk to your doctor, pharmacist, or health care provider.  2021 Elsevier/Gold Standard (2018-02-18 13:44:04)

## 2020-07-14 NOTE — Progress Notes (Signed)
HPI Robert Guerrero returns today to discuss SVT and catheter ablation. He has developed worsening symptoms. He has frequent PVC's and PAC's, and cardiac monitor he just wore demonstrated SVT and atrial fib with RVR. He has minimal palpitations. In addition he has LV dysfunction. He has been placed on medical therapy. He underwent cardiac cath and had non-obstructive CAD.  Allergies  Allergen Reactions  . Penicillins Hives     Current Outpatient Medications  Medication Sig Dispense Refill  . apixaban (ELIQUIS) 5 MG TABS tablet Take 1 tablet (5 mg total) by mouth 2 (two) times daily. 180 tablet 3  . APPLE CIDER VINEGAR PO Take 1 each by mouth daily. Gummy    . carvedilol (COREG) 3.125 MG tablet Take 1 tablet (3.125 mg total) by mouth 2 (two) times daily. 180 tablet 3  . furosemide (LASIX) 20 MG tablet Take 1 tablet (20 mg total) by mouth daily as needed (weight increase of 3 lbs overnight or 5 lbs in 1 week). 90 tablet 3  . losartan (COZAAR) 25 MG tablet Take 1 tablet (25 mg total) by mouth daily. 90 tablet 3  . Multiple Vitamins-Minerals (MULTIVITAMIN WITH MINERALS) tablet Take 1 tablet by mouth daily.    . Probiotic Product (PROBIOTIC DAILY PO) Take 1 capsule by mouth daily.     No current facility-administered medications for this visit.     Past Medical History:  Diagnosis Date  . Acute on chronic combined systolic and diastolic CHF (congestive heart failure) ([place]) 07/05/2020  . Hyperlipidemia   . Hypertension   . Osteoarthritis of right knee   . Right lumbar radiculitis   . SVT (supraventricular tachycardia) ([facility])     ROS:   All systems reviewed and negative except as noted in the HPI.   Past Surgical History:  Procedure Laterality Date  . RIGHT/LEFT HEART CATH AND CORONARY ANGIOGRAPHY N/A 07/05/2020   Procedure: RIGHT/LEFT HEART CATH AND CORONARY ANGIOGRAPHY;  Surgeon: Martinique, Peter M, MD;  Location: [place] [facility];  Service: Cardiovascular;  Laterality: N/A;      Family History  Problem Relation Age of Onset  . CAD Father        MI at age 70-60     Social History   Socioeconomic History  . Marital status: Married    Spouse name: Not on file  . Number of children: 2  . Years of education: Not on file  . Highest education level: Not on file  Occupational History    Employer: [employer]    Comment: Regulatory affairs officer  Tobacco Use  . Smoking status: Never Smoker  . Smokeless tobacco: Never Used  Substance and Sexual Activity  . Alcohol use: Yes    Comment: Occasional  . Drug use: Not on file  . Sexual activity: Not on file  Other Topics Concern  . Not on file  Social History Narrative  . Not on file   Social Determinants of Health   Financial Resource Strain: Not on file  Food Insecurity: Not on file  Transportation Needs: Not on file  Physical Activity: Not on file  Stress: Not on file  Social Connections: Not on file  Intimate Partner Violence: Not on file     BP 124/86   Pulse 90   Ht 5\' 11"  (1.803 Guerrero)   Wt 253 lb (114.8 kg)   SpO2 96%   BMI 35.29 kg/Guerrero   Physical Exam:  Well appearing NAD HEENT: Unremarkable Neck:  No JVD, no thyromegally Lymphatics:  No adenopathy Back:  No CVA tenderness Lungs:  Clear with no wheezes HEART:  Regular rate rhythm, no murmurs, no rubs, no clicks Abd:  soft, positive bowel sounds, no organomegally, no rebound, no guarding Ext:  2 plus pulses, no edema, no cyanosis, no clubbing Skin:  No rashes no nodules Neuro:  CN II through XII intact, motor grossly intact  EKG -  Reviewed. NSR with PAC's    Assess/Plan: 1. SVT - I discussed the treatment options with the patient. Because he has PVC's and PAC's and atrial fib in addition to the SVT. At this point the patient would like to proceed with ablation. However, his multiple different arrhythmias make ablation less than optimal. To try and treat all of his arrhythmias, I have recommended initiation of amiodarone.   2. Chronic systolic heart failure - His symptoms are class 2. He is on medical therapy. Hopefully with NSR and medical therapy he will improve his EF and symptoms.  Robert Overlie [name],MD

## 2020-07-31 DIAGNOSIS — E538 Deficiency of other specified B group vitamins: Secondary | ICD-10-CM

## 2020-07-31 DIAGNOSIS — R7989 Other specified abnormal findings of blood chemistry: Secondary | ICD-10-CM

## 2020-07-31 MED ORDER — "NEEDLE (DISP) 18G X 1"" MISC": 99 refills | Status: AC

## 2020-07-31 MED ORDER — "SYRINGE 25G X 1"" 3 ML MISC": 99 refills | Status: AC

## 2020-08-01 ENCOUNTER — Ambulatory Visit: Payer: Medicare HMO | Admitting: Cardiology

## 2020-08-03 ENCOUNTER — Ambulatory Visit: Payer: Medicare HMO | Admitting: Internal Medicine

## 2020-08-04 NOTE — Telephone Encounter (Signed)
BP looks okay, I do not see any low values, would continue current meds

## 2020-08-14 ENCOUNTER — Ambulatory Visit: Payer: Medicare HMO | Admitting: Cardiology

## 2020-08-18 ENCOUNTER — Encounter: Payer: Self-pay | Admitting: Cardiology

## 2020-08-18 ENCOUNTER — Other Ambulatory Visit: Payer: Self-pay

## 2020-08-18 ENCOUNTER — Ambulatory Visit: Payer: Medicare HMO | Admitting: Cardiology

## 2020-08-18 VITALS — Ht 71.0 in | Wt 260.0 lb

## 2020-08-18 DIAGNOSIS — E785 Hyperlipidemia, unspecified: Secondary | ICD-10-CM

## 2020-08-18 DIAGNOSIS — I5041 Acute combined systolic (congestive) and diastolic (congestive) heart failure: Secondary | ICD-10-CM

## 2020-08-18 DIAGNOSIS — I10 Essential (primary) hypertension: Secondary | ICD-10-CM

## 2020-08-18 DIAGNOSIS — I480 Paroxysmal atrial fibrillation: Secondary | ICD-10-CM

## 2020-08-18 DIAGNOSIS — I5042 Chronic combined systolic (congestive) and diastolic (congestive) heart failure: Secondary | ICD-10-CM

## 2020-08-18 DIAGNOSIS — I471 Supraventricular tachycardia: Secondary | ICD-10-CM

## 2020-08-18 NOTE — Progress Notes (Signed)
Cardiology Office Note:    Date:  08/20/2020   ID:  Robert Guerrero, DOB 12/27/1950, MRN [id number]  PCP:  Silverio Decamp, MD  Cardiologist:  None  Electrophysiologist:  None   Referring MD: Silverio Decamp,*   Chief Complaint  Patient presents with  . Congestive Heart Failure    History of Present Illness:    Robert Guerrero is a 70 y.o. male with a hx of SVT, hypertension, hyperlipidemia who presents for follow-up.  He was initially seen as an ED follow-up for SVT on 05/29/2020.  He was seen in the ED on 05/22/2020.  He presented to the ED with shortness of breath and palpitations.  Was noted to have intermittent runs of SVT to the 170s.  He reported it had significant caffeine intake and was not keeping up with oral hydration.  He was started on metoprolol 25 mg twice daily and discharge.  He previously followed with Dr. Stanford Breed for his SVT, last seen in 2015.  He was referred to Dr. Lovena Le to consider ablation.  He did not tolerate beta-blockers at that time due to fatigue and did not tolerate calcium channel blockers due to constipation.  Echocardiogram 06/26/2020 showed LVEF 35%, global hypokinesis, mild LV dilatation, grade 2 diastolic dysfunction, mild RV systolic dysfunction, mild to moderate left atrial dilatation, mild to moderate MR, mild AI.  Zio patch x5 days on 06/12/2020 showed 20% A. fib burden, average rate 134 with longest episode lasting 5.5 hours with average rate 155 bpm.  There were 1089 episodes of SVT, longest lasting 14 minutes with average rate 176 bpm.  Frequent PACs (6.2%) along with occasional supraventricular couplets (2.7%) and triplets (1.1%).  LHC/RHC on 07/05/2020 showed nonobstructive CAD (60% mid LAD, 40% ostial to proximal LCx, 40% distal RCA), normal filling pressures (RA 5, RV 35/2, PA 31/9/16, PWCP 13, LVEDP 13, CI 2.4)  Since last clinic visit, he reports that he is doing okay.  His weight is up 13 pounds.  He has been taking Lasix as needed but now  taking daily.  Does report his dyspnea has improved.  States that he will get chest pain with certain movements but otherwise denies any chest pain.  States that his lightheadedness has improved.  Denies any syncope.  He continues to have palpitations.  Denies any lower extremity edema but does report some abdominal swelling.   Wt Readings from Last 3 Encounters:  08/18/20 260 lb (117.9 kg)  07/14/20 253 lb (114.8 kg)  07/05/20 247 lb (112 kg)    Past Medical History:  Diagnosis Date  . Acute on chronic combined systolic and diastolic CHF (congestive heart failure) ([place]) 07/05/2020  . Hyperlipidemia   . Hypertension   . Osteoarthritis of right knee   . Right lumbar radiculitis   . SVT (supraventricular tachycardia) ([facility])     Past Surgical History:  Procedure Laterality Date  . RIGHT/LEFT HEART CATH AND CORONARY ANGIOGRAPHY N/A 07/05/2020   Procedure: RIGHT/LEFT HEART CATH AND CORONARY ANGIOGRAPHY;  Surgeon: Martinique, Peter M, MD;  Location: [facility] [facility];  Service: Cardiovascular;  Laterality: N/A;    Current Medications: Current Meds  Medication Sig  . amiodarone (PACERONE) 200 MG tablet Take 0.5 tablets (100 mg total) by mouth 2 (two) times daily.  Marland Kitchen apixaban (ELIQUIS) 5 MG TABS tablet Take 1 tablet (5 mg total) by mouth 2 (two) times daily.  . APPLE CIDER VINEGAR PO Take 1 each by mouth daily. Gummy  . carvedilol (COREG) 3.125 MG tablet  Take 1 tablet (3.125 mg total) by mouth 2 (two) times daily.  . Evolocumab (REPATHA) 140 MG/ML SOSY Inject into the skin.  . furosemide (LASIX) 20 MG tablet Take 1 tablet (20 mg total) by mouth daily as needed (weight increase of 3 lbs overnight or 5 lbs in 1 week).  Marland Kitchen losartan (COZAAR) 25 MG tablet Take 1 tablet (25 mg total) by mouth daily.  . Multiple Vitamins-Minerals (MULTIVITAMIN WITH MINERALS) tablet Take 1 tablet by mouth daily.  Marland Kitchen NEEDLE, DISP, 18 G 18G X 1" MISC Draw up vitamin B 12 once monthly.  . Probiotic Product (PROBIOTIC DAILY  PO) Take 1 capsule by mouth daily.  . Syringe/Needle, Disp, (SYRINGE 3CC/25GX1") 25G X 1" 3 ML MISC Inject vitamin B 12 once monthly.     Allergies:   Penicillins   Social History   Socioeconomic History  . Marital status: Married    Spouse name: Not on file  . Number of children: 2  . Years of education: Not on file  . Highest education level: Not on file  Occupational History    Employer: [employer]    Comment: Regulatory affairs officer  Tobacco Use  . Smoking status: Never Smoker  . Smokeless tobacco: Never Used  Substance and Sexual Activity  . Alcohol use: Yes    Comment: Occasional  . Drug use: Not on file  . Sexual activity: Not on file  Other Topics Concern  . Not on file  Social History Narrative  . Not on file   Social Determinants of Health   Financial Resource Strain: Not on file  Food Insecurity: Not on file  Transportation Needs: Not on file  Physical Activity: Not on file  Stress: Not on file  Social Connections: Not on file     Family History: The patient's family history includes CAD in his father.  ROS:   Please see the history of present illness. All other systems reviewed and are negative.  EKGs/Labs/Other Studies Reviewed:    The following studies were reviewed today:   EKG:   5/20-sinus rhythm with PACs in a pattern of bigeminy, left axis deviation, poor R wave progression, rate 77 4/22- Normal sinus rhythm with frequent PACs , Left axis deviation, Poor R progression, rate 81  3/22- SVT, rate 149.  Repeat EKG showed sinus tachycardia with PACs/PVCs and run of SVT, rate 129   Recent Labs: 12/27/2019: ALT 32 06/30/2020: Platelets 247; TSH 1.740 07/05/2020: Hemoglobin 15.6 08/18/2020: BUN 20; Creatinine, Ser 1.33; Magnesium 2.0; Potassium 4.2; Sodium 140  Recent Lipid Panel    Component Value Date/Time   CHOL 163 12/27/2019 1001   TRIG 95 12/27/2019 1001   HDL 54 12/27/2019 1001   CHOLHDL 3.0 12/27/2019 1001   LDLCALC 90  12/27/2019 1001    Physical Exam:    VS:  Ht 5\' 11"  (1.803 m)   Wt 260 lb (117.9 kg)   SpO2 95%   BMI 36.26 kg/m     Wt Readings from Last 3 Encounters:  08/18/20 260 lb (117.9 kg)  07/14/20 253 lb (114.8 kg)  07/05/20 247 lb (112 kg)     GEN: Well nourished, well developed in no acute distress HEENT: Normal NECK: No JVD; No carotid bruits CARDIAC: normal rate, irregular, no murmurs, rubs, gallops RESPIRATORY:  Clear to auscultation without rales, wheezing or rhonchi  ABDOMEN: Soft, non-tender, non-distended MUSCULOSKELETAL:  Trace edema SKIN: Warm and dry NEUROLOGIC:  Alert and oriented x 3 PSYCHIATRIC:  Normal affect  ASSESSMENT:    1. Chronic combined systolic (congestive) and diastolic (congestive) heart failure ([place])   2. SVT (supraventricular tachycardia) ([facility])   3. PAF (paroxysmal atrial fibrillation) ([facility])   4. Essential hypertension   5. Hyperlipidemia, unspecified hyperlipidemia type    PLAN:    Chronic combined systolic diastolic heart failure: Echocardiogram 06/26/2020 showed LVEF 35%, global hypokinesis, mild LV dilatation, grade 2 diastolic dysfunction, mild RV systolic dysfunction, mild to moderate left atrial dilatation, mild to moderate MR, mild AI.  LHC/RHC on 07/05/2020 showed nonobstructive CAD (60% mid LAD, 40% ostial to proximal LCx, 40% distal RCA), normal filling pressures (RA 5, RV 35/2, PA 31/9/16, PWCP 13, LVEDP 13, CI 2.4) -Given ischemia work-up unremarkable, cardiomyopathy could be tachycardia induced from frequent SVT/A. fib as below.  Follows with the EP, started on amiodarone -Continue losartan 25 mg daily -Continue carvedilol 3.125 mg twice daily -Continue Lasix 20 mg daily -Check BMP, if stable renal function/potassium we will plan to add spironolactone. -Follow-up in pharmacy heart failure clinic in 2 weeks to continue to titrate GDMT.  SVT: Intermittent runs of SVT to 170s during recent ED visit on 05/22/2020.  Was started on metoprolol  25 mg twice daily but states he only took once because thought it may have worsened his hip pain.  Zio patch x5 days on 06/12/2020 showed 20% A. fib burden, average rate 134 with longest episode lasting 5.5 hours with average rate 155 bpm.  There were 1089 episodes of SVT, longest lasting 14 minutes with average rate 176 bpm.  Frequent PACs (6.2%) along with occasional supraventricular couplets (2.7%) and triplets (1.1%). -Suspect SVT/A. fib as the cause of his cardiomyopathy as above.  Seen by EP, started amiodarone  Paroxysmal atrial fibrillation: Zio patch x5 days on 06/12/2020 showed 20% AF burden, average rate 134 with longest episode lasting 5.5 hours with average rate 155 bpm.  CHA2DS2-VASc score 3 (CHF, hypertension, age) -Continue carvedilol 3.125 mg twice daily -Continue Eliquis 5 mg twice daily  Hyperlipidemia: LDL 90 on 12/27/2019, was on Repatha but reports he stopped taking.  LHC showed nonobstructive CAD on 07/05/2020.  Encouraged him to restart Repatha.    Hypertension: Was on amlodipine 2.5 mg daily but stopped taking.  Adding losartan and carvedilol for heart failure as above  RTC in 2 months   Medication Adjustments/Labs and Tests Ordered: Current medicines are reviewed at length with the patient today.  Concerns regarding medicines are outlined above.  Orders Placed This Encounter  Procedures  . Basic metabolic panel  . Magnesium   No orders of the defined types were placed in this encounter.   Patient Instructions  Medication Instructions:  RESTART Repatha  *If you need a refill on your cardiac medications before your next appointment, please call your pharmacy*   Lab Work: BMET, Mag today  If you have labs (blood work) drawn today and your tests are completely normal, you will receive your results only by: Marland Kitchen MyChart Message (if you have MyChart) OR . A paper copy in the mail If you have any lab test that is abnormal or we need to change your treatment, we will  call you to review the results.  Follow-Up: At [facility], you and your health needs are our priority.  As part of our continuing mission to provide you with exceptional heart care, we have created designated Provider Care Teams.  These Care Teams include your primary Cardiologist (physician) and Advanced Practice Providers (APPs -  Physician Assistants and Nurse Practitioners)  who all work together to provide you with the care you need, when you need it.  We recommend signing up for the patient portal called "MyChart".  Sign up information is provided on this After Visit Summary.  MyChart is used to connect with patients for Virtual Visits (Telemedicine).  Patients are able to view lab/test results, encounter notes, upcoming appointments, etc.  Non-urgent messages can be sent to your provider as well.   To learn more about what you can do with MyChart, go to [email or web address].    Your next appointment:   2-4 weeks with pharmacist (HF med titration) 2 months with Dr. Maryann Alar as a scribe for Donato Heinz, MD.,have documented all relevant documentation on the behalf of Donato Heinz, MD,as directed by  Donato Heinz, MD while in the presence of Donato Heinz, MD.  Signed, Donato Heinz, MD  08/20/2020 4:12 PM    [place]

## 2020-08-18 NOTE — Patient Instructions (Signed)
Medication Instructions:  RESTART Repatha  *If you need a refill on your cardiac medications before your next appointment, please call your pharmacy*   Lab Work: BMET, Pax today  If you have labs (blood work) drawn today and your tests are completely normal, you will receive your results only by: Marland Kitchen MyChart Message (if you have MyChart) OR . A paper copy in the mail If you have any lab test that is abnormal or we need to change your treatment, we will call you to review the results.  Follow-Up: At [facility], you and your health needs are our priority.  As part of our continuing mission to provide you with exceptional heart care, we have created designated Provider Care Teams.  These Care Teams include your primary Cardiologist (physician) and Advanced Practice Providers (APPs -  Physician Assistants and Nurse Practitioners) who all work together to provide you with the care you need, when you need it.  We recommend signing up for the patient portal called "MyChart".  Sign up information is provided on this After Visit Summary.  MyChart is used to connect with patients for Virtual Visits (Telemedicine).  Patients are able to view lab/test results, encounter notes, upcoming appointments, etc.  Non-urgent messages can be sent to your provider as well.   To learn more about what you can do with MyChart, go to [email or web address].    Your next appointment:   2-4 weeks with pharmacist (HF med titration) 2 months with Dr. Gardiner Rhyme

## 2020-08-19 LAB — BASIC METABOLIC PANEL
BUN/Creatinine Ratio: 15 (ref 10–24)
BUN: 20 mg/dL (ref 8–27)
CO2: 20 mmol/L (ref 20–29)
Calcium: 9 mg/dL (ref 8.6–10.2)
Chloride: 105 mmol/L (ref 96–106)
Creatinine, Ser: 1.33 mg/dL — ABNORMAL HIGH (ref 0.76–1.27)
Glucose: 184 mg/dL — ABNORMAL HIGH (ref 65–99)
Potassium: 4.2 mmol/L (ref 3.5–5.2)
Sodium: 140 mmol/L (ref 134–144)
eGFR: 58 mL/min/{1.73_m2} — ABNORMAL LOW (ref >59)

## 2020-08-19 LAB — MAGNESIUM: Magnesium: 2 mg/dL (ref 1.6–2.3)

## 2020-08-21 ENCOUNTER — Other Ambulatory Visit: Payer: Self-pay | Admitting: Sports Medicine

## 2020-08-21 ENCOUNTER — Other Ambulatory Visit: Payer: Self-pay | Admitting: *Deleted

## 2020-08-21 DIAGNOSIS — I5042 Chronic combined systolic (congestive) and diastolic (congestive) heart failure: Secondary | ICD-10-CM

## 2020-08-21 DIAGNOSIS — Z79899 Other long term (current) drug therapy: Secondary | ICD-10-CM

## 2020-08-21 DIAGNOSIS — E7800 Pure hypercholesterolemia, unspecified: Secondary | ICD-10-CM

## 2020-08-21 MED ORDER — SPIRONOLACTONE 25 MG PO TABS: 12.5000 mg | ORAL_TABLET | Freq: Every day | ORAL | 3 refills | Status: DC

## 2020-08-23 NOTE — Addendum Note (Signed)
Addended by: Angelena Form on: 08/23/2020 01:46 PM   Modules accepted: Orders

## 2020-09-08 MED ORDER — RIVAROXABAN 20 MG PO TABS: 20.0000 mg | ORAL_TABLET | Freq: Every day | ORAL | 3 refills | Status: AC

## 2020-09-08 NOTE — Telephone Encounter (Signed)
Rockne Menghini, RPH-CPP  Cristopher Estimable, RN Hilda Blades   He is fine to take Xarelto 20 mg once daily WITH A MEAL.   Thanks,  Erasmo Downer

## 2020-09-08 NOTE — Telephone Encounter (Signed)
That is fine, we can do Xarelto if he prefers

## 2020-09-15 ENCOUNTER — Ambulatory Visit (INDEPENDENT_AMBULATORY_CARE_PROVIDER_SITE_OTHER): Payer: Medicare HMO | Admitting: Pharmacist Clinician (PhC)/ Clinical Pharmacy Specialist

## 2020-09-15 ENCOUNTER — Other Ambulatory Visit: Payer: Self-pay

## 2020-09-15 DIAGNOSIS — I5043 Acute on chronic combined systolic (congestive) and diastolic (congestive) heart failure: Secondary | ICD-10-CM

## 2020-09-15 DIAGNOSIS — Z79899 Other long term (current) drug therapy: Secondary | ICD-10-CM | POA: Diagnosis not present

## 2020-09-15 DIAGNOSIS — I5042 Chronic combined systolic (congestive) and diastolic (congestive) heart failure: Secondary | ICD-10-CM | POA: Diagnosis not present

## 2020-09-15 NOTE — Patient Instructions (Signed)
Return for a a follow up appointment in 2 weeks.    Go to the lab today to check potassium levels  Check your blood pressure at home daily (if able) and keep record of the readings.  Take your BP meds as follows:  Stop losartan  Start Entresto 24/26 mg twice daily  Continue with all other medications  Bring all of your meds, your BP cuff and your record of home blood pressures to your next appointment.  Exercise as you're able, try to walk approximately 30 minutes per day.  Keep salt intake to a minimum, especially watch canned and prepared boxed foods.  Eat more fresh fruits and vegetables and fewer canned items.  Avoid eating in fast food restaurants.    HOW TO TAKE YOUR BLOOD PRESSURE: Rest 5 minutes before taking your blood pressure.  Don't smoke or drink caffeinated beverages for at least 30 minutes before. Take your blood pressure before (not after) you eat. Sit comfortably with your back supported and both feet on the floor (don't cross your legs). Elevate your arm to heart level on a table or a desk. Use the proper sized cuff. It should fit smoothly and snugly around your bare upper arm. There should be enough room to slip a fingertip under the cuff. The bottom edge of the cuff should be 1 inch above the crease of the elbow. Ideally, take 3 measurements at one sitting and record the average.    Patient Assistance for Repatha:  The Health Well foundation offers assistance to help pay for medication copays.  They will cover copays for all cholesterol lowering meds, including statins, fibrates, omega-3 oils, ezetimibe, Repatha, Praluent, Nexletol, Nexlizet.  The cards are usually good for $2,500 or 12 months, whichever comes first. Go to healthwellfoundation.org Click on "Apply Now" Answer questions as to whom is applying (patient or representative) Your disease fund will be "hypercholesterolemia - Medicare access" They will ask questions about finances and which medications you are  taking for cholesterol When you submit, the approval is usually within minutes.  You will need to print the card information from the site You will need to show this information to your pharmacy, they will bill your Medicare Part D plan first -then bill Health Well --for the copay.   You can also call them at [phone], although the hold times can be quite long.     Thank you for choosing [facility]

## 2020-09-15 NOTE — Progress Notes (Signed)
09/18/2020 Robert Guerrero 1950/09/18 [id number]   HPI:  Robert Guerrero is a 70 y.o. male patient of Dr Gardiner Rhyme, with a PMH below who presents today for heart failure medication titration.  He saw Dr. Gardiner Rhyme last month to review echo done in March and heart catheterization from April.  Showed EF to be 35% with grade 2 diastolic dysfunction.  At that same appointment his weight was up 13 pounds (over 6 weeks) and he noted he was taking furosemide daily and had some abdominal swelling.    He is following up today with CVRR to continue with GDMT drug titration.  Most recent blood work showed potassium stable at 4.2 and SCr at 1.33.  Spironolactone 12.5 mg was added and he was asked to repeat labs after 2 weeks.  We will have this drawn today.    Past Medical History: hypertension BP controlled on HF medications  hyperlipidemia 9/21 LDL 90 on Repatha, baseline 217  CAD 4/22 cath - 60% mid LAD, 40% distal RCA and ostial to prox LCx  AF CHADS2-VASc score 3 (chf, htn, age); 20% burden on Zio patch x 5 days - on Xarelto 20 mg qd     Blood Pressure Goal:  130/80   Current Medications: losartan 25 mg qd, carvedilol 3.125 mg bid, spironolactone 12.5 mg qd  Social Hx: no, social alcohol; some caffeine  Home BP readings:  024-097 systolic mostly   Intolerances: beta blockers - fatigue; calcium channel blockers - constipation  Labs: 5/22:  Na 140, K 4.2, Glu 184, BUN 20, SCr 1.33, GFR 58   Wt Readings from Last 3 Encounters:  08/18/20 260 lb (117.9 kg)  07/14/20 253 lb (114.8 kg)  07/05/20 247 lb (112 kg)   BP Readings from Last 3 Encounters:  09/15/20 138/82  07/14/20 124/86  07/05/20 113/74   Pulse Readings from Last 3 Encounters:  09/15/20 (!) 54  07/14/20 90  07/05/20 62    Current Outpatient Medications  Medication Sig Dispense Refill   amiodarone (PACERONE) 200 MG tablet Take 0.5 tablets (100 mg total) by mouth 2 (two) times daily. 90 tablet 3   APPLE CIDER VINEGAR PO Take  1 each by mouth daily. Gummy     carvedilol (COREG) 3.125 MG tablet Take 1 tablet (3.125 mg total) by mouth 2 (two) times daily. 180 tablet 3   furosemide (LASIX) 20 MG tablet Take 1 tablet (20 mg total) by mouth daily as needed (weight increase of 3 lbs overnight or 5 lbs in 1 week). 90 tablet 3   losartan (COZAAR) 25 MG tablet Take 1 tablet (25 mg total) by mouth daily. 90 tablet 3   Multiple Vitamins-Minerals (MULTIVITAMIN WITH MINERALS) tablet Take 1 tablet by mouth daily.     NEEDLE, DISP, 18 G 18G X 1" MISC Draw up vitamin B 12 once monthly. 50 each prn   Probiotic Product (PROBIOTIC DAILY PO) Take 1 capsule by mouth daily.     REPATHA SURECLICK 353 MG/ML SOAJ INJECT 140MG  UNDER THE SKIN EVERY 14 DAYS 2 mL 11   rivaroxaban (XARELTO) 20 MG TABS tablet Take 1 tablet (20 mg total) by mouth daily with supper. 90 tablet 3   spironolactone (ALDACTONE) 25 MG tablet Take 0.5 tablets (12.5 mg total) by mouth daily. 45 tablet 3   Syringe/Needle, Disp, (SYRINGE 3CC/25GX1") 25G X 1" 3 ML MISC Inject vitamin B 12 once monthly. 50 each prn   No current facility-administered medications for this visit.    Allergies  Allergen Reactions   Penicillins Hives    Past Medical History:  Diagnosis Date   Acute on chronic combined systolic and diastolic CHF (congestive heart failure) ([place]) 07/05/2020   Hyperlipidemia    Hypertension    Osteoarthritis of right knee    Right lumbar radiculitis    SVT (supraventricular tachycardia) (H[facility])     Blood pressure 138/82, pulse (!) 54.  Acute on chronic combined systolic and diastolic CHF (congestive heart failure) ([place]) Patient with HFrEF currently on losartan, carvedilol and spironolactone.  Discussed GDMT and desire to switch losartan to [facility], as well as add Jardiance.  Patient already on 2 branded medications (Repatha and Xarelto), so there is some concern about coverage gap pricing on 4 medications (would be about $150/month for each).  He was given  paperwork to apply for patient assistance for Jardiance and Delene Loll, as well as HealthWell information for [place].  He was given a 2 week supply of Entresto, and asked to bring the PA paperwork back as soon as possible.  We won't start him on Jardiance until we hear from the manufacturer.  We will see him back in 2 weeks for follow up.     Tommy Medal PharmD CPP [facility] [address] [place], [place] [address] [phone]

## 2020-09-16 LAB — BASIC METABOLIC PANEL
BUN/Creatinine Ratio: 16 (ref 10–24)
BUN: 20 mg/dL (ref 8–27)
CO2: 24 mmol/L (ref 20–29)
Calcium: 9.5 mg/dL (ref 8.6–10.2)
Chloride: 103 mmol/L (ref 96–106)
Creatinine, Ser: 1.26 mg/dL (ref 0.76–1.27)
Glucose: 71 mg/dL (ref 65–99)
Potassium: 5.1 mmol/L (ref 3.5–5.2)
Sodium: 142 mmol/L (ref 134–144)
eGFR: 61 mL/min/{1.73_m2} (ref >59)

## 2020-09-18 ENCOUNTER — Encounter: Payer: Self-pay | Admitting: Pharmacist Clinician (PhC)/ Clinical Pharmacy Specialist

## 2020-09-18 NOTE — Assessment & Plan Note (Signed)
Patient with HFrEF currently on losartan, carvedilol and spironolactone.  Discussed GDMT and desire to switch losartan to [facility], as well as add Jardiance.  Patient already on 2 branded medications (Repatha and Xarelto), so there is some concern about coverage gap pricing on 4 medications (would be about $150/month for each).  He was given paperwork to apply for patient assistance for Jardiance and Delene Loll, as well as HealthWell information for [place].  He was given a 2 week supply of Entresto, and asked to bring the PA paperwork back as soon as possible.  We won't start him on Jardiance until we hear from the manufacturer.  We will see him back in 2 weeks for follow up.

## 2020-10-03 ENCOUNTER — Other Ambulatory Visit: Payer: Self-pay

## 2020-10-03 ENCOUNTER — Ambulatory Visit (INDEPENDENT_AMBULATORY_CARE_PROVIDER_SITE_OTHER): Payer: Medicare HMO | Admitting: Pharmacist Clinician (PhC)/ Clinical Pharmacy Specialist

## 2020-10-03 VITALS — BP 110/70 | HR 82 | Resp 17 | Ht 71.0 in | Wt 264.4 lb

## 2020-10-03 DIAGNOSIS — I5042 Chronic combined systolic (congestive) and diastolic (congestive) heart failure: Secondary | ICD-10-CM | POA: Diagnosis not present

## 2020-10-03 DIAGNOSIS — I5043 Acute on chronic combined systolic (congestive) and diastolic (congestive) heart failure: Secondary | ICD-10-CM | POA: Diagnosis not present

## 2020-10-03 NOTE — Progress Notes (Signed)
10/04/2020 Robert Guerrero 09/12/1950 [id number]   HPI:  Robert Guerrero is a 70 y.o. male patient of Dr Gardiner Rhyme, with a PMH below who presents today for heart failure medication titration.  He saw Dr. Gardiner Rhyme last month to review echo done in March and heart catheterization from April.  Showed EF to be 35% with grade 2 diastolic dysfunction.  At that same appointment his weight was up 13 pounds (over 6 weeks) and he noted he was taking furosemide daily and had some abdominal swelling.   He was seen about 2 weeks ago in CVRR to help with transitioning and titrating GDMT for his HF.   Losartan was switched to Entresto 24/26 mg and spironolactone 12.5 mg was added.  His most recent potassium level was 5.1  Today he returns for follow up.  His home BP cuffs are within a few points of each other, unfortunately they were both more than 40 points off from the office systolic reading.  Looking at the readings on his home cuffs, in the past few weeks those readings ranged from 378-588 systolic.    He states that he hoped to feel better by now, but is still feeling shortness of breath and fatigue much of the time.  He feels the fatigue has worsened since having his heart cath.  He also notes overall body aches, especially in his joints.  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  He also notes that he has been taking the furosemide daily, as opposed to prn.    Past Medical History: hypertension BP controlled on HF medications  hyperlipidemia 9/21 LDL 90 on Repatha, baseline 217  CAD 4/22 cath - 60% mid LAD, 40% distal RCA and ostial to prox LCx  AF CHADS2-VASc score 3 (chf, htn, age); 20% burden on Zio patch x 5 days - on Xarelto 20 mg qd     Blood Pressure Goal:  130/80   Current Medications: Entresto 24/26, carvedilol 3.125 mg bid, spironolactone 12.5 mg qd  Social Hx: no  tobacco, social alcohol; some caffeine  Home BP readings:  5027-741 systolic on one home meter - note that it read 45 points higher than office cuff, so unable to determine accuracy of the readings.    Diet: home cooked, moslty chicken, some tuna; some fruits/vegetables, oatmeal square cereal with walnuts for breakfast, not much for snacking  Exercise: no regular exercise; not enough energy (lightheaded, fatigued)   Intolerances: beta blockers - fatigue; calcium channel blockers - constipation  Labs: 5/22:  Na 140, K 4.2, Glu 184, BUN 20, SCr 1.33, GFR 58   Wt Readings from Last 3 Encounters:  10/03/20 264 lb 6.4 oz (119.9 kg)  08/18/20 260 lb (117.9 kg)  07/14/20 253 lb (114.8 kg)   BP Readings from Last 3 Encounters:  10/03/20 110/70  09/15/20 138/82  07/14/20 124/86   Pulse Readings from Last 3 Encounters:  10/03/20 82  09/15/20 (!) 54  07/14/20 90    Current Outpatient Medications  Medication Sig Dispense Refill   amiodarone (PACERONE) 200 MG tablet Take 0.5 tablets (100 mg total) by mouth 2 (two) times daily. 90 tablet 3   APPLE CIDER VINEGAR PO Take 1 each by mouth daily. Gummy     carvedilol (COREG) 3.125 MG tablet Take 1 tablet (3.125 mg total) by mouth 2 (two)  times daily. 180 tablet 3   furosemide (LASIX) 20 MG tablet Take 1 tablet (20 mg total) by mouth daily as needed (weight increase of 3 lbs overnight or 5 lbs in 1 week). 90 tablet 3   Multiple Vitamins-Minerals (MULTIVITAMIN WITH MINERALS) tablet Take 1 tablet by mouth daily.     NEEDLE, DISP, 18 G 18G X 1" MISC Draw up vitamin B 12 once monthly. 50 each prn   Probiotic Product (PROBIOTIC DAILY PO) Take 1 capsule by mouth daily.     REPATHA SURECLICK 607 MG/ML SOAJ INJECT 140MG  UNDER THE SKIN EVERY 14 DAYS 2 mL 11   rivaroxaban (XARELTO) 20 MG TABS tablet Take 1 tablet (20 mg total) by mouth daily with supper. 90 tablet 3   sacubitril-valsartan (ENTRESTO) 49-51 MG Take 1 tablet by mouth 2 (two) times daily.      spironolactone (ALDACTONE) 25 MG tablet Take 0.5 tablets (12.5 mg total) by mouth daily. 45 tablet 3   Syringe/Needle, Disp, (SYRINGE 3CC/25GX1") 25G X 1" 3 ML MISC Inject vitamin B 12 once monthly. 50 each prn   No current facility-administered medications for this visit.    Allergies  Allergen Reactions   Penicillins Hives    Past Medical History:  Diagnosis Date   Acute on chronic combined systolic and diastolic CHF (congestive heart failure) ([place]) 07/05/2020   Hyperlipidemia    Hypertension    Osteoarthritis of right knee    Right lumbar radiculitis    SVT (supraventricular tachycardia) (H[facility])     Blood pressure 110/70, pulse 82, resp. rate 17, height 5\' 11"  (1.803 m), weight 264 lb 6.4 oz (119.9 kg), SpO2 97 %.  Acute on chronic combined systolic and diastolic CHF (congestive heart failure) ([place]) Patient with HFrEF 35%, currently on Entresto, carvedilol and spironolactone.  Labs today show potassium has dropped to 4.7, however did have 20% increase in SCr.  BP was good in the office, so no changes were made in his medications.  Unsure if the fatigue and SOB are related to more AF episodes.  Today we focused on getting his meds in consistently and getting him into a routine with them.  No medication changes were made.  He is scheduled to see Dr. Gardiner Rhyme later this month.  He brought back patient assistance paperwork for both [place].  We will wait to start Jardiance once approved, as he cannot afford another branded medication at this time.    Tommy Medal PharmD CPP [facility] [address] [place], [place] [address] [phone]

## 2020-10-03 NOTE — Patient Instructions (Addendum)
Return for a a follow up appointment with Dr. Gardiner Rhyme on July 25  Go to the lab today  Try PAN foundation to get Repatha covered.  They should have a waiting list you can add your name to.   Check your blood pressure at home daily and keep record of the readings.  Take your BP meds as follows:  AM  (9-10 am):  amiodarone, Entresto, carvedilol, Xarelto  PM (6-7 pm): amiodarone, Entresto, carvedilol, spironolactone  Bring all of your meds, your BP cuff and your record of home blood pressures to your next appointment.  Exercise as you're able, try to walk approximately 30 minutes per day.  Keep salt intake to a minimum, especially watch canned and prepared boxed foods.  Eat more fresh fruits and vegetables and fewer canned items.  Avoid eating in fast food restaurants.    HOW TO TAKE YOUR BLOOD PRESSURE: Rest 5 minutes before taking your blood pressure.  Don't smoke or drink caffeinated beverages for at least 30 minutes before. Take your blood pressure before (not after) you eat. Sit comfortably with your back supported and both feet on the floor (don't cross your legs). Elevate your arm to heart level on a table or a desk. Use the proper sized cuff. It should fit smoothly and snugly around your bare upper arm. There should be enough room to slip a fingertip under the cuff. The bottom edge of the cuff should be 1 inch above the crease of the elbow. Ideally, take 3 measurements at one sitting and record the average.

## 2020-10-04 LAB — BASIC METABOLIC PANEL
BUN/Creatinine Ratio: 13 (ref 10–24)
BUN: 21 mg/dL (ref 8–27)
CO2: 20 mmol/L (ref 20–29)
Calcium: 9.3 mg/dL (ref 8.6–10.2)
Chloride: 104 mmol/L (ref 96–106)
Creatinine, Ser: 1.56 mg/dL — ABNORMAL HIGH (ref 0.76–1.27)
Glucose: 80 mg/dL (ref 65–99)
Potassium: 4.7 mmol/L (ref 3.5–5.2)
Sodium: 142 mmol/L (ref 134–144)
eGFR: 47 mL/min/{1.73_m2} — ABNORMAL LOW (ref >59)

## 2020-10-04 NOTE — Assessment & Plan Note (Addendum)
Patient with HFrEF 35%, currently on Entresto, carvedilol and spironolactone.  Labs today show potassium has dropped to 4.7, however did have 20% increase in SCr.  BP was good in the office, so no changes were made in his medications.  Unsure if the fatigue and SOB are related to more AF episodes.  Today we focused on getting his meds in consistently and getting him into a routine with them.  No medication changes were made.  He is scheduled to see Dr. Gardiner Rhyme later this month.  He brought back patient assistance paperwork for both [place].  We will wait to start Jardiance once approved, as he cannot afford another branded medication at this time.

## 2020-10-06 ENCOUNTER — Other Ambulatory Visit: Payer: Self-pay | Admitting: *Deleted

## 2020-10-06 ENCOUNTER — Telehealth: Payer: Self-pay | Admitting: *Deleted

## 2020-10-06 DIAGNOSIS — Z79899 Other long term (current) drug therapy: Secondary | ICD-10-CM

## 2020-10-06 DIAGNOSIS — I5042 Chronic combined systolic (congestive) and diastolic (congestive) heart failure: Secondary | ICD-10-CM

## 2020-10-06 MED ORDER — SPIRONOLACTONE 25 MG PO TABS: 12.5000 mg | ORAL_TABLET | ORAL | 3 refills | Status: AC

## 2020-10-06 NOTE — Telephone Encounter (Signed)
Assistance forms faxed to [employer] (Jardiance) and [employer] Delene Loll)

## 2020-10-09 DIAGNOSIS — M1712 Unilateral primary osteoarthritis, left knee: Secondary | ICD-10-CM | POA: Diagnosis not present

## 2020-10-11 ENCOUNTER — Telehealth: Payer: Self-pay

## 2020-10-11 NOTE — Telephone Encounter (Signed)
Fax received from [employer] indicating a PA was needed for [employer]. Using Covermymeds, a PA was completed and submitted; awaiting response.

## 2020-10-13 NOTE — Telephone Encounter (Signed)
Call from [place] at [facility] stating that an error had occurred during the processing of patient's authorization. To continue with the PA, we will have to call [employer] at [phone].

## 2020-10-16 ENCOUNTER — Other Ambulatory Visit: Payer: Self-pay | Admitting: Sports Medicine

## 2020-10-16 DIAGNOSIS — E538 Deficiency of other specified B group vitamins: Secondary | ICD-10-CM

## 2020-10-16 DIAGNOSIS — M1712 Unilateral primary osteoarthritis, left knee: Secondary | ICD-10-CM | POA: Diagnosis not present

## 2020-10-17 ENCOUNTER — Telehealth: Payer: Self-pay | Admitting: *Deleted

## 2020-10-17 NOTE — Telephone Encounter (Signed)
Repatha approval received from [facility] for [place].  Approved until 03/31/21.  Pharmacy notified.

## 2020-10-22 NOTE — Progress Notes (Deleted)
Cardiology Office Note:    Date:  10/23/2020   ID:  Robert Guerrero, DOB [birth year], MRN [id number]  PCP:  Silverio Decamp, MD  Cardiologist:  None  Electrophysiologist:  None   Referring MD: Silverio Decamp,*   No chief complaint on file.   History of Present Illness:    Robert Guerrero is a 70 y.o. male with a hx of SVT, hypertension, hyperlipidemia who presents for follow-up.  He was initially seen as an ED follow-up for SVT on 05/29/2020.  He was seen in the ED on 05/22/2020.  He presented to the ED with shortness of breath and palpitations.  Was noted to have intermittent runs of SVT to the 170s.  He reported it had significant caffeine intake and was not keeping up with oral hydration.  He was started on metoprolol 25 mg twice daily and discharge.  He previously followed with Dr. Stanford Breed for his SVT, last seen in 2015.  He was referred to Dr. Lovena Le to consider ablation.  He did not tolerate beta-blockers at that time due to fatigue and did not tolerate calcium channel blockers due to constipation.  Echocardiogram 06/26/2020 showed LVEF 35%, global hypokinesis, mild LV dilatation, grade 2 diastolic dysfunction, mild RV systolic dysfunction, mild to moderate left atrial dilatation, mild to moderate MR, mild AI.  Zio patch x5 days on 06/12/2020 showed 20% A. fib burden, average rate 134 with longest episode lasting 5.5 hours with average rate 155 bpm.  There were 1089 episodes of SVT, longest lasting 14 minutes with average rate 176 bpm.  Frequent PACs (6.2%) along with occasional supraventricular couplets (2.7%) and triplets (1.1%).  LHC/RHC on 07/05/2020 showed nonobstructive CAD (60% mid LAD, 40% ostial to proximal LCx, 40% distal RCA), normal filling pressures (RA 5, RV 35/2, PA 31/9/16, PWCP 13, LVEDP 13, CI 2.4)  Since last clinic visit,  Entresto dose, repeat monitor  he reports that he is doing okay.  His weight is up 13 pounds.  He has been taking Lasix as needed but now taking  daily.  Does report his dyspnea has improved.  States that he will get chest pain with certain movements but otherwise denies any chest pain.  States that his lightheadedness has improved.  Denies any syncope.  He continues to have palpitations.  Denies any lower extremity edema but does report some abdominal swelling.   Wt Readings from Last 3 Encounters:  10/23/20 266 lb (120.7 kg)  10/03/20 264 lb 6.4 oz (119.9 kg)  08/18/20 260 lb (117.9 kg)    Past Medical History:  Diagnosis Date   Acute on chronic combined systolic and diastolic CHF (congestive heart failure) ([place]) 07/05/2020   Hyperlipidemia    Hypertension    Osteoarthritis of right knee    Right lumbar radiculitis    SVT (supraventricular tachycardia) ([facility])     Past Surgical History:  Procedure Laterality Date   RIGHT/LEFT HEART CATH AND CORONARY ANGIOGRAPHY N/A 07/05/2020   Procedure: RIGHT/LEFT HEART CATH AND CORONARY ANGIOGRAPHY;  Surgeon: Martinique, Peter M, MD;  Location: [facility];  Service: Cardiovascular;  Laterality: N/A;    Current Medications: Current Meds  Medication Sig   amiodarone (PACERONE) 200 MG tablet Take 0.5 tablets (100 mg total) by mouth 2 (two) times daily.   carvedilol (COREG) 3.125 MG tablet Take 1 tablet (3.125 mg total) by mouth 2 (two) times daily.   cyanocobalamin (,VITAMIN B-12,) 1000 MCG/ML injection INJECT ONE ML INTO THE MUSCLE EVERY 30 DAYS   Multiple  Vitamins-Minerals (MULTIVITAMIN WITH MINERALS) tablet Take 1 tablet by mouth daily.   NEEDLE, DISP, 18 G 18G X 1" MISC Draw up vitamin B 12 once monthly.   omega-3 acid ethyl esters (LOVAZA) 1 g capsule Take by mouth 2 (two) times daily.   Probiotic Product (PROBIOTIC DAILY PO) Take 1 capsule by mouth daily.   REPATHA SURECLICK XX123456 MG/ML SOAJ INJECT '140MG'$  UNDER THE SKIN EVERY 14 DAYS   rivaroxaban (XARELTO) 20 MG TABS tablet Take 1 tablet (20 mg total) by mouth daily with supper.   sacubitril-valsartan (ENTRESTO) 49-51 MG Take 1 tablet  by mouth 2 (two) times daily.   spironolactone (ALDACTONE) 25 MG tablet Take 0.5 tablets (12.5 mg total) by mouth every other day.   Syringe/Needle, Disp, (SYRINGE 3CC/25GX1") 25G X 1" 3 ML MISC Inject vitamin B 12 once monthly.   TURMERIC CURCUMIN PO Take 2,000 mg by mouth daily.     Allergies:   Penicillins   Social History   Socioeconomic History   Marital status: Married    Spouse name: Not on file   Number of children: 2   Years of education: Not on file   Highest education level: Not on file  Occupational History    Employer: [employer]    Comment: Regulatory affairs officer  Tobacco Use   Smoking status: Never   Smokeless tobacco: Never  Substance and Sexual Activity   Alcohol use: Yes    Comment: Occasional   Drug use: Not on file   Sexual activity: Not on file  Other Topics Concern   Not on file  Social History Narrative   Not on file   Social Determinants of Health   Financial Resource Strain: Not on file  Food Insecurity: Not on file  Transportation Needs: Not on file  Physical Activity: Not on file  Stress: Not on file  Social Connections: Not on file     Family History: The patient's family history includes CAD in his father.  ROS:   Please see the history of present illness. All other systems reviewed and are negative.  EKGs/Labs/Other Studies Reviewed:    The following studies were reviewed today:   EKG:   5/20-sinus rhythm with PACs in a pattern of bigeminy, left axis deviation, poor R wave progression, rate 77 4/22- Normal sinus rhythm with frequent PACs , Left axis deviation, Poor R progression, rate 81  3/22- SVT, rate 149.  Repeat EKG showed sinus tachycardia with PACs/PVCs and run of SVT, rate 129   Recent Labs: 12/27/2019: ALT 32 06/30/2020: Platelets 247; TSH 1.740 07/05/2020: Hemoglobin 15.6 08/18/2020: Magnesium 2.0 10/03/2020: BUN 21; Creatinine, Ser 1.56; Potassium 4.7; Sodium 142  Recent Lipid Panel    Component Value  Date/Time   CHOL 163 12/27/2019 1001   TRIG 95 12/27/2019 1001   HDL 54 12/27/2019 1001   CHOLHDL 3.0 12/27/2019 1001   LDLCALC 90 12/27/2019 1001    Physical Exam:    VS:  BP (!) 160/82   Pulse (!) 59   Resp 18   Ht '5\' 11"'$  (1.803 m)   Wt 266 lb (120.7 kg)   SpO2 93%   BMI 37.10 kg/m     Wt Readings from Last 3 Encounters:  10/23/20 266 lb (120.7 kg)  10/03/20 264 lb 6.4 oz (119.9 kg)  08/18/20 260 lb (117.9 kg)     GEN: Well nourished, well developed in no acute distress HEENT: Normal NECK: No JVD; No carotid bruits CARDIAC: normal rate, irregular, no  murmurs, rubs, gallops RESPIRATORY:  Clear to auscultation without rales, wheezing or rhonchi  ABDOMEN: Soft, non-tender, non-distended MUSCULOSKELETAL:  Trace edema SKIN: Warm and dry NEUROLOGIC:  Alert and oriented x 3 PSYCHIATRIC:  Normal affect   ASSESSMENT:    1. Chronic combined systolic (congestive) and diastolic (congestive) heart failure ([place])   2. SVT (supraventricular tachycardia) ([place])   3. PAF (paroxysmal atrial fibrillation) ([place])   4. Essential hypertension   5. Daytime somnolence   6. Snoring     PLAN:    Chronic combined systolic diastolic heart failure: Echocardiogram 06/26/2020 showed LVEF 35%, global hypokinesis, mild LV dilatation, grade 2 diastolic dysfunction, mild RV systolic dysfunction, mild to moderate left atrial dilatation, mild to moderate MR, mild AI.  LHC/RHC on 07/05/2020 showed nonobstructive CAD (60% mid LAD, 40% ostial to proximal LCx, 40% distal RCA), normal filling pressures (RA 5, RV 35/2, PA 31/9/16, PWCP 13, LVEDP 13, CI 2.4) -Given ischemia work-up unremarkable, cardiomyopathy could be tachycardia induced from frequent SVT/A. fib as below.  Follows with the EP, started on amiodarone.  We will recheck Zio patch x7 days to evaluate AF/SVT burden since starting amiodarone -Continue Entresto 24-26 mg BID.  Will check BMP, if stable renal function/potassium, plan to increase dose to  49-51 mg twice daily -Continue carvedilol 3.125 mg twice daily -Continue Lasix 20 mg daily as needed for weight gain greater than 3 pounds in 1 day or 5 pounds in 1 week.  Appears euvolemic -Continue spironolactone 12.5 mg qod -Applying for patient assistance for Jardiance -Cardiac MRI to work-up nonischemic cardiomyopathy   SVT: Intermittent runs of SVT to 170s during recent ED visit on 05/22/2020.  Was started on metoprolol 25 mg twice daily but states he only took once because thought it may have worsened his hip pain.  Zio patch x5 days on 06/12/2020 showed 20% A. fib burden, average rate 134 with longest episode lasting 5.5 hours with average rate 155 bpm.  There were 1089 episodes of SVT, longest lasting 14 minutes with average rate 176 bpm.  Frequent PACs (6.2%) along with occasional supraventricular couplets (2.7%) and triplets (1.1%). -Suspect SVT/A. fib as the cause of his cardiomyopathy as above.  Seen by EP, started amiodarone 100 mg BID.  We will repeat Zio patch x7 days to evaluate AF/SVT burden since starting amiodarone  Paroxysmal atrial fibrillation: Zio patch x5 days on 06/12/2020 showed 20% AF burden, average rate 134 with longest episode lasting 5.5 hours with average rate 155 bpm.  CHA2DS2-VASc score 3 (CHF, hypertension, age) -Continue carvedilol 3.125 mg twice daily -Continue Xarelto -Continue amiodarone 100 mg twice daily, follows with EP -Suspect untreated OSA, checking sleep study  Hyperlipidemia: LDL 90 on 12/27/2019, was on Repatha but reports he stopped taking.  LHC showed nonobstructive CAD on 07/05/2020.  Has restarted Repatha  Hypertension: Was on amlodipine 2.5 mg daily but stopped taking.  Added Entresto, carvedilol, spironolactone as above  Snoring/daytime somnolence: Suspect untreated OSA, check sleep study  RTC in 2 months   Medication Adjustments/Labs and Tests Ordered: Current medicines are reviewed at length with the patient today.  Concerns regarding  medicines are outlined above.  Orders Placed This Encounter  Procedures   MR CARDIAC MORPHOLOGY W WO CONTRAST   Basic metabolic panel   CBC   LONG TERM MONITOR (3-14 DAYS)   EKG 12-Lead   Home sleep test    No orders of the defined types were placed in this encounter.   Patient Instructions  Medication Instructions:  Your physician recommends that you continue on your current medications as directed. Please refer to the Current Medication list given to you today.  *If you need a refill on your cardiac medications before your next appointment, please call your pharmacy*   Lab Work: BMET, CBC today  If you have labs (blood work) drawn today and your tests are completely normal, you will receive your results only by: [place] (if you have MyChart) OR A paper copy in the mail If you have any lab test that is abnormal or we need to change your treatment, we will call you to review the results.   Testing/Procedures: Your physician has requested that you have a cardiac MRI. Cardiac MRI uses a computer to create images of your heart as its beating, producing both still and moving pictures of your heart and major blood vessels. For further information please visit [email or web address] Please follow the instruction sheet given to you today for more information.  ZIO XT- Long Term Monitor Instructions   Your physician has requested you wear a ZIO patch monitor for __7_ days.  This is a single patch monitor.   [employer] supplies one patch monitor per enrollment. Additional stickers are not available. Please do not apply patch if you will be having a Nuclear Stress Test, Echocardiogram, Cardiac CT, MRI, or Chest Xray during the period you would be wearing the monitor. The patch cannot be worn during these tests. You cannot remove and re-apply the ZIO XT patch monitor.  Your ZIO patch monitor will be sent Fed Ex from [employer] directly to your home address. It may take 3-5 days  to receive your monitor after you have been enrolled.  Once you have received your monitor, please review the enclosed instructions. Your monitor has already been registered assigning a specific monitor serial # to you.  Billing and Patient Assistance Program Information   We have supplied [employer] with any of your insurance information on file for billing purposes. [employer] offers a sliding scale Patient Assistance Program for patients that do not have insurance, or whose insurance does not completely cover the cost of the ZIO monitor.   You must apply for the Patient Assistance Program to qualify for this discounted rate.     To apply, please call [employer] at [phone], select option 4, then select option 2, and ask to apply for Patient Assistance Program.  Theodore Demark will ask your household income, and how many people are in your household.  They will quote your out-of-pocket cost based on that information.  [employer] will also be able to set up a 29-month interest-free payment plan if needed.  Applying the monitor   Shave hair from upper left chest.  Hold abrader disc by orange tab. Rub abrader in 40 strokes over the upper left chest as indicated in your monitor instructions.  Clean area with 4 enclosed alcohol pads. Let dry.  Apply patch as indicated in monitor instructions. Patch will be placed under collarbone on left side of chest with arrow pointing upward.  Rub patch adhesive wings for 2 minutes. Remove white label marked "1". Remove the white label marked "2". Rub patch adhesive wings for 2 additional minutes.  While looking in a mirror, press and release button in center of patch. A small green light will flash 3-4 times. This will be your only indicator that the monitor has been turned on. ?  Do not shower for the first 24 hours. You may shower after the first 24 hours.  Press the button if you feel a symptom. You will hear a small click. Record Date, Time and Symptom in the Patient  Logbook.  When you are ready to remove the patch, follow instructions on the last 2 pages of the Patient Logbook. Stick patch monitor onto the last page of Patient Logbook.  Place Patient Logbook in the blue and white box.  Use locking tab on box and tape box closed securely.  The blue and white box has prepaid postage on it. Please place it in the mailbox as soon as possible. Your physician should have your test results approximately 7 days after the monitor has been mailed back to [facility].  Call [facility] at [phone] if you have questions regarding your ZIO XT patch monitor. Call them immediately if you see an orange light blinking on your monitor.  If your monitor falls off in less than 4 days, contact our Monitor department at [phone]. ?If your monitor becomes loose or falls off after 4 days call [employer] at [phone] for suggestions on securing your monitor.?  Your physician has recommended that you have a home sleep study. This test records several body functions during sleep, including: brain activity, eye movement, oxygen and carbon dioxide blood levels, heart rate and rhythm, breathing rate and rhythm, the flow of air through your mouth and nose, snoring, body muscle movements, and chest and belly movement.   Follow-Up: At [facility], you and your health needs are our priority.  As part of our continuing mission to provide you with exceptional heart care, we have created designated Provider Care Teams.  These Care Teams include your primary Cardiologist (physician) and Advanced Practice Providers (APPs -  Physician Assistants and Nurse Practitioners) who all work together to provide you with the care you need, when you need it.  We recommend signing up for the patient portal called "MyChart".  Sign up information is provided on this After Visit Summary.  MyChart is used to connect with patients for Virtual Visits (Telemedicine).  Patients are able to  view lab/test results, encounter notes, upcoming appointments, etc.  Non-urgent messages can be sent to your provider as well.   To learn more about what you can do with MyChart, go to [email or web address].    Your next appointment:   Friday, 10/14 at 10:20 AM with Dr. Maryann Alar as a scribe for Donato Heinz, MD.,have documented all relevant documentation on the behalf of Donato Heinz, MD,as directed by  Donato Heinz, MD while in the presence of Donato Heinz, MD.  Signed, Donato Heinz, MD  10/23/2020 4:04 PM    [place]

## 2020-10-23 ENCOUNTER — Ambulatory Visit (INDEPENDENT_AMBULATORY_CARE_PROVIDER_SITE_OTHER): Payer: Medicare HMO

## 2020-10-23 ENCOUNTER — Encounter: Payer: Self-pay | Admitting: Cardiology

## 2020-10-23 ENCOUNTER — Encounter: Payer: Self-pay | Admitting: *Deleted

## 2020-10-23 ENCOUNTER — Ambulatory Visit: Payer: Medicare HMO | Admitting: Cardiology

## 2020-10-23 ENCOUNTER — Other Ambulatory Visit: Payer: Self-pay

## 2020-10-23 VITALS — BP 160/82 | HR 59 | Resp 18 | Ht 71.0 in | Wt 266.0 lb

## 2020-10-23 DIAGNOSIS — I5042 Chronic combined systolic (congestive) and diastolic (congestive) heart failure: Secondary | ICD-10-CM

## 2020-10-23 DIAGNOSIS — M1712 Unilateral primary osteoarthritis, left knee: Secondary | ICD-10-CM | POA: Diagnosis not present

## 2020-10-23 DIAGNOSIS — I480 Paroxysmal atrial fibrillation: Secondary | ICD-10-CM

## 2020-10-23 DIAGNOSIS — I471 Supraventricular tachycardia: Secondary | ICD-10-CM | POA: Diagnosis not present

## 2020-10-23 DIAGNOSIS — I10 Essential (primary) hypertension: Secondary | ICD-10-CM

## 2020-10-23 DIAGNOSIS — R0683 Snoring: Secondary | ICD-10-CM | POA: Diagnosis not present

## 2020-10-23 DIAGNOSIS — R400 Somnolence: Secondary | ICD-10-CM | POA: Diagnosis not present

## 2020-10-23 NOTE — Patient Instructions (Signed)
Medication Instructions:  Your physician recommends that you continue on your current medications as directed. Please refer to the Current Medication list given to you today.  *If you need a refill on your cardiac medications before your next appointment, please call your pharmacy*   Lab Work: BMET, CBC today  If you have labs (blood work) drawn today and your tests are completely normal, you will receive your results only by: [place] (if you have MyChart) OR A paper copy in the mail If you have any lab test that is abnormal or we need to change your treatment, we will call you to review the results.   Testing/Procedures: Your physician has requested that you have a cardiac MRI. Cardiac MRI uses a computer to create images of your heart as its beating, producing both still and moving pictures of your heart and major blood vessels. For further information please visit [email or web address] Please follow the instruction sheet given to you today for more information.  ZIO XT- Long Term Monitor Instructions   Your physician has requested you wear a ZIO patch monitor for __7_ days.  This is a single patch monitor.   [employer] supplies one patch monitor per enrollment. Additional stickers are not available. Please do not apply patch if you will be having a Nuclear Stress Test, Echocardiogram, Cardiac CT, MRI, or Chest Xray during the period you would be wearing the monitor. The patch cannot be worn during these tests. You cannot remove and re-apply the ZIO XT patch monitor.  Your ZIO patch monitor will be sent Fed Ex from [employer] directly to your home address. It may take 3-5 days to receive your monitor after you have been enrolled.  Once you have received your monitor, please review the enclosed instructions. Your monitor has already been registered assigning a specific monitor serial # to you.  Billing and Patient Assistance Program Information   We have supplied [employer] with  any of your insurance information on file for billing purposes. [employer] offers a sliding scale Patient Assistance Program for patients that do not have insurance, or whose insurance does not completely cover the cost of the ZIO monitor.   You must apply for the Patient Assistance Program to qualify for this discounted rate.     To apply, please call [employer] at [phone], select option 4, then select option 2, and ask to apply for Patient Assistance Program.  Theodore Demark will ask your household income, and how many people are in your household.  They will quote your out-of-pocket cost based on that information.  [employer] will also be able to set up a 62-month interest-free payment plan if needed.  Applying the monitor   Shave hair from upper left chest.  Hold abrader disc by orange tab. Rub abrader in 40 strokes over the upper left chest as indicated in your monitor instructions.  Clean area with 4 enclosed alcohol pads. Let dry.  Apply patch as indicated in monitor instructions. Patch will be placed under collarbone on left side of chest with arrow pointing upward.  Rub patch adhesive wings for 2 minutes. Remove white label marked "1". Remove the white label marked "2". Rub patch adhesive wings for 2 additional minutes.  While looking in a mirror, press and release button in center of patch. A small green light will flash 3-4 times. This will be your only indicator that the monitor has been turned on. ?  Do not shower for the first 24 hours. You may shower after the first  24 hours.  Press the button if you feel a symptom. You will hear a small click. Record Date, Time and Symptom in the Patient Logbook.  When you are ready to remove the patch, follow instructions on the last 2 pages of the Patient Logbook. Stick patch monitor onto the last page of Patient Logbook.  Place Patient Logbook in the blue and white box.  Use locking tab on box and tape box closed securely.  The blue and white box has prepaid  postage on it. Please place it in the mailbox as soon as possible. Your physician should have your test results approximately 7 days after the monitor has been mailed back to [facility].  Call [facility] at [phone] if you have questions regarding your ZIO XT patch monitor. Call them immediately if you see an orange light blinking on your monitor.  If your monitor falls off in less than 4 days, contact our Monitor department at [phone]. ?If your monitor becomes loose or falls off after 4 days call [employer] at [phone] for suggestions on securing your monitor.?  Your physician has recommended that you have a home sleep study. This test records several body functions during sleep, including: brain activity, eye movement, oxygen and carbon dioxide blood levels, heart rate and rhythm, breathing rate and rhythm, the flow of air through your mouth and nose, snoring, body muscle movements, and chest and belly movement.   Follow-Up: At [facility], you and your health needs are our priority.  As part of our continuing mission to provide you with exceptional heart care, we have created designated Provider Care Teams.  These Care Teams include your primary Cardiologist (physician) and Advanced Practice Providers (APPs -  Physician Assistants and Nurse Practitioners) who all work together to provide you with the care you need, when you need it.  We recommend signing up for the patient portal called "MyChart".  Sign up information is provided on this After Visit Summary.  MyChart is used to connect with patients for Virtual Visits (Telemedicine).  Patients are able to view lab/test results, encounter notes, upcoming appointments, etc.  Non-urgent messages can be sent to your provider as well.   To learn more about what you can do with MyChart, go to [email or web address].    Your next appointment:   Friday, 10/14 at 10:20 AM with Dr. Gardiner Rhyme

## 2020-10-23 NOTE — Progress Notes (Signed)
Cardiology Office Note:    Date:  10/29/2020   ID:  Robert Guerrero, DOB 05-28-1950, MRN [id number]  PCP:  Silverio Decamp, MD  Cardiologist:  None  Electrophysiologist:  None   Referring MD: Silverio Decamp,*   Chief Complaint  Patient presents with   Congestive Heart Failure     History of Present Illness:    Robert Guerrero is a 70 y.o. male with a hx of SVT, hypertension, hyperlipidemia who presents for follow-up.  He was initially seen as an ED follow-up for SVT on 05/29/2020.  He was seen in the ED on 05/22/2020.  He presented to the ED with shortness of breath and palpitations.  Was noted to have intermittent runs of SVT to the 170s.  He reported it had significant caffeine intake and was not keeping up with oral hydration.  He was started on metoprolol 25 mg twice daily and discharge.  He previously followed with Dr. Stanford Breed for his SVT, last seen in 2015.  He was referred to Dr. Lovena Le to consider ablation.  He did not tolerate beta-blockers at that time due to fatigue and did not tolerate calcium channel blockers due to constipation.  Echocardiogram 06/26/2020 showed LVEF 35%, global hypokinesis, mild LV dilatation, grade 2 diastolic dysfunction, mild RV systolic dysfunction, mild to moderate left atrial dilatation, mild to moderate MR, mild AI.  Zio patch x5 days on 06/12/2020 showed 20% A. fib burden, average rate 134 with longest episode lasting 5.5 hours with average rate 155 bpm.  There were 1089 episodes of SVT, longest lasting 14 minutes with average rate 176 bpm.  Frequent PACs (6.2%) along with occasional supraventricular couplets (2.7%) and triplets (1.1%).  LHC/RHC on 07/05/2020 showed nonobstructive CAD (60% mid LAD, 40% ostial to proximal LCx, 40% distal RCA), normal filling pressures (RA 5, RV 35/2, PA 31/9/16, PWCP 13, LVEDP 13, CI 2.4)  Since last clinic visit, he reports he is doing okay.  Continues to have shortness of breath.  He also has occasional chest  pressure when he lays on his left side. He also experiences lightheadedness when blood pressure is low and have constant fatigued. He denies of having a sleep study however he knows he occasionally snores. He currently takes rivaroxaban 20 MG and denies blood in stool.    Wt Readings from Last 3 Encounters:  10/23/20 266 lb (120.7 kg)  10/03/20 264 lb 6.4 oz (119.9 kg)  08/18/20 260 lb (117.9 kg)    Past Medical History:  Diagnosis Date   Acute on chronic combined systolic and diastolic CHF (congestive heart failure) ([place]) 07/05/2020   Hyperlipidemia    Hypertension    Osteoarthritis of right knee    Right lumbar radiculitis    SVT (supraventricular tachycardia) (HCC)     Past Surgical History:  Procedure Laterality Date   RIGHT/LEFT HEART CATH AND CORONARY ANGIOGRAPHY N/A 07/05/2020   Procedure: RIGHT/LEFT HEART CATH AND CORONARY ANGIOGRAPHY;  Surgeon: Martinique, Peter M, MD;  Location: [facility];  Service: Cardiovascular;  Laterality: N/A;    Current Medications: Current Meds  Medication Sig   amiodarone (PACERONE) 200 MG tablet Take 0.5 tablets (100 mg total) by mouth 2 (two) times daily.   carvedilol (COREG) 3.125 MG tablet Take 1 tablet (3.125 mg total) by mouth 2 (two) times daily.   cyanocobalamin (,VITAMIN B-12,) 1000 MCG/ML injection INJECT ONE ML INTO THE MUSCLE EVERY 30 DAYS   Multiple Vitamins-Minerals (MULTIVITAMIN WITH MINERALS) tablet Take 1 tablet by mouth daily.  NEEDLE, DISP, 18 G 18G X 1" MISC Draw up vitamin B 12 once monthly.   omega-3 acid ethyl esters (LOVAZA) 1 g capsule Take by mouth 2 (two) times daily.   Probiotic Product (PROBIOTIC DAILY PO) Take 1 capsule by mouth daily.   REPATHA SURECLICK XX123456 MG/ML SOAJ INJECT '140MG'$  UNDER THE SKIN EVERY 14 DAYS   rivaroxaban (XARELTO) 20 MG TABS tablet Take 1 tablet (20 mg total) by mouth daily with supper.   sacubitril-valsartan (ENTRESTO) 49-51 MG Take 1 tablet by mouth 2 (two) times daily.   spironolactone  (ALDACTONE) 25 MG tablet Take 0.5 tablets (12.5 mg total) by mouth every other day.   Syringe/Needle, Disp, (SYRINGE 3CC/25GX1") 25G X 1" 3 ML MISC Inject vitamin B 12 once monthly.   TURMERIC CURCUMIN PO Take 2,000 mg by mouth daily.     Allergies:   Penicillins   Social History   Socioeconomic History   Marital status: Married    Spouse name: Not on file   Number of children: 2   Years of education: Not on file   Highest education level: Not on file  Occupational History    Employer: [employer]    Comment: Regulatory affairs officer  Tobacco Use   Smoking status: Never   Smokeless tobacco: Never  Substance and Sexual Activity   Alcohol use: Yes    Comment: Occasional   Drug use: Not on file   Sexual activity: Not on file  Other Topics Concern   Not on file  Social History Narrative   Not on file   Social Determinants of Health   Financial Resource Strain: Not on file  Food Insecurity: Not on file  Transportation Needs: Not on file  Physical Activity: Not on file  Stress: Not on file  Social Connections: Not on file     Family History: The patient's family history includes CAD in his father.  ROS:   Please see the history of present illness. (+) shortness of breath (+) chest pressure, when laying on left side of body (+) fatigued (+) lightheadedness All other systems reviewed and are negative.  EKGs/Labs/Other Studies Reviewed:    The following studies were reviewed today: Cardiac cath 04/22  Mid LAD lesion is 60% stenosed. Ost Cx to Prox Cx lesion is 40% stenosed. Dist RCA lesion is 40% stenosed with 40% stenosed side branch in RPAV. LV end diastolic pressure is normal.   1. Nonobstructive CAD. Diffuse coronary ectasia. 60% mid LAD 2. Normal LV filling pressures 3. Normal LVEDP 4. Preserved cardiac output index 2.4. 5. Frequent SVT noted during procedure with HR 140 bpm. Responded to IV metoprolol   Plan: medical management.  Echo 03/22:   IMPRESSIONS    1. Frequent ectopy and poor acoustic windows make accurate assessment of  LVEF difficult.. Left ventricular ejection fraction, by estimation, is  35%%. The left ventricle has moderately decreased function. The left  ventricle demonstrates global  hypokinesis. The left ventricular internal cavity size was mildly dilated.  There is moderate left ventricular hypertrophy. Left ventricular diastolic  parameters are consistent with Grade II diastolic dysfunction  (pseudonormalization).   2. Right ventricular systolic function is mildly reduced. The right  ventricular size is normal.   3. Left atrial size was mild to moderately dilated.   4. The mitral valve is abnormal. Mild to moderate mitral valve  regurgitation.   5. The aortic valve is abnormal. Aortic valve regurgitation is mild. Mild  aortic valve sclerosis is present, with  no evidence of aortic valve  stenosis.   6. The inferior vena cava is normal in size with greater than 50%  respiratory variability, suggesting right atrial pressure of 3 mmHg.   EKG:   07/22: sinus bradycardia, rate 59, sinus arrhythmia, left-axis deviation, nonspecific T wave flattening 5/20-sinus rhythm with PACs in a pattern of bigeminy, left axis deviation, poor R wave progression, rate 77 4/22- Normal sinus rhythm with frequent PACs , Left axis deviation, Poor R progression, rate 81  3/22- SVT, rate 149.  Repeat EKG showed sinus tachycardia with PACs/PVCs and run of SVT, rate 129   Recent Labs: 12/27/2019: ALT 32 06/30/2020: TSH 1.740 08/18/2020: Magnesium 2.0 10/23/2020: BUN 16; Creatinine, Ser 1.25; Hemoglobin 16.5; Platelets 224; Potassium 4.4; Sodium 146  Recent Lipid Panel    Component Value Date/Time   CHOL 163 12/27/2019 1001   TRIG 95 12/27/2019 1001   HDL 54 12/27/2019 1001   CHOLHDL 3.0 12/27/2019 1001   LDLCALC 90 12/27/2019 1001    Physical Exam:    VS:  BP (!) 160/82   Pulse (!) 59   Resp 18   Ht '5\' 11"'$  (1.803 m)   Wt  266 lb (120.7 kg)   SpO2 93%   BMI 37.10 kg/m     Wt Readings from Last 3 Encounters:  10/23/20 266 lb (120.7 kg)  10/03/20 264 lb 6.4 oz (119.9 kg)  08/18/20 260 lb (117.9 kg)     GEN: Well nourished, well developed in no acute distress HEENT: Normal NECK: No JVD; No carotid bruits CARDIAC: normal rate, irregular, no murmurs, rubs, gallops RESPIRATORY:  Clear to auscultation without rales, wheezing or rhonchi  ABDOMEN: Soft, non-tender, non-distended MUSCULOSKELETAL:  Trace edema SKIN: Warm and dry NEUROLOGIC:  Alert and oriented x 3 PSYCHIATRIC:  Normal affect   ASSESSMENT:    1. Chronic combined systolic (congestive) and diastolic (congestive) heart failure ([place])   2. SVT (supraventricular tachycardia) ([place])   3. PAF (paroxysmal atrial fibrillation) ([place])   4. Essential hypertension   5. Daytime somnolence   6. Snoring     PLAN:    Chronic combined systolic diastolic heart failure: Echocardiogram 06/26/2020 showed LVEF 35%, global hypokinesis, mild LV dilatation, grade 2 diastolic dysfunction, mild RV systolic dysfunction, mild to moderate left atrial dilatation, mild to moderate MR, mild AI.  LHC/RHC on 07/05/2020 showed nonobstructive CAD (60% mid LAD, 40% ostial to proximal LCx, 40% distal RCA), normal filling pressures (RA 5, RV 35/2, PA 31/9/16, PWCP 13, LVEDP 13, CI 2.4) -Given ischemia work-up unremarkable, cardiomyopathy could be tachycardia induced from frequent SVT/A. fib as below.  Follows with the EP, started on amiodarone.  Will recheck Zio patch x7 days to evaluate AF/SVT burden since starting amiodarone -Continue Entresto 24-26 mg BID.  Will check BMP, if stable renal function/potassium, plan to increase dose to 49-51 mg twice daily -Continue carvedilol 3.125 mg twice daily -Continue Lasix 20 mg daily as needed for weight gain greater than 3 pounds in 1 day or 5 pounds in 1 week.  Appears euvolemic -Continue spironolactone 12.5 mg qod -Applying for patient  assistance for Jardiance -Cardiac MRI to work-up nonischemic cardiomyopathy  SVT: Intermittent runs of SVT to 170s during recent ED visit on 05/22/2020.  Was started on metoprolol 25 mg twice daily but states he only took once because thought it may have worsened his hip pain.  Zio patch x5 days on 06/12/2020 showed 20% A. fib burden, average rate 134 with longest episode lasting 5.5 hours  with average rate 155 bpm.  There were 1089 episodes of SVT, longest lasting 14 minutes with average rate 176 bpm.  Frequent PACs (6.2%) along with occasional supraventricular couplets (2.7%) and triplets (1.1%). -Suspect SVT/A. fib as the cause of his cardiomyopathy as above.  Seen by EP, started amiodarone 100 mg BID.  Will repeat Zio patch x7 days to evaluate AF/SVT burden since starting amiodarone  Paroxysmal atrial fibrillation: Zio patch x5 days on 06/12/2020 showed 20% AF burden, average rate 134 with longest episode lasting 5.5 hours with average rate 155 bpm.  CHA2DS2-VASc score 3 (CHF, hypertension, age) -Continue carvedilol 3.125 mg twice daily -Continue Xarelto -Continue amiodarone 100 mg twice daily, follows with EP -Suspect untreated OSA, checking sleep study  Hyperlipidemia: LDL 90 on 12/27/2019, was on Repatha but reports he stopped taking.  LHC showed nonobstructive CAD on 07/05/2020.  Has restarted Repatha  Hypertension: Continue Entresto, carvedilol, spironolactone as above  Snoring/daytime somnolence: Suspect untreated OSA, check sleep study  RTC in 2 months   Medication Adjustments/Labs and Tests Ordered: Current medicines are reviewed at length with the patient today.  Concerns regarding medicines are outlined above.  Orders Placed This Encounter  Procedures   MR CARDIAC MORPHOLOGY W WO CONTRAST   Basic metabolic panel   CBC   LONG TERM MONITOR (3-14 DAYS)   EKG 12-Lead   Home sleep test    No orders of the defined types were placed in this encounter.   Patient Instructions   Medication Instructions:  Your physician recommends that you continue on your current medications as directed. Please refer to the Current Medication list given to you today.  *If you need a refill on your cardiac medications before your next appointment, please call your pharmacy*   Lab Work: BMET, CBC today  If you have labs (blood work) drawn today and your tests are completely normal, you will receive your results only by: [place] (if you have MyChart) OR A paper copy in the mail If you have any lab test that is abnormal or we need to change your treatment, we will call you to review the results.   Testing/Procedures: Your physician has requested that you have a cardiac MRI. Cardiac MRI uses a computer to create images of your heart as its beating, producing both still and moving pictures of your heart and major blood vessels. For further information please visit [email or web address] Please follow the instruction sheet given to you today for more information.  ZIO XT- Long Term Monitor Instructions   Your physician has requested you wear a ZIO patch monitor for __7_ days.  This is a single patch monitor.   [employer] supplies one patch monitor per enrollment. Additional stickers are not available. Please do not apply patch if you will be having a Nuclear Stress Test, Echocardiogram, Cardiac CT, MRI, or Chest Xray during the period you would be wearing the monitor. The patch cannot be worn during these tests. You cannot remove and re-apply the ZIO XT patch monitor.  Your ZIO patch monitor will be sent Fed Ex from [employer] directly to your home address. It may take 3-5 days to receive your monitor after you have been enrolled.  Once you have received your monitor, please review the enclosed instructions. Your monitor has already been registered assigning a specific monitor serial # to you.  Billing and Patient Assistance Program Information   We have supplied [employer]  with any of your insurance information on file for billing purposes. [employer] offers  a sliding scale Patient Assistance Program for patients that do not have insurance, or whose insurance does not completely cover the cost of the ZIO monitor.   You must apply for the Patient Assistance Program to qualify for this discounted rate.     To apply, please call [employer] at [phone], select option 4, then select option 2, and ask to apply for Patient Assistance Program.  Theodore Demark will ask your household income, and how many people are in your household.  They will quote your out-of-pocket cost based on that information.  [employer] will also be able to set up a 6-month interest-free payment plan if needed.  Applying the monitor   Shave hair from upper left chest.  Hold abrader disc by orange tab. Rub abrader in 40 strokes over the upper left chest as indicated in your monitor instructions.  Clean area with 4 enclosed alcohol pads. Let dry.  Apply patch as indicated in monitor instructions. Patch will be placed under collarbone on left side of chest with arrow pointing upward.  Rub patch adhesive wings for 2 minutes. Remove white label marked "1". Remove the white label marked "2". Rub patch adhesive wings for 2 additional minutes.  While looking in a mirror, press and release button in center of patch. A small green light will flash 3-4 times. This will be your only indicator that the monitor has been turned on. ?  Do not shower for the first 24 hours. You may shower after the first 24 hours.  Press the button if you feel a symptom. You will hear a small click. Record Date, Time and Symptom in the Patient Logbook.  When you are ready to remove the patch, follow instructions on the last 2 pages of the Patient Logbook. Stick patch monitor onto the last page of Patient Logbook.  Place Patient Logbook in the blue and white box.  Use locking tab on box and tape box closed securely.  The blue and white box has  prepaid postage on it. Please place it in the mailbox as soon as possible. Your physician should have your test results approximately 7 days after [facility] has [facility]  Call IG[facility] Estatesat [phone] you have questions regarding your ZIO XT patch monitor. Call them immediately if you see an orange light blinking on your monitor.  If your monitor falls off in less than 4 days, contact our Monitor department at [phone] ?If your monitor becomes loose or falls off after 4 days call [employer] at [phone] suggestions on securing your monitor.?  Your physician has recommended that you have a home sleep study. This test records several body functions during sleep, including: brain activity, eye movement, oxygen and carbon dioxide blood levels, heart rate and rhythm, breathing rate and rhythm, the flow of air through your mouth and nose, snoring, body muscle movements, and chest and belly movement.   Follow-Up: [facility] you and your health needs are our priority.  As part of our continuing mission to provide you with exceptional heart care, we have created designated Provider Care Teams.  These Care Teams include your primary Cardiologist (physician) and Advanced Practice Providers (APPs -  Physician Assistants and Nurse Practitioners) who all work together to provide you with the care you need, when you need it.  We recommend signing up for the patient portal called "MyChart".  Sign up information is provided on this After Visit Summary.  MyChart is used to connect  with patients for Virtual Visits (Telemedicine).  Patients are able to view lab/test results, encounter notes, upcoming appointments, etc.  Non-urgent messages can be sent to your provider as well.   To learn more about what you can do with MyChart, go to [email or web address].    Your next appointment:   Friday, 10/14 at 10:20 AM with Dr. Seward Grater  [place],acting as a scribe for Donato Heinz, MD.,have documented all relevant documentation on the behalf of Donato Heinz, MD,as directed by  Donato Heinz, MD while in the presence of Donato Heinz, MD.  I, Donato Heinz, MD, have reviewed all documentation for this visit. The documentation on 10/29/20 for the exam, diagnosis, procedures, and orders are all accurate and complete.   Signed, Donato Heinz, MD  10/29/2020 2:17 PM    [facility]

## 2020-10-23 NOTE — Progress Notes (Unsigned)
Patient enrolled for [employer] to mail a 7 day ZIO XT monitor to his home. 

## 2020-10-24 ENCOUNTER — Other Ambulatory Visit: Payer: Self-pay | Admitting: *Deleted

## 2020-10-24 ENCOUNTER — Ambulatory Visit: Payer: Medicare HMO | Admitting: Internal Medicine

## 2020-10-24 DIAGNOSIS — Z79899 Other long term (current) drug therapy: Secondary | ICD-10-CM

## 2020-10-24 DIAGNOSIS — I5042 Chronic combined systolic (congestive) and diastolic (congestive) heart failure: Secondary | ICD-10-CM

## 2020-10-24 LAB — BASIC METABOLIC PANEL
BUN/Creatinine Ratio: 13 (ref 10–24)
BUN: 16 mg/dL (ref 8–27)
CO2: 23 mmol/L (ref 20–29)
Calcium: 9.2 mg/dL (ref 8.6–10.2)
Chloride: 108 mmol/L — ABNORMAL HIGH (ref 96–106)
Creatinine, Ser: 1.25 mg/dL (ref 0.76–1.27)
Glucose: 118 mg/dL — ABNORMAL HIGH (ref 65–99)
Potassium: 4.4 mmol/L (ref 3.5–5.2)
Sodium: 146 mmol/L — ABNORMAL HIGH (ref 134–144)
eGFR: 62 mL/min/{1.73_m2} (ref >59)

## 2020-10-24 LAB — CBC
Hematocrit: 47.7 % (ref 37.5–51.0)
Hemoglobin: 16.5 g/dL (ref 13.0–17.7)
MCH: 31.7 pg (ref 26.6–33.0)
MCHC: 34.6 g/dL (ref 31.5–35.7)
MCV: 92 fL (ref 79–97)
Platelets: 224 10*3/uL (ref 150–450)
RBC: 5.21 x10E6/uL (ref 4.14–5.80)
RDW: 14 % (ref 11.6–15.4)
WBC: 6 10*3/uL (ref 3.4–10.8)

## 2020-10-25 ENCOUNTER — Telehealth: Payer: Self-pay | Admitting: *Deleted

## 2020-10-25 NOTE — Telephone Encounter (Signed)
-----   Message from Silverio Lay, RN sent at 10/23/2020  3:54 PM EDT ----- Regarding: HST HST ordered per Dr. Gardiner Rhyme  Thanks!

## 2020-10-25 NOTE — Telephone Encounter (Signed)
Left message to check his Mychart or call me back  for HST appointment details.

## 2020-10-28 DIAGNOSIS — I471 Supraventricular tachycardia: Secondary | ICD-10-CM

## 2020-10-28 DIAGNOSIS — I4892 Unspecified atrial flutter: Secondary | ICD-10-CM

## 2020-10-28 DIAGNOSIS — I480 Paroxysmal atrial fibrillation: Secondary | ICD-10-CM | POA: Diagnosis not present

## 2020-11-06 ENCOUNTER — Telehealth: Payer: Self-pay | Admitting: *Deleted

## 2020-11-06 DIAGNOSIS — M1712 Unilateral primary osteoarthritis, left knee: Secondary | ICD-10-CM | POA: Diagnosis not present

## 2020-11-06 DIAGNOSIS — I5043 Acute on chronic combined systolic (congestive) and diastolic (congestive) heart failure: Secondary | ICD-10-CM

## 2020-11-06 NOTE — Telephone Encounter (Signed)
Received fax from [employer]-patient assistance approved 11/01/20-03/31/21 for [facility]

## 2020-11-08 DIAGNOSIS — I480 Paroxysmal atrial fibrillation: Secondary | ICD-10-CM | POA: Diagnosis not present

## 2020-11-08 DIAGNOSIS — I471 Supraventricular tachycardia: Secondary | ICD-10-CM | POA: Diagnosis not present

## 2020-11-13 ENCOUNTER — Encounter: Payer: Medicare HMO | Admitting: Sports Medicine

## 2020-11-20 ENCOUNTER — Other Ambulatory Visit: Payer: Self-pay

## 2020-11-20 ENCOUNTER — Ambulatory Visit (INDEPENDENT_AMBULATORY_CARE_PROVIDER_SITE_OTHER): Payer: Medicare HMO | Admitting: Sports Medicine

## 2020-11-20 VITALS — BP 131/69 | HR 47 | Ht 71.0 in | Wt 266.0 lb

## 2020-11-20 DIAGNOSIS — R42 Dizziness and giddiness: Secondary | ICD-10-CM | POA: Insufficient documentation

## 2020-11-20 DIAGNOSIS — I5043 Acute on chronic combined systolic (congestive) and diastolic (congestive) heart failure: Secondary | ICD-10-CM | POA: Diagnosis not present

## 2020-11-20 DIAGNOSIS — N139 Obstructive and reflux uropathy, unspecified: Secondary | ICD-10-CM

## 2020-11-20 DIAGNOSIS — Z0000 Encounter for general adult medical examination without abnormal findings: Secondary | ICD-10-CM | POA: Diagnosis not present

## 2020-11-20 DIAGNOSIS — R5383 Other fatigue: Secondary | ICD-10-CM | POA: Diagnosis not present

## 2020-11-20 DIAGNOSIS — I10 Essential (primary) hypertension: Secondary | ICD-10-CM | POA: Diagnosis not present

## 2020-11-20 NOTE — Progress Notes (Signed)
Subjective:    CC: Annual Physical Exam  HPI:  This patient is here for their annual physical  I reviewed the past medical history, family history, social history, surgical history, and allergies today and no changes were needed.  Please see the problem list section below in epic for further details.  Past Medical History: Past Medical History:  Diagnosis Date   Acute on chronic combined systolic and diastolic CHF (congestive heart failure) ([place]) 07/05/2020   Hyperlipidemia    Hypertension    Osteoarthritis of right knee    Right lumbar radiculitis    SVT (supraventricular tachycardia) ([facility])    Past Surgical History: Past Surgical History:  Procedure Laterality Date   RIGHT/LEFT HEART CATH AND CORONARY ANGIOGRAPHY N/A 07/05/2020   Procedure: RIGHT/LEFT HEART CATH AND CORONARY ANGIOGRAPHY;  Surgeon: Martinique, Peter M, MD;  Location: [facility];  Service: Cardiovascular;  Laterality: N/A;   Social History: Social History   Socioeconomic History   Marital status: Married    Spouse name: Not on file   Number of children: 2   Years of education: Not on file   Highest education level: Not on file  Occupational History    Employer: [employer]    Comment: Regulatory affairs officer  Tobacco Use   Smoking status: Never   Smokeless tobacco: Never  Substance and Sexual Activity   Alcohol use: Yes    Comment: Occasional   Drug use: Not on file   Sexual activity: Not on file  Other Topics Concern   Not on file  Social History Narrative   Not on file   Social Determinants of Health   Financial Resource Strain: Not on file  Food Insecurity: Not on file  Transportation Needs: Not on file  Physical Activity: Not on file  Stress: Not on file  Social Connections: Not on file   Family History: Family History  Problem Relation Age of Onset   CAD Father        MI at age 2-60   Allergies: Allergies  Allergen Reactions   Penicillins Hives   Medications: See med  rec.  Review of Systems: No headache, visual changes, nausea, vomiting, diarrhea, constipation, dizziness, abdominal pain, skin rash, fevers, chills, night sweats, swollen lymph nodes, weight loss, chest pain, body aches, joint swelling, muscle aches, shortness of breath, mood changes, visual or auditory hallucinations.  Objective:    General: Well Developed, well nourished, and in no acute distress.  Neuro: Alert and oriented x3, extra-ocular muscles intact, sensation grossly intact. Cranial nerves II through XII are intact, motor, sensory, and coordinative functions are all intact. HEENT: Normocephalic, atraumatic, pupils equal round reactive to light, neck supple, no masses, no lymphadenopathy, thyroid nonpalpable. Oropharynx, nasopharynx, external ear canals are unremarkable. Skin: Warm and dry, no rashes noted.  Cardiac: Regular rate and rhythm, no murmurs rubs or gallops.  Respiratory: Clear to auscultation bilaterally. Not using accessory muscles, speaking in full sentences.  Abdominal: Soft, nontender, nondistended, positive bowel sounds, no masses, no organomegaly.  Musculoskeletal: Shoulder, elbow, wrist, hip, knee, ankle stable, and with full range of motion.  Impression and Recommendations:    The patient was counselled, risk factors were discussed, anticipatory guidance given.  Annual physical exam Annual physical as above, checking routine labs. He is up-to-date on screening measures. Up-to-date on Shingrix, he also has his second COVID booster scheduled. Return to see me in a year for this.  Essential hypertension Initial blood pressure elevated, follow-up blood pressure has normalized.  Continue current medications. He does have a sleep study coming up, he is reporting excessive drowsiness and tiredness that I think are likely related to sleep apnea.  Fatigue Off of antidepressants. Still feeling somewhat tired, heart rate is in the 40s, I will decrease his carvedilol  dose.  Dizziness Persistent dizziness, I would also like neurology to weigh in here.  Acute on chronic combined systolic and diastolic CHF (congestive heart failure) ([place]) Does have good follow-up with cardiology, on a good regimen for CHF, heart rate is a bit low in the 40s, he is feeling significantly drained and tired, currently on carvedilol 3.125, we will leave the rest of his medications alone and I think we need to drop this down to a half tab twice daily.   ___________________________________________ Gwen Her. Dianah Field, M.D., ABFM., CAQSM. Primary Care and Sports Medicine [facility]  Adjunct Professor of [employer]

## 2020-11-20 NOTE — Assessment & Plan Note (Signed)
Persistent dizziness, I would also like neurology to weigh in here.

## 2020-11-20 NOTE — Assessment & Plan Note (Signed)
Initial blood pressure elevated, follow-up blood pressure has normalized. Continue current medications. He does have a sleep study coming up, he is reporting excessive drowsiness and tiredness that I think are likely related to sleep apnea.

## 2020-11-20 NOTE — Assessment & Plan Note (Signed)
Annual physical as above, checking routine labs. He is up-to-date on screening measures. Up-to-date on Shingrix, he also has his second COVID booster scheduled. Return to see me in a year for this.

## 2020-11-20 NOTE — Assessment & Plan Note (Signed)
Off of antidepressants. Still feeling somewhat tired, heart rate is in the 40s, I will decrease his carvedilol dose.

## 2020-11-20 NOTE — Assessment & Plan Note (Signed)
Does have good follow-up with cardiology, on a good regimen for CHF, heart rate is a bit low in the 40s, he is feeling significantly drained and tired, currently on carvedilol 3.125, we will leave the rest of his medications alone and I think we need to drop this down to a half tab twice daily.

## 2020-11-21 LAB — COMPREHENSIVE METABOLIC PANEL
AG Ratio: 1.8 (calc) (ref 1.0–2.5)
ALT: 53 U/L — ABNORMAL HIGH (ref 9–46)
AST: 31 U/L (ref 10–35)
Albumin: 4.3 g/dL (ref 3.6–5.1)
Alkaline phosphatase (APISO): 59 U/L (ref 35–144)
BUN: 16 mg/dL (ref 7–25)
CO2: 26 mmol/L (ref 20–32)
Calcium: 9 mg/dL (ref 8.6–10.3)
Chloride: 107 mmol/L (ref 98–110)
Creat: 1.28 mg/dL (ref 0.70–1.28)
Globulin: 2.4 g/dL (calc) (ref 1.9–3.7)
Glucose, Bld: 87 mg/dL (ref 65–99)
Potassium: 5 mmol/L (ref 3.5–5.3)
Sodium: 141 mmol/L (ref 135–146)
Total Bilirubin: 0.5 mg/dL (ref 0.2–1.2)
Total Protein: 6.7 g/dL (ref 6.1–8.1)

## 2020-11-21 LAB — PSA, TOTAL AND FREE
PSA, % Free: 25 % (calc) — ABNORMAL LOW (ref >25)
PSA, Free: 0.3 ng/mL
PSA, Total: 1.2 ng/mL (ref <=4.0)

## 2020-11-21 LAB — CBC
HCT: 49.7 % (ref 38.5–50.0)
Hemoglobin: 16.8 g/dL (ref 13.2–17.1)
MCH: 31.5 pg (ref 27.0–33.0)
MCHC: 33.8 g/dL (ref 32.0–36.0)
MCV: 93.2 fL (ref 80.0–100.0)
MPV: 10.9 fL (ref 7.5–12.5)
Platelets: 198 10*3/uL (ref 140–400)
RBC: 5.33 10*6/uL (ref 4.20–5.80)
RDW: 13.8 % (ref 11.0–15.0)
WBC: 5.3 10*3/uL (ref 3.8–10.8)

## 2020-11-21 LAB — LIPID PANEL
Cholesterol: 192 mg/dL (ref <200)
HDL: 55 mg/dL (ref >=40)
LDL Cholesterol (Calc): 112 mg/dL (calc) — ABNORMAL HIGH
Non-HDL Cholesterol (Calc): 137 mg/dL (calc) — ABNORMAL HIGH (ref <130)
Total CHOL/HDL Ratio: 3.5 (calc) (ref <5.0)
Triglycerides: 131 mg/dL (ref <150)

## 2020-11-21 LAB — HEMOGLOBIN A1C
Hgb A1c MFr Bld: 6.2 % of total Hgb — ABNORMAL HIGH (ref <5.7)
Mean Plasma Glucose: 131 mg/dL
eAG (mmol/L): 7.3 mmol/L

## 2020-11-21 LAB — TSH: TSH: 1.92 mIU/L (ref 0.40–4.50)

## 2020-11-21 NOTE — Telephone Encounter (Signed)
We can give Ativan 1 mg for the test, but we do not want to give too much sedation because it is important that he is awake and able to participate in the breath-holds.

## 2020-11-22 MED ORDER — LORAZEPAM 1 MG PO TABS: 1.0000 mg | ORAL_TABLET | Freq: Once | ORAL | 0 refills | Status: AC

## 2020-11-22 NOTE — Addendum Note (Signed)
Addended by: Patria Mane A on: 11/22/2020 12:02 PM   Modules accepted: Orders

## 2020-11-23 MED ORDER — OMEGA-3-ACID ETHYL ESTERS 1 G PO CAPS: 2.0000 g | ORAL_CAPSULE | Freq: Two times a day (BID) | ORAL | 3 refills | Status: AC

## 2020-11-27 ENCOUNTER — Telehealth (HOSPITAL_COMMUNITY): Payer: Self-pay | Admitting: Emergency Medicine

## 2020-11-27 ENCOUNTER — Ambulatory Visit (HOSPITAL_BASED_OUTPATIENT_CLINIC_OR_DEPARTMENT_OTHER): Payer: Medicare HMO | Attending: Cardiology | Admitting: Cardiovascular Disease

## 2020-11-27 ENCOUNTER — Other Ambulatory Visit: Payer: Self-pay

## 2020-11-27 DIAGNOSIS — R0683 Snoring: Secondary | ICD-10-CM

## 2020-11-27 DIAGNOSIS — I10 Essential (primary) hypertension: Secondary | ICD-10-CM

## 2020-11-27 DIAGNOSIS — I480 Paroxysmal atrial fibrillation: Secondary | ICD-10-CM

## 2020-11-27 DIAGNOSIS — R400 Somnolence: Secondary | ICD-10-CM

## 2020-11-27 DIAGNOSIS — G4733 Obstructive sleep apnea (adult) (pediatric): Secondary | ICD-10-CM

## 2020-11-27 DIAGNOSIS — I5042 Chronic combined systolic (congestive) and diastolic (congestive) heart failure: Secondary | ICD-10-CM | POA: Diagnosis not present

## 2020-11-27 DIAGNOSIS — I471 Supraventricular tachycardia: Secondary | ICD-10-CM

## 2020-11-27 NOTE — Telephone Encounter (Signed)
Attempted to call patient regarding upcoming cardiac MR appointment. Left message on voicemail with name and callback number Lamiya Naas RN Navigator Cardiac Imaging [facility] [phone] Office [phone] Cell  

## 2020-11-28 ENCOUNTER — Ambulatory Visit (HOSPITAL_COMMUNITY)
Admission: RE | Admit: 2020-11-28 | Discharge: 2020-11-28 | Disposition: A | Discharge status: Home / Self Care | Payer: Medicare HMO | Source: Ambulatory Visit | Attending: Cardiology | Admitting: Cardiology

## 2020-11-28 ENCOUNTER — Other Ambulatory Visit: Payer: Self-pay

## 2020-11-28 DIAGNOSIS — I5042 Chronic combined systolic (congestive) and diastolic (congestive) heart failure: Secondary | ICD-10-CM | POA: Diagnosis not present

## 2020-11-28 MED ORDER — GADOBUTROL 1 MMOL/ML IV SOLN
10.0000 mL | Freq: Once | INTRAVENOUS | Status: AC | PRN
  Administered 2020-11-28: 10 mL via INTRAVENOUS

## 2020-11-28 MED ORDER — LORAZEPAM 2 MG/ML IJ SOLN
1.0000 mg | Freq: Once | INTRAMUSCULAR | Status: DC
  Filled 2020-11-28: qty 0.5

## 2020-11-28 MED ORDER — LORAZEPAM 2 MG/ML IJ SOLN
INTRAMUSCULAR | Status: AC
  Filled 2020-11-28: qty 1

## 2020-11-30 ENCOUNTER — Telehealth: Payer: Self-pay | Admitting: Cardiology

## 2020-11-30 DIAGNOSIS — I5043 Acute on chronic combined systolic (congestive) and diastolic (congestive) heart failure: Secondary | ICD-10-CM

## 2020-11-30 NOTE — Telephone Encounter (Signed)
*  STAT* If patient is at the pharmacy, call can be transferred to refill team.   1. Which medications need to be refilled? (please list name of each medication and dose if known) carvedilol (COREG) 3.125 MG tablet  2. Which pharmacy/location (including street and city if local pharmacy) is medication to be sent to? [facility] ([facility] - [place], [place]  3. Do they need a 30 day or 90 day supply?  90 day supply

## 2020-12-01 MED ORDER — CARVEDILOL 3.125 MG PO TABS: 3.1250 mg | ORAL_TABLET | Freq: Two times a day (BID) | ORAL | 3 refills | Status: AC

## 2020-12-01 NOTE — Telephone Encounter (Signed)
Refills has been sent to the pharmacy. 

## 2020-12-05 ENCOUNTER — Other Ambulatory Visit: Payer: Self-pay

## 2020-12-05 ENCOUNTER — Other Ambulatory Visit: Payer: Medicare HMO | Admitting: *Deleted

## 2020-12-05 ENCOUNTER — Ambulatory Visit: Payer: Medicare HMO | Admitting: Internal Medicine

## 2020-12-05 VITALS — BP 136/84 | HR 78 | Ht 71.0 in | Wt 263.0 lb

## 2020-12-05 DIAGNOSIS — I480 Paroxysmal atrial fibrillation: Secondary | ICD-10-CM

## 2020-12-05 DIAGNOSIS — I471 Supraventricular tachycardia: Secondary | ICD-10-CM | POA: Diagnosis not present

## 2020-12-05 DIAGNOSIS — I5043 Acute on chronic combined systolic (congestive) and diastolic (congestive) heart failure: Secondary | ICD-10-CM

## 2020-12-05 NOTE — Patient Instructions (Addendum)
Medication Instructions:  Your physician has recommended you make the following change in your medication:   STOP amiodarone  Labwork: None ordered.  Testing/Procedures: None ordered.  Follow-Up: Your physician wants you to follow-up in: 3 months with Cristopher Peru, MD    Any Other Special Instructions Will Be Listed Below (If Applicable).  If you need a refill on your cardiac medications before your next appointment, please call your pharmacy.

## 2020-12-05 NOTE — Progress Notes (Signed)
HPI Robert Guerrero returns today for followup. He has a h/o SVT dating back several years and then developed both atrial fib and flutter. I placed him on low dose amiodarone and he presented with weakness and was noted to still have his heart go out of rhythm almost 10% of the time. He has not had syncope. He notes severe fatigue which he thinks is worse on the amiodarone.  Allergies  Allergen Reactions   Penicillins Hives     Current Outpatient Medications  Medication Sig Dispense Refill   carvedilol (COREG) 3.125 MG tablet Take 1 tablet (3.125 mg total) by mouth 2 (two) times daily with a meal. 180 tablet 3   cyanocobalamin (,VITAMIN B-12,) 1000 MCG/ML injection INJECT ONE ML INTO THE MUSCLE EVERY 30 DAYS 3 mL 3   Empagliflozin (JARDIANCE PO) Take by mouth.     Multiple Vitamins-Minerals (MULTIVITAMIN WITH MINERALS) tablet Take 1 tablet by mouth daily.     NEEDLE, DISP, 18 G 18G X 1" MISC Draw up vitamin B 12 once monthly. 50 each prn   omega-3 acid ethyl esters (LOVAZA) 1 g capsule Take 2 capsules (2 g total) by mouth 2 (two) times daily. 360 capsule 3   Probiotic Product (PROBIOTIC DAILY PO) Take 1 capsule by mouth daily.     REPATHA SURECLICK [id number] MG/ML SOAJ INJECT '140MG'$  UNDER THE SKIN EVERY 14 DAYS 2 mL 11   rivaroxaban (XARELTO) 20 MG TABS tablet Take 1 tablet (20 mg total) by mouth daily with supper. 90 tablet 3   sacubitril-valsartan (ENTRESTO) 49-51 MG Take 1 tablet by mouth 2 (two) times daily.     spironolactone (ALDACTONE) 25 MG tablet Take 0.5 tablets (12.5 mg total) by mouth every other day. 15 tablet 3   Syringe/Needle, Disp, (SYRINGE 3CC/25GX1") 25G X 1" 3 ML MISC Inject vitamin B 12 once monthly. 50 each prn   TURMERIC CURCUMIN PO Take 2,000 mg by mouth daily.     No current facility-administered medications for this visit.     Past Medical History:  Diagnosis Date   Acute on chronic combined systolic and diastolic CHF (congestive heart failure) ([facility]) 07/05/2020    Hyperlipidemia    Hypertension    Osteoarthritis of right knee    Right lumbar radiculitis    SVT (supraventricular tachycardia) ([facility])     ROS:   All systems reviewed and negative except as noted in the HPI.   Past Surgical History:  Procedure Laterality Date   RIGHT/LEFT HEART CATH AND CORONARY ANGIOGRAPHY N/A 07/05/2020   Procedure: RIGHT/LEFT HEART CATH AND CORONARY ANGIOGRAPHY;  Surgeon: Martinique, Peter M, MD;  Location: [facility];  Service: Cardiovascular;  Laterality: N/A;     Family History  Problem Relation Age of Onset   CAD Father        MI at age 61-60     Social History   Socioeconomic History   Marital status: Married    Spouse name: Not on file   Number of children: 2   Years of education: Not on file   Highest education level: Not on file  Occupational History    Employer: [employer]    Comment: Regulatory affairs officer  Tobacco Use   Smoking status: Never   Smokeless tobacco: Never  Substance and Sexual Activity   Alcohol use: Yes    Comment: Occasional   Drug use: Not on file   Sexual activity: Not on file  Other Topics Concern  Not on file  Social History Narrative   Not on file   Social Determinants of Health   Financial Resource Strain: Not on file  Food Insecurity: Not on file  Transportation Needs: Not on file  Physical Activity: Not on file  Stress: Not on file  Social Connections: Not on file  Intimate Partner Violence: Not on file     BP 136/84   Pulse 78   Ht '5\' 11"'$  (1.803 m)   Wt 263 lb (119.3 kg)   SpO2 96%   BMI 36.68 kg/m   Physical Exam:  Well appearing but overweight man, NAD HEENT: Unremarkable Neck:  6 cm JVD, no thyromegally Lymphatics:  No adenopathy Back:  No CVA tenderness Lungs:  Clear with no wheezes.  HEART:  Regular rate rhythm, no murmurs, no rubs, no clicks Abd:  soft, positive bowel sounds, no organomegally, no rebound, no guarding Ext:  2 plus pulses, no edema, no cyanosis, no  clubbing Skin:  No rashes no nodules Neuro:  CN II through XII intact, motor grossly intact  EKG - nsr   Assess/Plan:  Fatigue and malaise - I think that this is due to the amiodarone because his Heart rhythm is improved. He will stop amiodarone. Atrial fib/flutter and AT - I discussed the treatment options including catheter ablation and he would like to wait and see how he does off of the amiodarone, Dofetilide would also be an option in another 3 months. Bradycardia - he has some mostly nocturnal sinus node dysfunction. With his tachy-brady we also discussed PPM and possible AV node ablation but will hold on this for now  HTN - his bp is ok at this point. He will maintain a low sodium diet.  Robert Guerrero Robert Millirons,MD

## 2020-12-06 ENCOUNTER — Telehealth: Payer: Self-pay | Admitting: *Deleted

## 2020-12-06 DIAGNOSIS — D869 Sarcoidosis, unspecified: Secondary | ICD-10-CM

## 2020-12-06 LAB — BASIC METABOLIC PANEL
BUN/Creatinine Ratio: 11 (ref 10–24)
BUN: 17 mg/dL (ref 8–27)
CO2: 21 mmol/L (ref 20–29)
Calcium: 9.5 mg/dL (ref 8.6–10.2)
Chloride: 105 mmol/L (ref 96–106)
Creatinine, Ser: 1.51 mg/dL — ABNORMAL HIGH (ref 0.76–1.27)
Glucose: 82 mg/dL (ref 65–99)
Potassium: 4.4 mmol/L (ref 3.5–5.2)
Sodium: 141 mmol/L (ref 134–144)
eGFR: 49 mL/min/{1.73_m2} — ABNORMAL LOW (ref >59)

## 2020-12-06 NOTE — Telephone Encounter (Signed)
Received fax from [employer]  Patient approved for patient assistance Robert Guerrero) for the remainder of the calendar year.    Patient ID: [id number]

## 2020-12-07 ENCOUNTER — Telehealth: Payer: Self-pay

## 2020-12-07 DIAGNOSIS — G5603 Carpal tunnel syndrome, bilateral upper limbs: Secondary | ICD-10-CM | POA: Diagnosis not present

## 2020-12-07 DIAGNOSIS — R42 Dizziness and giddiness: Secondary | ICD-10-CM | POA: Diagnosis not present

## 2020-12-07 DIAGNOSIS — G609 Hereditary and idiopathic neuropathy, unspecified: Secondary | ICD-10-CM | POA: Diagnosis not present

## 2020-12-07 DIAGNOSIS — M5417 Radiculopathy, lumbosacral region: Secondary | ICD-10-CM | POA: Diagnosis not present

## 2020-12-07 DIAGNOSIS — R202 Paresthesia of skin: Secondary | ICD-10-CM | POA: Diagnosis not present

## 2020-12-07 DIAGNOSIS — R634 Abnormal weight loss: Secondary | ICD-10-CM | POA: Diagnosis not present

## 2020-12-07 DIAGNOSIS — G603 Idiopathic progressive neuropathy: Secondary | ICD-10-CM | POA: Diagnosis not present

## 2020-12-07 DIAGNOSIS — I509 Heart failure, unspecified: Secondary | ICD-10-CM | POA: Diagnosis not present

## 2020-12-07 NOTE — Telephone Encounter (Signed)
Patient made aware of MRI results and the need for additional testing.  Non-contract Chest CT ordered - evaluate for sarcoid in the lungs Cardiac PET Scan @ [facility] will be ordered next week- evaluate for cardiac sarcoid

## 2020-12-07 NOTE — Telephone Encounter (Signed)
Follow up:    Patient returning a call back from yesterday.

## 2020-12-07 NOTE — Telephone Encounter (Signed)
Medication: REPATHA SURECLICK [id number] MG/ML SOAJ Prior authorization determination received Medication has been approved Approval dates: 04/01/2020-03/31/2021  Patient aware via: [place] aware: Yes Provider aware via this encounter

## 2020-12-07 NOTE — Addendum Note (Signed)
Addended by: Newt Minion on: 12/07/2020 12:00 PM   Modules accepted: Orders

## 2020-12-11 ENCOUNTER — Ambulatory Visit: Payer: Medicare HMO

## 2020-12-11 NOTE — Telephone Encounter (Signed)
Order faxed to [facility] for PET  Message sent to precert.

## 2020-12-15 ENCOUNTER — Encounter (HOSPITAL_BASED_OUTPATIENT_CLINIC_OR_DEPARTMENT_OTHER): Payer: Self-pay | Admitting: Cardiovascular Disease

## 2020-12-15 NOTE — Procedures (Signed)
     Patient Name: Robert Guerrero, Robert Guerrero Date: 11/27/2020 Gender: Male D.O.B: 1950-12-12 Age (years): 53 Referring Provider: Oswaldo Milian Height (inches): 71 Interpreting Physician: Shelva Majestic MD, ABSM Weight (lbs): 266 RPSGT: Jacolyn Reedy BMI: 20 MRN: [id number] Neck Size: <br>  CLINICAL INFORMATION Sleep Study Type: HST  Indication for sleep study: snoring, daytime sleepiness, PAF, CHF   Epworth Sleepiness Score: 11  SLEEP STUDY TECHNIQUE A multi-channel overnight portable sleep study was performed. The channels recorded were: nasal airflow, thoracic respiratory movement, and oxygen saturation with a pulse oximetry. Snoring was also monitored.  MEDICATIONS carvedilol (COREG) 3.125 MG tablet cyanocobalamin (,VITAMIN B-12,) 1000 MCG/ML injection Empagliflozin (JARDIANCE PO) Multiple Vitamins-Minerals (MULTIVITAMIN WITH MINERALS) tablet NEEDLE, DISP, 18 G 18G X 1" MISC omega-3 acid ethyl esters (LOVAZA) 1 g capsule Probiotic Product (PROBIOTIC DAILY PO) REPATHA SURECLICK [id number] MG/ML SOAJ rivaroxaban (XARELTO) 20 MG TABS tablet sacubitril-valsartan (ENTRESTO) 49-51 MG spironolactone (ALDACTONE) 25 MG tablet Syringe/Needle, Disp, (SYRINGE 3CC/25GX1") 25G X 1" 3 ML MISC TURMERIC CURCUMIN PO Patient self administered medications include: N/A.  SLEEP ARCHITECTURE Patient was studied for 354.5 minutes. The sleep efficiency was 99.9 % and the patient was supine for 96.7%. The arousal index was 0.0 per hour.  RESPIRATORY PARAMETERS The overall AHI was 65.5 per hour, with a central apnea index of 0 per hour.  The oxygen nadir was 80% during sleep.  CARDIAC DATA Mean heart rate during sleep was 49.5 bpm.  IMPRESSIONS - Severe obstructive sleep apnea occurred during this study (AHI 65.5/h). - Severe oxygen desaturation to a nadir of 80%. - Patient snored 13.1% during the sleep.  DIAGNOSIS - Obstructive Sleep Apnea (G47.33) - Nocturnal Hypoxemia  (G47.36)  RECOMMENDATIONS - In this patient with severe sleep disordered breathing and significant cardiovascular comorbidities including combined systolic/diatolic heart failure and cardiac arrythmias recommend an in-lab CPAP titration study. - Effort should be made to optimize nasal and oropharyngeal patency. - Positional therapy avoiding supine position during sleep. - Avoid alcohol, sedatives and other CNS depressants that may worsen sleep apnea and disrupt normal sleep architecture. - Sleep hygiene should be reviewed to assess factors that may improve sleep quality. - Weight management (BMI37) and regular exercise should be initiated or continued. - Recommend a download and sleep clinic evaluattion afer one month of therapy.    [Electronically signed] 12/15/2020 10:49 AM  Shelva Majestic MD, [facility], [place], American Board of Sleep Medicine   NPI: [id number] [place] PH: [phone]   FX: [phone] [place]

## 2020-12-18 ENCOUNTER — Telehealth: Payer: Self-pay | Admitting: *Deleted

## 2020-12-18 ENCOUNTER — Other Ambulatory Visit: Payer: Self-pay | Admitting: Cardiovascular Disease

## 2020-12-18 ENCOUNTER — Encounter: Payer: Self-pay | Admitting: *Deleted

## 2020-12-18 DIAGNOSIS — I10 Essential (primary) hypertension: Secondary | ICD-10-CM

## 2020-12-18 DIAGNOSIS — G4736 Sleep related hypoventilation in conditions classified elsewhere: Secondary | ICD-10-CM

## 2020-12-18 DIAGNOSIS — I5043 Acute on chronic combined systolic (congestive) and diastolic (congestive) heart failure: Secondary | ICD-10-CM

## 2020-12-18 DIAGNOSIS — R7303 Prediabetes: Secondary | ICD-10-CM | POA: Diagnosis not present

## 2020-12-18 DIAGNOSIS — G4733 Obstructive sleep apnea (adult) (pediatric): Secondary | ICD-10-CM

## 2020-12-18 NOTE — Telephone Encounter (Signed)
Called [facility] to confirm PET scan.   PET scheduled 10/17 at 10:45 AM   Message sent to patient to make aware.

## 2020-12-18 NOTE — Telephone Encounter (Signed)
Patient notified of sleep study results and recommendations. After much conversation he has agreed to proceed with the CPAP titration.

## 2020-12-25 ENCOUNTER — Telehealth: Payer: Self-pay | Admitting: *Deleted

## 2020-12-25 DIAGNOSIS — R7303 Prediabetes: Secondary | ICD-10-CM | POA: Diagnosis not present

## 2020-12-25 NOTE — Telephone Encounter (Signed)
-----   Message from Lauralee Evener, CMA sent at 12/18/2020  4:00 PM EDT ----- CPAP titration

## 2020-12-25 NOTE — Progress Notes (Signed)
Cardiology Office Note:    Date:  12/29/2020   ID:  Robert Guerrero, DOB 12-06-1950, MRN [id number]  PCP:  Silverio Decamp, MD  Cardiologist:  None  Electrophysiologist:  None   Referring MD: Silverio Decamp,*   No chief complaint on file.    History of Present Illness:    Robert Guerrero is a 70 y.o. male with a hx of SVT, hypertension, hyperlipidemia who presents for follow-up.  He was initially seen as an ED follow-up for SVT on 05/29/2020.  He was seen in the ED on 05/22/2020.  He presented to the ED with shortness of breath and palpitations.  Was noted to have intermittent runs of SVT to the 170s.  He reported it had significant caffeine intake and was not keeping up with oral hydration.  He was started on metoprolol 25 mg twice daily and discharge.  He previously followed with Dr. Stanford Breed for his SVT, last seen in 2015.  He was referred to Dr. Lovena Le to consider ablation.  He did not tolerate beta-blockers at that time due to fatigue and did not tolerate calcium channel blockers due to constipation.  Echocardiogram 06/26/2020 showed LVEF 35%, global hypokinesis, mild LV dilatation, grade 2 diastolic dysfunction, mild RV systolic dysfunction, mild to moderate left atrial dilatation, mild to moderate MR, mild AI.  Zio patch x5 days on 06/12/2020 showed 20% A. fib burden, average rate 134 with longest episode lasting 5.5 hours with average rate 155 bpm.  There were 1089 episodes of SVT, longest lasting 14 minutes with average rate 176 bpm.  Frequent PACs (6.2%) along with occasional supraventricular couplets (2.7%) and triplets (1.1%).  LHC/RHC on 07/05/2020 showed nonobstructive CAD (60% mid LAD, 40% ostial to proximal LCx, 40% distal RCA), normal filling pressures (RA 5, RV 35/2, PA 31/9/16, PWCP 13, LVEDP 13, CI 2.4).  Repeat Zio patch on 11/09/2020 showed 9% atrial flutter burden, occasional PACs (3.4% of beats), no SVT.  Cardiac MRI 11/28/2020 showed LVEF 44%, RVEF 49%, focal transmural  LGE in basal inferior wall.  Since last clinic visit, reports feeling anxious.  He stopped taking all medications this week and started taking them on Monday.  Weight is down 6 pounds from last clinic visit.  Wt Readings from Last 3 Encounters:  12/27/20 260 lb 12.8 oz (118.3 kg)  12/05/20 263 lb (119.3 kg)  11/28/20 266 lb (120.7 kg)    Past Medical History:  Diagnosis Date   Acute on chronic combined systolic and diastolic CHF (congestive heart failure) ([place]) 07/05/2020   Hyperlipidemia    Hypertension    Osteoarthritis of right knee    Right lumbar radiculitis    SVT (supraventricular tachycardia) ([facility])     Past Surgical History:  Procedure Laterality Date   RIGHT/LEFT HEART CATH AND CORONARY ANGIOGRAPHY N/A 07/05/2020   Procedure: RIGHT/LEFT HEART CATH AND CORONARY ANGIOGRAPHY;  Surgeon: Martinique, Peter M, MD;  Location: [facility];  Service: Cardiovascular;  Laterality: N/A;    Current Medications: Current Meds  Medication Sig   carvedilol (COREG) 3.125 MG tablet Take 1 tablet (3.125 mg total) by mouth 2 (two) times daily with a meal.   cyanocobalamin (,VITAMIN B-12,) 1000 MCG/ML injection INJECT ONE ML INTO THE MUSCLE EVERY 30 DAYS   Empagliflozin (JARDIANCE PO) Take by mouth.   Multiple Vitamins-Minerals (MULTIVITAMIN WITH MINERALS) tablet Take 1 tablet by mouth daily.   NEEDLE, DISP, 18 G 18G X 1" MISC Draw up vitamin B 12 once monthly.   omega-3 acid  ethyl esters (LOVAZA) 1 g capsule Take 2 capsules (2 g total) by mouth 2 (two) times daily.   Probiotic Product (PROBIOTIC DAILY PO) Take 1 capsule by mouth daily.   REPATHA SURECLICK 833 MG/ML SOAJ INJECT 140MG  UNDER THE SKIN EVERY 14 DAYS   rivaroxaban (XARELTO) 20 MG TABS tablet Take 1 tablet (20 mg total) by mouth daily with supper.   sacubitril-valsartan (ENTRESTO) 49-51 MG Take 1 tablet by mouth 2 (two) times daily.   spironolactone (ALDACTONE) 25 MG tablet Take 0.5 tablets (12.5 mg total) by mouth every other day.    Syringe/Needle, Disp, (SYRINGE 3CC/25GX1") 25G X 1" 3 ML MISC Inject vitamin B 12 once monthly.   TURMERIC CURCUMIN PO Take 2,000 mg by mouth daily.     Allergies:   Penicillins   Social History   Socioeconomic History   Marital status: Married    Spouse name: Not on file   Number of children: 2   Years of education: Not on file   Highest education level: Not on file  Occupational History    Employer: [employer]    Comment: Regulatory affairs officer  Tobacco Use   Smoking status: Never   Smokeless tobacco: Never  Substance and Sexual Activity   Alcohol use: Yes    Comment: Occasional   Drug use: Not on file   Sexual activity: Not on file  Other Topics Concern   Not on file  Social History Narrative   Not on file   Social Determinants of Health   Financial Resource Strain: Not on file  Food Insecurity: Not on file  Transportation Needs: Not on file  Physical Activity: Not on file  Stress: Not on file  Social Connections: Not on file     Family History: The patient's family history includes CAD in his father.  ROS:   Please see the history of present illness.  All other systems reviewed and are negative.  EKGs/Labs/Other Studies Reviewed:    The following studies were reviewed today: Cardiac cath 04/22  Mid LAD lesion is 60% stenosed. Ost Cx to Prox Cx lesion is 40% stenosed. Dist RCA lesion is 40% stenosed with 40% stenosed side branch in RPAV. LV end diastolic pressure is normal.   1. Nonobstructive CAD. Diffuse coronary ectasia. 60% mid LAD 2. Normal LV filling pressures 3. Normal LVEDP 4. Preserved cardiac output index 2.4. 5. Frequent SVT noted during procedure with HR 140 bpm. Responded to IV metoprolol   Plan: medical management.  Echo 03/22:  IMPRESSIONS    1. Frequent ectopy and poor acoustic windows make accurate assessment of  LVEF difficult.. Left ventricular ejection fraction, by estimation, is  35%%. The left ventricle has  moderately decreased function. The left  ventricle demonstrates global  hypokinesis. The left ventricular internal cavity size was mildly dilated.  There is moderate left ventricular hypertrophy. Left ventricular diastolic  parameters are consistent with Grade II diastolic dysfunction  (pseudonormalization).   2. Right ventricular systolic function is mildly reduced. The right  ventricular size is normal.   3. Left atrial size was mild to moderately dilated.   4. The mitral valve is abnormal. Mild to moderate mitral valve  regurgitation.   5. The aortic valve is abnormal. Aortic valve regurgitation is mild. Mild  aortic valve sclerosis is present, with no evidence of aortic valve  stenosis.   6. The inferior vena cava is normal in size with greater than 50%  respiratory variability, suggesting right atrial pressure of 3  mmHg.   EKG:   07/22: sinus bradycardia, rate 59, sinus arrhythmia, left-axis deviation, nonspecific T wave flattening 5/20-sinus rhythm with PACs in a pattern of bigeminy, left axis deviation, poor R wave progression, rate 77 4/22- Normal sinus rhythm with frequent PACs , Left axis deviation, Poor R progression, rate 81  3/22- SVT, rate 149.  Repeat EKG showed sinus tachycardia with PACs/PVCs and run of SVT, rate 129   Recent Labs: 08/18/2020: Magnesium 2.0 12/28/2020: ALT 74; BUN 22; Creatinine, Ser 1.34; Hemoglobin 16.8; Platelets 228; Potassium 4.8; Sodium 142; TSH 1.010  Recent Lipid Panel    Component Value Date/Time   CHOL 192 11/20/2020 0000   TRIG 131 11/20/2020 0000   HDL 55 11/20/2020 0000   CHOLHDL 3.5 11/20/2020 0000   LDLCALC 112 (H) 11/20/2020 0000    Physical Exam:    VS:  BP 120/76   Pulse 88   Ht 5\' 11"  (1.803 m)   Wt 260 lb 12.8 oz (118.3 kg)   SpO2 100%   BMI 36.37 kg/m     Wt Readings from Last 3 Encounters:  12/27/20 260 lb 12.8 oz (118.3 kg)  12/05/20 263 lb (119.3 kg)  11/28/20 266 lb (120.7 kg)     GEN: Well nourished, well  developed in no acute distress HEENT: Normal NECK: No JVD; No carotid bruits CARDIAC: normal rate, irregular, no murmurs, rubs, gallops RESPIRATORY:  Clear to auscultation without rales, wheezing or rhonchi  ABDOMEN: Soft, non-tender, non-distended MUSCULOSKELETAL:  Trace edema SKIN: Warm and dry NEUROLOGIC:  Alert and oriented x 3 PSYCHIATRIC:  Normal affect   ASSESSMENT:    1. Chronic combined systolic (congestive) and diastolic (congestive) heart failure ([place])   2. SVT (supraventricular tachycardia) ([place])   3. PAF (paroxysmal atrial fibrillation) ([place])   4. Medication management   5. Essential hypertension      PLAN:    Chronic combined systolic and diastolic heart failure: Echocardiogram 06/26/2020 showed LVEF 35%, global hypokinesis, mild LV dilatation, grade 2 diastolic dysfunction, mild RV systolic dysfunction, mild to moderate left atrial dilatation, mild to moderate MR, mild AI.  LHC/RHC on 07/05/2020 showed nonobstructive CAD (60% mid LAD, 40% ostial to proximal LCx, 40% distal RCA), normal filling pressures (RA 5, RV 35/2, PA 31/9/16, PWCP 13, LVEDP 13, CI 2.4).  Cardiac MRI 11/28/2020 showed LVEF 44%, RVEF 49%, focal transmural LGE in basal inferior wall. -Given ischemia work-up unremarkable, cardiomyopathy could be tachycardia induced from frequent SVT/A. fib as below.  Follows with the EP, started on amiodarone. Repeat Zio patch showed improvement in SVT/AFL burden on amiodarone.  However amiodarone has been stopped due to fatigue.  Follows with Dr. Lovena Le, considering Tikosyn versus ablation once off amiodarone x3 months -Continue Entresto 49-51 mg twice daily -Continue carvedilol 3.125 mg twice daily -Continue Lasix 20 mg daily as needed for weight gain greater than 3 pounds in 1 day or 5 pounds in 1 week.  Appears euvolemic -Continue spironolactone 12.5 mg qod -Continue Jardiance 10 mg daily -Given cardiac MRI findings, need to rule out sarcoidosis.  [facility]commend [facility] scan at  [facility].  Will check CT chest to evaluate for evidence of pulmonary sarcoid  SVT: Intermittent runs of SVT to 170s during recent ED visit on 05/22/2020.  Was started on metoprolol 25 mg twice daily but states he only took once because thought it may have worsened his hip pain.  Zio patch x5 days on 06/12/2020 showed 20% A. fib burden, average rate 134 with longest episode lasting 5.5  hours with average rate 155 bpm.  There were 1089 episodes of SVT, longest lasting 14 minutes with average rate 176 bpm.  Frequent PACs (6.2%) along with occasional supraventricular couplets (2.7%) and triplets (1.1%). -Suspect SVT/A. fib as the cause of his cardiomyopathy as above.  Seen by EP, started amiodarone 100 mg BID.  Repeat Zio patch on 11/09/2020 showed 9% atrial flutter burden, occasional PACs (3.4% of beats), no SVT.  However amiodarone has been stopped due to fatigue.  Follows with Dr. Lovena Le, considering Tikosyn versus ablation once off amiodarone x3 months  Paroxysmal atrial fibrillation: Zio patch x5 days on 06/12/2020 showed 20% AF burden, average rate 134 with longest episode lasting 5.5 hours with average rate 155 bpm.  CHA2DS2-VASc score 3 (CHF, hypertension, age) -Continue carvedilol 3.125 mg twice daily -Continue Xarelto -Follows with EP -Suspect untreated OSA contributing, starting CPAP  Hyperlipidemia: LDL 90 on 12/27/2019, was on Repatha but reports he stopped taking.  LHC showed nonobstructive CAD on 07/05/2020.  Has restarted Repatha  Hypertension: Continue Entresto, carvedilol, spironolactone as above  OSA: Sleep study 10/2020 showed severe OSA, starting CPAP  RTC in 1 month   Medication Adjustments/Labs and Tests Ordered: Current medicines are reviewed at length with the patient today.  Concerns regarding medicines are outlined above.  Orders Placed This Encounter  Procedures   Comprehensive metabolic panel   CBC   TSH     No orders of the defined types were placed in this  encounter.   Patient Instructions  Medication Instructions:  Your physician recommends that you continue on your current medications as directed. Please refer to the Current Medication list given to you today.  *If you need a refill on your cardiac medications before your next appointment, please call your pharmacy*   Lab Work: CMET, CBC, TSH   If you have labs (blood work) drawn today and your tests are completely normal, you will receive your results only by: [place] (if you have MyChart) OR A paper copy in the mail If you have any lab test that is abnormal or we need to change your treatment, we will call you to review the results.   Follow-Up: At [facility], you and your health needs are our priority.  As part of our continuing mission to provide you with exceptional heart care, we have created designated Provider Care Teams.  These Care Teams include your primary Cardiologist (physician) and Advanced Practice Providers (APPs -  Physician Assistants and Nurse Practitioners) who all work together to provide you with the care you need, when you need it.  We recommend signing up for the patient portal called "MyChart".  Sign up information is provided on this After Visit Summary.  MyChart is used to connect with patients for Virtual Visits (Telemedicine).  Patients are able to view lab/test results, encounter notes, upcoming appointments, etc.  Non-urgent messages can be sent to your provider as well.   To learn more about what you can do with MyChart, go to [email or web address].    Your next appointment:   As scheduled with Dr. Gardiner Rhyme     Signed, Donato Heinz, MD  12/29/2020 8:22 AM    [place]

## 2020-12-25 NOTE — Telephone Encounter (Signed)
Prior Authorization for CPAP titration sent to [facility] via web portal. Tracking Number [id number].

## 2020-12-26 DIAGNOSIS — R7303 Prediabetes: Secondary | ICD-10-CM | POA: Diagnosis not present

## 2020-12-27 ENCOUNTER — Ambulatory Visit: Payer: Medicare HMO | Admitting: Cardiology

## 2020-12-27 ENCOUNTER — Other Ambulatory Visit: Payer: Self-pay

## 2020-12-27 ENCOUNTER — Encounter: Payer: Self-pay | Admitting: Cardiology

## 2020-12-27 VITALS — BP 120/76 | HR 88 | Ht 71.0 in | Wt 260.8 lb

## 2020-12-27 DIAGNOSIS — I10 Essential (primary) hypertension: Secondary | ICD-10-CM | POA: Diagnosis not present

## 2020-12-27 DIAGNOSIS — I5042 Chronic combined systolic (congestive) and diastolic (congestive) heart failure: Secondary | ICD-10-CM | POA: Diagnosis not present

## 2020-12-27 DIAGNOSIS — I480 Paroxysmal atrial fibrillation: Secondary | ICD-10-CM

## 2020-12-27 DIAGNOSIS — I471 Supraventricular tachycardia: Secondary | ICD-10-CM | POA: Diagnosis not present

## 2020-12-27 DIAGNOSIS — Z79899 Other long term (current) drug therapy: Secondary | ICD-10-CM

## 2020-12-27 NOTE — Patient Instructions (Signed)
Medication Instructions:  Your physician recommends that you continue on your current medications as directed. Please refer to the Current Medication list given to you today.  *If you need a refill on your cardiac medications before your next appointment, please call your pharmacy*   Lab Work: CMET, CBC, TSH   If you have labs (blood work) drawn today and your tests are completely normal, you will receive your results only by: [place] (if you have MyChart) OR A paper copy in the mail If you have any lab test that is abnormal or we need to change your treatment, we will call you to review the results.   Follow-Up: At [facility], you and your health needs are our priority.  As part of our continuing mission to provide you with exceptional heart care, we have created designated Provider Care Teams.  These Care Teams include your primary Cardiologist (physician) and Advanced Practice Providers (APPs -  Physician Assistants and Nurse Practitioners) who all work together to provide you with the care you need, when you need it.  We recommend signing up for the patient portal called "MyChart".  Sign up information is provided on this After Visit Summary.  MyChart is used to connect with patients for Virtual Visits (Telemedicine).  Patients are able to view lab/test results, encounter notes, upcoming appointments, etc.  Non-urgent messages can be sent to your provider as well.   To learn more about what you can do with MyChart, go to [email or web address].    Your next appointment:   As scheduled with Dr. Gardiner Rhyme

## 2020-12-28 ENCOUNTER — Ambulatory Visit
Admission: RE | Admit: 2020-12-28 | Discharge: 2020-12-28 | Disposition: A | Discharge status: Home / Self Care | Payer: Medicare HMO | Source: Ambulatory Visit | Attending: Cardiology | Admitting: Cardiology

## 2020-12-28 DIAGNOSIS — I5042 Chronic combined systolic (congestive) and diastolic (congestive) heart failure: Secondary | ICD-10-CM | POA: Diagnosis not present

## 2020-12-28 DIAGNOSIS — I471 Supraventricular tachycardia: Secondary | ICD-10-CM | POA: Diagnosis not present

## 2020-12-28 DIAGNOSIS — D869 Sarcoidosis, unspecified: Secondary | ICD-10-CM

## 2020-12-28 DIAGNOSIS — Z79899 Other long term (current) drug therapy: Secondary | ICD-10-CM | POA: Diagnosis not present

## 2020-12-28 DIAGNOSIS — I700 Atherosclerosis of aorta: Secondary | ICD-10-CM | POA: Diagnosis not present

## 2020-12-28 DIAGNOSIS — J9811 Atelectasis: Secondary | ICD-10-CM | POA: Diagnosis not present

## 2020-12-28 DIAGNOSIS — I480 Paroxysmal atrial fibrillation: Secondary | ICD-10-CM | POA: Diagnosis not present

## 2020-12-28 NOTE — Telephone Encounter (Signed)
Left CPAP titration appointment details on voicemail. [place] # [id number]. Valid dates 01/22/21 to 02/21/21.

## 2020-12-29 LAB — COMPREHENSIVE METABOLIC PANEL
ALT: 74 IU/L — ABNORMAL HIGH (ref 0–44)
AST: 40 IU/L (ref 0–40)
Albumin/Globulin Ratio: 1.7 (ref 1.2–2.2)
Albumin: 4.3 g/dL (ref 3.7–4.7)
Alkaline Phosphatase: 65 IU/L (ref 44–121)
BUN/Creatinine Ratio: 16 (ref 10–24)
BUN: 22 mg/dL (ref 8–27)
Bilirubin Total: 0.3 mg/dL (ref 0.0–1.2)
CO2: 20 mmol/L (ref 20–29)
Calcium: 9.8 mg/dL (ref 8.6–10.2)
Chloride: 105 mmol/L (ref 96–106)
Creatinine, Ser: 1.34 mg/dL — ABNORMAL HIGH (ref 0.76–1.27)
Globulin, Total: 2.5 g/dL (ref 1.5–4.5)
Glucose: 94 mg/dL (ref 70–99)
Potassium: 4.8 mmol/L (ref 3.5–5.2)
Sodium: 142 mmol/L (ref 134–144)
Total Protein: 6.8 g/dL (ref 6.0–8.5)
eGFR: 57 mL/min/{1.73_m2} — ABNORMAL LOW (ref >59)

## 2020-12-29 LAB — CBC
Hematocrit: 49.6 % (ref 37.5–51.0)
Hemoglobin: 16.8 g/dL (ref 13.0–17.7)
MCH: 31.6 pg (ref 26.6–33.0)
MCHC: 33.9 g/dL (ref 31.5–35.7)
MCV: 93 fL (ref 79–97)
Platelets: 228 10*3/uL (ref 150–450)
RBC: 5.32 x10E6/uL (ref 4.14–5.80)
RDW: 13.6 % (ref 11.6–15.4)
WBC: 7 10*3/uL (ref 3.4–10.8)

## 2020-12-29 LAB — TSH: TSH: 1.01 u[IU]/mL (ref 0.450–4.500)

## 2021-01-01 DIAGNOSIS — H04551 Acquired stenosis of right nasolacrimal duct: Secondary | ICD-10-CM | POA: Diagnosis not present

## 2021-01-01 DIAGNOSIS — D3101 Benign neoplasm of right conjunctiva: Secondary | ICD-10-CM | POA: Diagnosis not present

## 2021-01-01 DIAGNOSIS — H2513 Age-related nuclear cataract, bilateral: Secondary | ICD-10-CM | POA: Diagnosis not present

## 2021-01-03 DIAGNOSIS — E1165 Type 2 diabetes mellitus with hyperglycemia: Secondary | ICD-10-CM | POA: Diagnosis not present

## 2021-01-03 DIAGNOSIS — G3184 Mild cognitive impairment, so stated: Secondary | ICD-10-CM | POA: Diagnosis not present

## 2021-01-04 ENCOUNTER — Telehealth: Payer: Self-pay | Admitting: *Deleted

## 2021-01-04 DIAGNOSIS — G603 Idiopathic progressive neuropathy: Secondary | ICD-10-CM | POA: Diagnosis not present

## 2021-01-04 DIAGNOSIS — R42 Dizziness and giddiness: Secondary | ICD-10-CM | POA: Diagnosis not present

## 2021-01-04 DIAGNOSIS — G5603 Carpal tunnel syndrome, bilateral upper limbs: Secondary | ICD-10-CM | POA: Diagnosis not present

## 2021-01-04 DIAGNOSIS — M5441 Lumbago with sciatica, right side: Secondary | ICD-10-CM | POA: Diagnosis not present

## 2021-01-04 DIAGNOSIS — M5442 Lumbago with sciatica, left side: Secondary | ICD-10-CM | POA: Diagnosis not present

## 2021-01-04 NOTE — Telephone Encounter (Signed)
Received fax from [employer] patient assistance  Patient denied for patient assistance for Xarelto.

## 2021-01-08 DIAGNOSIS — E1165 Type 2 diabetes mellitus with hyperglycemia: Secondary | ICD-10-CM | POA: Diagnosis not present

## 2021-01-12 ENCOUNTER — Ambulatory Visit: Payer: Medicare HMO | Admitting: Cardiology

## 2021-01-15 ENCOUNTER — Ambulatory Visit: Payer: Medicare HMO | Admitting: Cardiology

## 2021-01-15 DIAGNOSIS — I509 Heart failure, unspecified: Secondary | ICD-10-CM | POA: Diagnosis not present

## 2021-01-15 DIAGNOSIS — E785 Hyperlipidemia, unspecified: Secondary | ICD-10-CM | POA: Diagnosis not present

## 2021-01-15 DIAGNOSIS — I110 Hypertensive heart disease with heart failure: Secondary | ICD-10-CM | POA: Diagnosis not present

## 2021-01-15 DIAGNOSIS — D8685 Sarcoid myocarditis: Secondary | ICD-10-CM | POA: Diagnosis not present

## 2021-01-15 DIAGNOSIS — I6529 Occlusion and stenosis of unspecified carotid artery: Secondary | ICD-10-CM | POA: Diagnosis not present

## 2021-01-15 DIAGNOSIS — R9439 Abnormal result of other cardiovascular function study: Secondary | ICD-10-CM | POA: Diagnosis not present

## 2021-01-17 DIAGNOSIS — R7303 Prediabetes: Secondary | ICD-10-CM | POA: Diagnosis not present

## 2021-01-22 DIAGNOSIS — R7303 Prediabetes: Secondary | ICD-10-CM | POA: Diagnosis not present

## 2021-01-22 DIAGNOSIS — G3184 Mild cognitive impairment, so stated: Secondary | ICD-10-CM | POA: Diagnosis not present

## 2021-01-25 NOTE — Progress Notes (Signed)
Cardiology Office Note:    Date:  01/26/2021   ID:  Robert Guerrero, DOB April 20, 1950, MRN [id number]  PCP:  Silverio Decamp, MD  Cardiologist:  None  Electrophysiologist:  None   Referring MD: Silverio Decamp,*   Chief Complaint  Patient presents with   Follow-up    2 months.   Cardiomyopathy      History of Present Illness:    Robert Guerrero is a 70 y.o. male with a hx of SVT, hypertension, hyperlipidemia who presents for follow-up.  He was initially seen as an ED follow-up for SVT on 05/29/2020.  He was seen in the ED on 05/22/2020.  He presented to the ED with shortness of breath and palpitations.  Was noted to have intermittent runs of SVT to the 170s.  He reported it had significant caffeine intake and was not keeping up with oral hydration.  He was started on metoprolol 25 mg twice daily and discharge.  He previously followed with Dr. Stanford Breed for his SVT, last seen in 2015.  He was referred to Dr. Lovena Le to consider ablation.  He did not tolerate beta-blockers at that time due to fatigue and did not tolerate calcium channel blockers due to constipation.  Echocardiogram 06/26/2020 showed LVEF 35%, global hypokinesis, mild LV dilatation, grade 2 diastolic dysfunction, mild RV systolic dysfunction, mild to moderate left atrial dilatation, mild to moderate MR, mild AI.  Zio patch x5 days on 06/12/2020 showed 20% A. fib burden, average rate 134 with longest episode lasting 5.5 hours with average rate 155 bpm.  There were 1089 episodes of SVT, longest lasting 14 minutes with average rate 176 bpm.  Frequent PACs (6.2%) along with occasional supraventricular couplets (2.7%) and triplets (1.1%).  LHC/RHC on 07/05/2020 showed nonobstructive CAD (60% mid LAD, 40% ostial to proximal LCx, 40% distal RCA), normal filling pressures (RA 5, RV 35/2, PA 31/9/16, PWCP 13, LVEDP 13, CI 2.4).  Repeat Zio patch on 11/09/2020 showed 9% atrial flutter burden, occasional PACs (3.4% of beats), no SVT.   Cardiac MRI 11/28/2020 showed LVEF 44%, RVEF 49%, focal transmural LGE in basal inferior wall.  Cardiac PET scan at [facility] on 01/15/2021 showed abnormal perfusion in the basal to mid inferior wall, small area of increased metabolic activity in the inferior wall, corresponding to area of decreased perfusion, EF 30%.  Chest CT 12/28/2020 showed no evidence of pulmonary sarcoid.  Since last clinic visit, reports has been doing okay.  Weight is down 5 pounds from last appointment.  Does report having occasional low BP at home.  Denies any chest pain but does continue to have shortness of breath.  Reports lightheadedness but denies any syncope.  No lower extremity edema.   Wt Readings from Last 3 Encounters:  01/26/21 255 lb (115.7 kg)  12/27/20 260 lb 12.8 oz (118.3 kg)  12/05/20 263 lb (119.3 kg)    Past Medical History:  Diagnosis Date   Acute on chronic combined systolic and diastolic CHF (congestive heart failure) ([place]) 07/05/2020   Hyperlipidemia    Hypertension    Osteoarthritis of right knee    Right lumbar radiculitis    SVT (supraventricular tachycardia) (HCC)     Past Surgical History:  Procedure Laterality Date   RIGHT/LEFT HEART CATH AND CORONARY ANGIOGRAPHY N/A 07/05/2020   Procedure: RIGHT/LEFT HEART CATH AND CORONARY ANGIOGRAPHY;  Surgeon: Martinique, Peter M, MD;  Location: [facility] [facility];  Service: Cardiovascular;  Laterality: N/A;    Current Medications: Current Meds  Medication Sig  carvedilol (COREG) 3.125 MG tablet Take 1 tablet (3.125 mg total) by mouth 2 (two) times daily with a meal.   cyanocobalamin (,VITAMIN B-12,) 1000 MCG/ML injection INJECT ONE ML INTO THE MUSCLE EVERY 30 DAYS   Empagliflozin (JARDIANCE PO) Take by mouth.   Multiple Vitamins-Minerals (MULTIVITAMIN WITH MINERALS) tablet Take 1 tablet by mouth daily.   NEEDLE, DISP, 18 G 18G X 1" MISC Draw up vitamin B 12 once monthly.   omega-3 acid ethyl esters (LOVAZA) 1 g capsule Take 2 capsules (2 g total) by  mouth 2 (two) times daily.   Probiotic Product (PROBIOTIC DAILY PO) Take 1 capsule by mouth daily.   REPATHA SURECLICK 389 MG/ML SOAJ INJECT 140MG  UNDER THE SKIN EVERY 14 DAYS   rivaroxaban (XARELTO) 20 MG TABS tablet Take 1 tablet (20 mg total) by mouth daily with supper.   sacubitril-valsartan (ENTRESTO) 49-51 MG Take 1 tablet by mouth 2 (two) times daily.   spironolactone (ALDACTONE) 25 MG tablet Take 0.5 tablets (12.5 mg total) by mouth every other day.   Syringe/Needle, Disp, (SYRINGE 3CC/25GX1") 25G X 1" 3 ML MISC Inject vitamin B 12 once monthly.   TURMERIC CURCUMIN PO Take 2,000 mg by mouth daily.     Allergies:   Penicillins   Social History   Socioeconomic History   Marital status: Married    Spouse name: Not on file   Number of children: 2   Years of education: Not on file   Highest education level: Not on file  Occupational History    Employer: [employer]    Comment: Regulatory affairs officer  Tobacco Use   Smoking status: Never   Smokeless tobacco: Never  Substance and Sexual Activity   Alcohol use: Yes    Comment: Occasional   Drug use: Not on file   Sexual activity: Not on file  Other Topics Concern   Not on file  Social History Narrative   Not on file   Social Determinants of Health   Financial Resource Strain: Not on file  Food Insecurity: Not on file  Transportation Needs: Not on file  Physical Activity: Not on file  Stress: Not on file  Social Connections: Not on file     Family History: The patient's family history includes CAD in his father.  ROS:   Please see the history of present illness.  All other systems reviewed and are negative.  EKGs/Labs/Other Studies Reviewed:    The following studies were reviewed today: Cardiac cath 04/22  Mid LAD lesion is 60% stenosed. Ost Cx to Prox Cx lesion is 40% stenosed. Dist RCA lesion is 40% stenosed with 40% stenosed side branch in RPAV. LV end diastolic pressure is normal.   1.  Nonobstructive CAD. Diffuse coronary ectasia. 60% mid LAD 2. Normal LV filling pressures 3. Normal LVEDP 4. Preserved cardiac output index 2.4. 5. Frequent SVT noted during procedure with HR 140 bpm. Responded to IV metoprolol   Plan: medical management.  Echo 03/22:  IMPRESSIONS    1. Frequent ectopy and poor acoustic windows make accurate assessment of  LVEF difficult.. Left ventricular ejection fraction, by estimation, is  35%%. The left ventricle has moderately decreased function. The left  ventricle demonstrates global  hypokinesis. The left ventricular internal cavity size was mildly dilated.  There is moderate left ventricular hypertrophy. Left ventricular diastolic  parameters are consistent with Grade II diastolic dysfunction  (pseudonormalization).   2. Right ventricular systolic function is mildly reduced. The right  ventricular size  is normal.   3. Left atrial size was mild to moderately dilated.   4. The mitral valve is abnormal. Mild to moderate mitral valve  regurgitation.   5. The aortic valve is abnormal. Aortic valve regurgitation is mild. Mild  aortic valve sclerosis is present, with no evidence of aortic valve  stenosis.   6. The inferior vena cava is normal in size with greater than 50%  respiratory variability, suggesting right atrial pressure of 3 mmHg.   EKG:   10/22: s sinus rhythm, PVCs, rate 75, left axis deviation, Q waves in leads III, aVF, poor R wave progression 07/22: sinus bradycardia, rate 59, sinus arrhythmia, left-axis deviation, nonspecific T wave flattening 5/20-sinus rhythm with PACs in a pattern of bigeminy, left axis deviation, poor R wave progression, rate 77 4/22- Normal sinus rhythm with frequent PACs , Left axis deviation, Poor R progression, rate 81  3/22- SVT, rate 149.  Repeat EKG showed sinus tachycardia with PACs/PVCs and run of SVT, rate 129   Recent Labs: 08/18/2020: Magnesium 2.0 12/28/2020: ALT 74; BUN 22; Creatinine, Ser  1.34; Hemoglobin 16.8; Platelets 228; Potassium 4.8; Sodium 142; TSH 1.010  Recent Lipid Panel    Component Value Date/Time   CHOL 192 11/20/2020 0000   TRIG 131 11/20/2020 0000   HDL 55 11/20/2020 0000   CHOLHDL 3.5 11/20/2020 0000   LDLCALC 112 (H) 11/20/2020 0000    Physical Exam:    VS:  BP 112/70 (BP Location: Left Arm, Patient Position: Sitting, Cuff Size: Large)   Pulse 75   Ht 5\' 11"  (1.803 m)   Wt 255 lb (115.7 kg)   BMI 35.57 kg/m     Wt Readings from Last 3 Encounters:  01/26/21 255 lb (115.7 kg)  12/27/20 260 lb 12.8 oz (118.3 kg)  12/05/20 263 lb (119.3 kg)     GEN: Well nourished, well developed in no acute distress HEENT: Normal NECK: No JVD; No carotid bruits CARDIAC: normal rate, irregular, no murmurs, rubs, gallops RESPIRATORY:  Clear to auscultation without rales, wheezing or rhonchi  ABDOMEN: Soft, non-tender, non-distended MUSCULOSKELETAL:  Trace edema SKIN: Warm and dry NEUROLOGIC:  Alert and oriented x 3 PSYCHIATRIC:  Normal affect   ASSESSMENT:    1. Chronic combined systolic (congestive) and diastolic (congestive) heart failure (HCC)   2. Cardiac sarcoidosis   3. SVT (supraventricular tachycardia) ([place])   4. PAF (paroxysmal atrial fibrillation) ([place])   5. Hyperlipidemia, unspecified hyperlipidemia type   6. Essential hypertension       PLAN:    Chronic combined systolic and diastolic heart failure: Echocardiogram 06/26/2020 showed LVEF 35%, global hypokinesis, mild LV dilatation, grade 2 diastolic dysfunction, mild RV systolic dysfunction, mild to moderate left atrial dilatation, mild to moderate MR, mild AI.  LHC/RHC on 07/05/2020 showed nonobstructive CAD (60% mid LAD, 40% ostial to proximal LCx, 40% distal RCA), normal filling pressures (RA 5, RV 35/2, PA 31/9/16, PWCP 13, LVEDP 13, CI 2.4).  Cardiac MRI 11/28/2020 showed LVEF 44%, RVEF 49%, focal transmural LGE in basal inferior wall.  Cardiac PET scan at [facility] on 01/15/2021 showed small area  of increased metabolic activity in the inferior wall, corresponding to area of decreased perfusion, EF 30%; this mismatch pattern of decreased perfusion in area of increased metabolic activity suggests cardiac sarcoid.  While his degree of cardiomyopathy is out of proportion to this area of decreased perfusion/increase metabolic activity, suspect this is causing his arrhythmias which is leading to tachycardia induced cardiomyopathy -Cardiomyopathy could be tachycardia  induced from frequent SVT/A. fib as below.  Follows with the EP, started on amiodarone. Repeat Zio patch showed improvement in SVT/AFL burden on amiodarone.  However amiodarone has been stopped due to fatigue.  Follows with Dr. Lovena Le, considering Tikosyn versus ablation once off amiodarone x3 months -Continue Entresto 49-51 mg twice daily.  Hold off on further titration as does report some low BPs at home. Check BMET/magnesium -Continue carvedilol 3.125 mg twice daily -Continue Lasix 20 mg daily as needed for weight gain greater than 3 pounds in 1 day or 5 pounds in 1 week.  Appears euvolemic -Continue spironolactone 12.5 mg qod -Continue Jardiance 10 mg daily -Given suspected cardiac sarcoid, will refer to rheumatology for evaluation for immunosuppression -EF remains low (30% on PET on 10/17), will discuss ICD with Dr Lovena Le  Suspected cardiac sarcoid: Cardiac MRI 11/28/2020 showed LVEF 44%, RVEF 49%, focal transmural LGE in basal inferior wall.  Cardiac PET scan at [facility] on 01/15/2021 showed small area of increased metabolic activity in the inferior wall, corresponding to area of decreased perfusion, EF 30%; this mismatch pattern of decreased perfusion in area of increased metabolic activity suggests cardiac sarcoid.  CT chest shows no evidence of pulmonary sarcoid -Referral to rheumatology as above -Will discuss ICD with Dr Lovena Le as above  SVT: Intermittent runs of SVT to 170s during recent ED visit on 05/22/2020.  Was started on  metoprolol 25 mg twice daily but states he only took once because thought it may have worsened his hip pain.  Zio patch x5 days on 06/12/2020 showed 20% A. fib burden, average rate 134 with longest episode lasting 5.5 hours with average rate 155 bpm.  There were 1089 episodes of SVT, longest lasting 14 minutes with average rate 176 bpm.  Frequent PACs (6.2%) along with occasional supraventricular couplets (2.7%) and triplets (1.1%). -Suspect SVT/A. fib as the cause of his cardiomyopathy as above.  Seen by EP, started amiodarone 100 mg BID.  Repeat Zio patch on 11/09/2020 showed 9% atrial flutter burden, occasional PACs (3.4% of beats), no SVT.  However amiodarone has been stopped due to fatigue.  Follows with Dr. Lovena Le, considering Tikosyn versus ablation once off amiodarone x3 months  Paroxysmal atrial fibrillation: Zio patch x5 days on 06/12/2020 showed 20% AF burden, average rate 134 with longest episode lasting 5.5 hours with average rate 155 bpm.  CHA2DS2-VASc score 3 (CHF, hypertension, age) -Continue carvedilol 3.125 mg twice daily -Continue Xarelto -Follows with EP -Suspect untreated OSA contributing, starting CPAP  Hyperlipidemia: LDL 90 on 12/27/2019, was on Repatha but reports he stopped taking.  LHC showed nonobstructive CAD on 07/05/2020.  Has restarted Repatha  Hypertension: Continue Entresto, carvedilol, spironolactone as above  OSA: Sleep study 10/2020 showed severe OSA, starting CPAP  RTC in 6 weeks   Medication Adjustments/Labs and Tests Ordered: Current medicines are reviewed at length with the patient today.  Concerns regarding medicines are outlined above.  No orders of the defined types were placed in this encounter.    No orders of the defined types were placed in this encounter.   Patient Instructions  Medication Instructions:  Your physician recommends that you continue on your current medications as directed. Please refer to the Current Medication list given to you  today.  *If you need a refill on your cardiac medications before your next appointment, please call your pharmacy*   Lab Work: BMET, [place] today  If you have labs (blood work) drawn today and your tests are completely normal, you will  receive your results only by: [place] (if you have MyChart) OR A paper copy in the mail If you have any lab test that is abnormal or we need to change your treatment, we will call you to review the results.  Follow-Up: At [facility], you and your health needs are our priority.  As part of our continuing mission to provide you with exceptional heart care, we have created designated Provider Care Teams.  These Care Teams include your primary Cardiologist (physician) and Advanced Practice Providers (APPs -  Physician Assistants and Nurse Practitioners) who all work together to provide you with the care you need, when you need it.  We recommend signing up for the patient portal called "MyChart".  Sign up information is provided on this After Visit Summary.  MyChart is used to connect with patients for Virtual Visits (Telemedicine).  Patients are able to view lab/test results, encounter notes, upcoming appointments, etc.  Non-urgent messages can be sent to your provider as well.   To learn more about what you can do with MyChart, go to [email or web address].    Your next appointment:   6 week(s)  The format for your next appointment:   In Person  Provider:   Oswaldo Milian, MD   Other Instructions You have been referred to: Rheumotology    Signed, Donato Heinz, MD  01/26/2021 2:17 PM    [place]

## 2021-01-26 ENCOUNTER — Ambulatory Visit: Payer: Medicare HMO | Admitting: Cardiology

## 2021-01-26 ENCOUNTER — Other Ambulatory Visit: Payer: Self-pay

## 2021-01-26 ENCOUNTER — Encounter: Payer: Self-pay | Admitting: Cardiology

## 2021-01-26 VITALS — BP 112/70 | HR 75 | Ht 71.0 in | Wt 255.0 lb

## 2021-01-26 DIAGNOSIS — I471 Supraventricular tachycardia: Secondary | ICD-10-CM | POA: Diagnosis not present

## 2021-01-26 DIAGNOSIS — I5042 Chronic combined systolic (congestive) and diastolic (congestive) heart failure: Secondary | ICD-10-CM

## 2021-01-26 DIAGNOSIS — I10 Essential (primary) hypertension: Secondary | ICD-10-CM

## 2021-01-26 DIAGNOSIS — D8685 Sarcoid myocarditis: Secondary | ICD-10-CM

## 2021-01-26 DIAGNOSIS — E785 Hyperlipidemia, unspecified: Secondary | ICD-10-CM

## 2021-01-26 DIAGNOSIS — I480 Paroxysmal atrial fibrillation: Secondary | ICD-10-CM

## 2021-01-26 NOTE — Patient Instructions (Signed)
Medication Instructions:  Your physician recommends that you continue on your current medications as directed. Please refer to the Current Medication list given to you today.  *If you need a refill on your cardiac medications before your next appointment, please call your pharmacy*   Lab Work: BMET, [place] today  If you have labs (blood work) drawn today and your tests are completely normal, you will receive your results only by: [place] (if you have MyChart) OR A paper copy in the mail If you have any lab test that is abnormal or we need to change your treatment, we will call you to review the results.  Follow-Up: At [facility], you and your health needs are our priority.  As part of our continuing mission to provide you with exceptional heart care, we have created designated Provider Care Teams.  These Care Teams include your primary Cardiologist (physician) and Advanced Practice Providers (APPs -  Physician Assistants and Nurse Practitioners) who all work together to provide you with the care you need, when you need it.  We recommend signing up for the patient portal called "MyChart".  Sign up information is provided on this After Visit Summary.  MyChart is used to connect with patients for Virtual Visits (Telemedicine).  Patients are able to view lab/test results, encounter notes, upcoming appointments, etc.  Non-urgent messages can be sent to your provider as well.   To learn more about what you can do with MyChart, go to [email or web address].    Your next appointment:   6 week(s)  The format for your next appointment:   In Person  Provider:   Oswaldo Milian, MD   Other Instructions You have been referred to: Rheumotology

## 2021-01-27 LAB — BASIC METABOLIC PANEL
BUN/Creatinine Ratio: 20 (ref 10–24)
BUN: 23 mg/dL (ref 8–27)
CO2: 22 mmol/L (ref 20–29)
Calcium: 10 mg/dL (ref 8.6–10.2)
Chloride: 104 mmol/L (ref 96–106)
Creatinine, Ser: 1.15 mg/dL (ref 0.76–1.27)
Glucose: 109 mg/dL — ABNORMAL HIGH (ref 70–99)
Potassium: 4.7 mmol/L (ref 3.5–5.2)
Sodium: 141 mmol/L (ref 134–144)
eGFR: 68 mL/min/{1.73_m2} (ref >59)

## 2021-01-27 LAB — MAGNESIUM: Magnesium: 2.2 mg/dL (ref 1.6–2.3)

## 2021-01-29 DIAGNOSIS — R7303 Prediabetes: Secondary | ICD-10-CM | POA: Diagnosis not present

## 2021-02-01 ENCOUNTER — Ambulatory Visit (HOSPITAL_BASED_OUTPATIENT_CLINIC_OR_DEPARTMENT_OTHER): Payer: Medicare HMO | Attending: Cardiovascular Disease | Admitting: Cardiovascular Disease

## 2021-02-01 ENCOUNTER — Other Ambulatory Visit: Payer: Self-pay

## 2021-02-01 DIAGNOSIS — I10 Essential (primary) hypertension: Secondary | ICD-10-CM | POA: Insufficient documentation

## 2021-02-01 DIAGNOSIS — G4733 Obstructive sleep apnea (adult) (pediatric): Secondary | ICD-10-CM | POA: Insufficient documentation

## 2021-02-01 DIAGNOSIS — G4736 Sleep related hypoventilation in conditions classified elsewhere: Secondary | ICD-10-CM

## 2021-02-01 DIAGNOSIS — I5043 Acute on chronic combined systolic (congestive) and diastolic (congestive) heart failure: Secondary | ICD-10-CM | POA: Insufficient documentation

## 2021-02-05 DIAGNOSIS — H5213 Myopia, bilateral: Secondary | ICD-10-CM | POA: Diagnosis not present

## 2021-02-07 DIAGNOSIS — R7303 Prediabetes: Secondary | ICD-10-CM | POA: Diagnosis not present

## 2021-02-12 DIAGNOSIS — R7303 Prediabetes: Secondary | ICD-10-CM | POA: Diagnosis not present

## 2021-02-16 ENCOUNTER — Encounter (HOSPITAL_BASED_OUTPATIENT_CLINIC_OR_DEPARTMENT_OTHER): Payer: Self-pay | Admitting: Cardiovascular Disease

## 2021-02-16 NOTE — Procedures (Signed)
       Patient Name: Robert Guerrero, Robert Guerrero Date: 02/01/2021 Gender: Male D.O.B: [birth year] Age (years): 60 Referring Provider: Oswaldo Milian Height (inches): 71 Interpreting Physician: Shelva Majestic MD, ABSM Weight (lbs): 260 RPSGT: Baxter Flattery BMI: 36 MRN: [id number] Neck Size: 19.00  CLINICAL INFORMATION The patient is referred for a CPAP titration to treat sleep apnea.  Date of HST: 11/27/2020:  AHI 65.5/h; O2 nadir 80%.  SLEEP STUDY TECHNIQUE As per the AASM Manual for the Scoring of Sleep and Associated Events v2.3 (April 2016) with a hypopnea requiring 4% desaturations.  The channels recorded and monitored were frontal, central and occipital EEG, electrooculogram (EOG), submentalis EMG (chin), nasal and oral airflow, thoracic and abdominal wall motion, anterior tibialis EMG, snore microphone, electrocardiogram, and pulse oximetry. Continuous positive airway pressure (CPAP) was initiated at the beginning of the study and titrated to treat sleep-disordered breathing.  MEDICATIONS Medications self-administered by patient taken the night of the study : N/A  TECHNICIAN COMMENTS Comments added by technician: Patient had difficulty initiating sleep. Comments added by scorer: N/A  RESPIRATORY PARAMETERS Optimal PAP Pressure (cm): 11 AHI at Optimal Pressure (/hr): 1.6 Overall Minimal O2 (%): 89.0 Supine % at Optimal Pressure (%): 100 Minimal O2 at Optimal Pressure (%): 92.0   SLEEP ARCHITECTURE The study was initiated at 10:58:31 PM and ended at 5:01:40 AM.  Sleep onset time was 28.1 minutes and the sleep efficiency was 41.6%%. The total sleep time was 151 minutes.  The patient spent 1.0%% of the night in stage N1 sleep, 80.1%% in stage N2 sleep, 0.0%% in stage N3 and 18.9% in REM.Stage REM latency was 158.5 minutes  Wake after sleep onset was 184.1. Alpha intrusion was absent. Supine sleep was 29.47%.  CARDIAC DATA The 2 lead EKG demonstrated sinus rhythm. The  mean heart rate was 50.9 beats per minute. Other EKG findings include: PACs,  PVCs.  LEG MOVEMENT DATA The total Periodic Limb Movements of Sleep (PLMS) were 0. The PLMS index was 0.0. A PLMS index of <15 is considered normal in adults.  IMPRESSIONS - CPAP was initiated at 5 cm and was titrated to optimal PAP pressure at 11 cm of water (AHI 1.6/h; O2 nadir 92%). - Central sleep apnea was not noted during this titration (CAI = 1.6/h). - Mild oxygen desaturations were observed during this titration (min O2 89.0%). - No snoring was audible during this study. - 2-lead EKG demonstrated: PACs, PVCs - Clinically significant periodic limb movements were not noted during this study. Arousals associated with PLMs were rare.  DIAGNOSIS - Obstructive Sleep Apnea (G47.33)  RECOMMENDATIONS - Recommend an initial trial of CPAP Auto therapy with EPR of 3 at 11- 15  cm H2O with heated humidification.  A Small size Resmed Nasal Pillow Mask AirFit P30 mask was used for the titration. - Effort should be made to optimize nasal and oropharyngeal patency. - Avoid alcohol, sedatives and other CNS depressants that may worsen sleep apnea and disrupt normal sleep architecture. - Sleep hygiene should be reviewed to assess factors that may improve sleep quality. - Weight management (BMI 36) and regular exercise should be initiated or continued. - Recommend a download and sleep clinic evaluation after 4 weeks of therapy.   [Electronically signed] 02/16/2021 10:25 AM  Shelva Majestic MD, [facility] [place], American Board of Sleep Medicine   NPI: [id number] [place] PH: [phone]   FX: [phone] [place]

## 2021-02-19 DIAGNOSIS — R7303 Prediabetes: Secondary | ICD-10-CM | POA: Diagnosis not present

## 2021-02-21 ENCOUNTER — Telehealth: Payer: Self-pay | Admitting: *Deleted

## 2021-02-21 NOTE — Telephone Encounter (Signed)
Left message CPAP titration has been completed. Order for CPAP machine has been placed to [facility] via [facility] portal. Patient also informed due to backorder it may take 4-6 months to receive the machine. Call me back if questions and or concerns.

## 2021-02-21 NOTE — Telephone Encounter (Signed)
-----   Message from Troy Sine, MD sent at 02/16/2021 10:33 AM EST ----- Mariann Laster, please notify pt and set up with DME to initiate CPAP.

## 2021-02-26 DIAGNOSIS — R7303 Prediabetes: Secondary | ICD-10-CM | POA: Diagnosis not present

## 2021-03-05 ENCOUNTER — Ambulatory Visit: Payer: Medicare HMO | Admitting: Cardiology

## 2021-03-05 DIAGNOSIS — R7303 Prediabetes: Secondary | ICD-10-CM | POA: Diagnosis not present

## 2021-03-15 NOTE — Telephone Encounter (Signed)
Error

## 2021-03-19 ENCOUNTER — Ambulatory Visit: Payer: Medicare HMO | Admitting: Internal Medicine

## 2021-03-20 DIAGNOSIS — R7303 Prediabetes: Secondary | ICD-10-CM | POA: Diagnosis not present

## 2021-04-05 DIAGNOSIS — E1165 Type 2 diabetes mellitus with hyperglycemia: Secondary | ICD-10-CM | POA: Diagnosis not present

## 2021-04-09 DIAGNOSIS — R0602 Shortness of breath: Secondary | ICD-10-CM | POA: Diagnosis not present

## 2021-04-09 DIAGNOSIS — K219 Gastro-esophageal reflux disease without esophagitis: Secondary | ICD-10-CM | POA: Diagnosis not present

## 2021-04-09 DIAGNOSIS — I480 Paroxysmal atrial fibrillation: Secondary | ICD-10-CM | POA: Diagnosis not present

## 2021-04-09 DIAGNOSIS — J439 Emphysema, unspecified: Secondary | ICD-10-CM | POA: Diagnosis not present

## 2021-04-09 DIAGNOSIS — I10 Essential (primary) hypertension: Secondary | ICD-10-CM | POA: Diagnosis not present

## 2021-04-09 DIAGNOSIS — I110 Hypertensive heart disease with heart failure: Secondary | ICD-10-CM | POA: Diagnosis not present

## 2021-04-09 DIAGNOSIS — I5043 Acute on chronic combined systolic (congestive) and diastolic (congestive) heart failure: Secondary | ICD-10-CM | POA: Diagnosis not present

## 2021-04-09 DIAGNOSIS — I493 Ventricular premature depolarization: Secondary | ICD-10-CM | POA: Diagnosis not present

## 2021-04-30 DIAGNOSIS — I509 Heart failure, unspecified: Secondary | ICD-10-CM | POA: Diagnosis not present

## 2021-04-30 DIAGNOSIS — I5043 Acute on chronic combined systolic (congestive) and diastolic (congestive) heart failure: Secondary | ICD-10-CM | POA: Diagnosis not present

## 2021-04-30 DIAGNOSIS — J811 Chronic pulmonary edema: Secondary | ICD-10-CM | POA: Diagnosis not present

## 2021-04-30 DIAGNOSIS — I110 Hypertensive heart disease with heart failure: Secondary | ICD-10-CM | POA: Diagnosis not present

## 2021-04-30 DIAGNOSIS — R0602 Shortness of breath: Secondary | ICD-10-CM | POA: Diagnosis not present

## 2021-05-02 DIAGNOSIS — R7303 Prediabetes: Secondary | ICD-10-CM | POA: Diagnosis not present

## 2021-05-05 DIAGNOSIS — I498 Other specified cardiac arrhythmias: Secondary | ICD-10-CM | POA: Diagnosis not present

## 2021-05-07 DIAGNOSIS — R0602 Shortness of breath: Secondary | ICD-10-CM | POA: Diagnosis not present

## 2021-05-16 DIAGNOSIS — R42 Dizziness and giddiness: Secondary | ICD-10-CM | POA: Diagnosis not present

## 2021-05-16 DIAGNOSIS — I2510 Atherosclerotic heart disease of native coronary artery without angina pectoris: Secondary | ICD-10-CM | POA: Diagnosis not present

## 2021-05-16 DIAGNOSIS — I5020 Unspecified systolic (congestive) heart failure: Secondary | ICD-10-CM | POA: Diagnosis not present

## 2021-05-16 DIAGNOSIS — Z6835 Body mass index (BMI) 35.0-35.9, adult: Secondary | ICD-10-CM | POA: Diagnosis not present

## 2021-05-16 DIAGNOSIS — E785 Hyperlipidemia, unspecified: Secondary | ICD-10-CM | POA: Diagnosis not present

## 2021-05-16 DIAGNOSIS — E669 Obesity, unspecified: Secondary | ICD-10-CM | POA: Diagnosis not present

## 2021-05-16 DIAGNOSIS — I480 Paroxysmal atrial fibrillation: Secondary | ICD-10-CM | POA: Diagnosis not present

## 2021-05-16 DIAGNOSIS — I110 Hypertensive heart disease with heart failure: Secondary | ICD-10-CM | POA: Diagnosis not present

## 2021-05-29 DIAGNOSIS — E782 Mixed hyperlipidemia: Secondary | ICD-10-CM | POA: Diagnosis not present

## 2021-05-29 DIAGNOSIS — R531 Weakness: Secondary | ICD-10-CM | POA: Diagnosis not present

## 2021-05-29 DIAGNOSIS — E6609 Other obesity due to excess calories: Secondary | ICD-10-CM | POA: Diagnosis not present

## 2021-05-29 DIAGNOSIS — I10 Essential (primary) hypertension: Secondary | ICD-10-CM | POA: Diagnosis not present

## 2021-05-29 DIAGNOSIS — I5043 Acute on chronic combined systolic (congestive) and diastolic (congestive) heart failure: Secondary | ICD-10-CM | POA: Diagnosis not present

## 2021-05-29 DIAGNOSIS — R0602 Shortness of breath: Secondary | ICD-10-CM | POA: Diagnosis not present

## 2021-05-29 DIAGNOSIS — R42 Dizziness and giddiness: Secondary | ICD-10-CM | POA: Diagnosis not present

## 2021-05-29 DIAGNOSIS — I480 Paroxysmal atrial fibrillation: Secondary | ICD-10-CM | POA: Diagnosis not present

## 2021-05-29 DIAGNOSIS — I471 Supraventricular tachycardia: Secondary | ICD-10-CM | POA: Diagnosis not present

## 2021-05-29 DIAGNOSIS — I110 Hypertensive heart disease with heart failure: Secondary | ICD-10-CM | POA: Diagnosis not present

## 2021-05-29 DIAGNOSIS — Z6835 Body mass index (BMI) 35.0-35.9, adult: Secondary | ICD-10-CM | POA: Diagnosis not present

## 2021-06-12 DIAGNOSIS — I480 Paroxysmal atrial fibrillation: Secondary | ICD-10-CM | POA: Diagnosis not present

## 2021-06-12 DIAGNOSIS — R0602 Shortness of breath: Secondary | ICD-10-CM | POA: Diagnosis not present

## 2021-06-12 DIAGNOSIS — I110 Hypertensive heart disease with heart failure: Secondary | ICD-10-CM | POA: Diagnosis not present

## 2021-06-12 DIAGNOSIS — E782 Mixed hyperlipidemia: Secondary | ICD-10-CM | POA: Diagnosis not present

## 2021-06-12 DIAGNOSIS — I5043 Acute on chronic combined systolic (congestive) and diastolic (congestive) heart failure: Secondary | ICD-10-CM | POA: Diagnosis not present

## 2021-06-12 DIAGNOSIS — I471 Supraventricular tachycardia: Secondary | ICD-10-CM | POA: Diagnosis not present

## 2021-06-12 DIAGNOSIS — R42 Dizziness and giddiness: Secondary | ICD-10-CM | POA: Diagnosis not present

## 2021-06-12 DIAGNOSIS — Z6835 Body mass index (BMI) 35.0-35.9, adult: Secondary | ICD-10-CM | POA: Diagnosis not present

## 2021-06-12 DIAGNOSIS — E6609 Other obesity due to excess calories: Secondary | ICD-10-CM | POA: Diagnosis not present

## 2021-06-19 DIAGNOSIS — R55 Syncope and collapse: Secondary | ICD-10-CM | POA: Diagnosis not present

## 2021-06-19 DIAGNOSIS — R42 Dizziness and giddiness: Secondary | ICD-10-CM | POA: Diagnosis not present

## 2021-06-19 DIAGNOSIS — H9319 Tinnitus, unspecified ear: Secondary | ICD-10-CM | POA: Diagnosis not present

## 2021-06-25 DIAGNOSIS — R200 Anesthesia of skin: Secondary | ICD-10-CM | POA: Diagnosis not present

## 2021-06-25 DIAGNOSIS — R2681 Unsteadiness on feet: Secondary | ICD-10-CM | POA: Diagnosis not present

## 2021-06-25 DIAGNOSIS — R55 Syncope and collapse: Secondary | ICD-10-CM | POA: Diagnosis not present

## 2021-07-02 DIAGNOSIS — E119 Type 2 diabetes mellitus without complications: Secondary | ICD-10-CM | POA: Diagnosis not present

## 2021-07-02 DIAGNOSIS — I82409 Acute embolism and thrombosis of unspecified deep veins of unspecified lower extremity: Secondary | ICD-10-CM | POA: Diagnosis not present

## 2021-07-02 DIAGNOSIS — I82431 Acute embolism and thrombosis of right popliteal vein: Secondary | ICD-10-CM | POA: Diagnosis not present

## 2021-07-02 DIAGNOSIS — E785 Hyperlipidemia, unspecified: Secondary | ICD-10-CM | POA: Diagnosis not present

## 2021-07-02 DIAGNOSIS — M79604 Pain in right leg: Secondary | ICD-10-CM | POA: Diagnosis not present

## 2021-07-02 DIAGNOSIS — I2510 Atherosclerotic heart disease of native coronary artery without angina pectoris: Secondary | ICD-10-CM | POA: Diagnosis not present

## 2021-07-02 DIAGNOSIS — I4891 Unspecified atrial fibrillation: Secondary | ICD-10-CM | POA: Diagnosis not present

## 2021-07-02 DIAGNOSIS — I509 Heart failure, unspecified: Secondary | ICD-10-CM | POA: Diagnosis not present

## 2021-07-02 DIAGNOSIS — I10 Essential (primary) hypertension: Secondary | ICD-10-CM | POA: Diagnosis not present

## 2021-07-02 DIAGNOSIS — I110 Hypertensive heart disease with heart failure: Secondary | ICD-10-CM | POA: Diagnosis not present

## 2021-07-02 DIAGNOSIS — I82411 Acute embolism and thrombosis of right femoral vein: Secondary | ICD-10-CM | POA: Diagnosis not present

## 2021-07-16 DIAGNOSIS — R42 Dizziness and giddiness: Secondary | ICD-10-CM | POA: Diagnosis not present

## 2021-07-16 DIAGNOSIS — I6782 Cerebral ischemia: Secondary | ICD-10-CM | POA: Diagnosis not present

## 2021-07-16 DIAGNOSIS — R2681 Unsteadiness on feet: Secondary | ICD-10-CM | POA: Diagnosis not present

## 2021-07-16 DIAGNOSIS — I639 Cerebral infarction, unspecified: Secondary | ICD-10-CM | POA: Diagnosis not present

## 2021-07-23 DIAGNOSIS — Z8673 Personal history of transient ischemic attack (TIA), and cerebral infarction without residual deficits: Secondary | ICD-10-CM | POA: Diagnosis not present

## 2021-07-23 DIAGNOSIS — G629 Polyneuropathy, unspecified: Secondary | ICD-10-CM | POA: Diagnosis not present

## 2021-07-23 DIAGNOSIS — R2681 Unsteadiness on feet: Secondary | ICD-10-CM | POA: Diagnosis not present

## 2021-08-06 DIAGNOSIS — I471 Supraventricular tachycardia: Secondary | ICD-10-CM | POA: Diagnosis not present

## 2021-08-06 DIAGNOSIS — R55 Syncope and collapse: Secondary | ICD-10-CM | POA: Diagnosis not present

## 2021-08-06 DIAGNOSIS — R197 Diarrhea, unspecified: Secondary | ICD-10-CM | POA: Diagnosis not present

## 2021-08-13 DIAGNOSIS — R2681 Unsteadiness on feet: Secondary | ICD-10-CM | POA: Diagnosis not present

## 2021-08-28 DIAGNOSIS — Z09 Encounter for follow-up examination after completed treatment for conditions other than malignant neoplasm: Secondary | ICD-10-CM | POA: Diagnosis not present

## 2021-08-28 DIAGNOSIS — E785 Hyperlipidemia, unspecified: Secondary | ICD-10-CM | POA: Diagnosis not present

## 2021-08-28 DIAGNOSIS — E119 Type 2 diabetes mellitus without complications: Secondary | ICD-10-CM | POA: Diagnosis not present

## 2021-08-28 DIAGNOSIS — R5383 Other fatigue: Secondary | ICD-10-CM | POA: Diagnosis not present

## 2021-09-03 DIAGNOSIS — E119 Type 2 diabetes mellitus without complications: Secondary | ICD-10-CM | POA: Diagnosis not present

## 2021-09-03 DIAGNOSIS — I509 Heart failure, unspecified: Secondary | ICD-10-CM | POA: Diagnosis not present

## 2021-09-03 DIAGNOSIS — R269 Unspecified abnormalities of gait and mobility: Secondary | ICD-10-CM | POA: Diagnosis not present

## 2021-09-03 DIAGNOSIS — E785 Hyperlipidemia, unspecified: Secondary | ICD-10-CM | POA: Diagnosis not present

## 2021-10-08 DIAGNOSIS — I509 Heart failure, unspecified: Secondary | ICD-10-CM | POA: Diagnosis not present

## 2021-10-08 DIAGNOSIS — K219 Gastro-esophageal reflux disease without esophagitis: Secondary | ICD-10-CM | POA: Diagnosis not present

## 2021-10-08 DIAGNOSIS — R531 Weakness: Secondary | ICD-10-CM | POA: Diagnosis not present

## 2021-10-08 DIAGNOSIS — R5383 Other fatigue: Secondary | ICD-10-CM | POA: Diagnosis not present

## 2021-10-09 DIAGNOSIS — Z09 Encounter for follow-up examination after completed treatment for conditions other than malignant neoplasm: Secondary | ICD-10-CM | POA: Diagnosis not present

## 2021-10-09 DIAGNOSIS — G9331 Postviral fatigue syndrome: Secondary | ICD-10-CM | POA: Diagnosis not present

## 2021-10-09 DIAGNOSIS — E785 Hyperlipidemia, unspecified: Secondary | ICD-10-CM | POA: Diagnosis not present

## 2021-10-09 DIAGNOSIS — E039 Hypothyroidism, unspecified: Secondary | ICD-10-CM | POA: Diagnosis not present

## 2021-10-16 DIAGNOSIS — E785 Hyperlipidemia, unspecified: Secondary | ICD-10-CM | POA: Diagnosis not present

## 2021-10-16 DIAGNOSIS — E119 Type 2 diabetes mellitus without complications: Secondary | ICD-10-CM | POA: Diagnosis not present

## 2021-10-16 DIAGNOSIS — E781 Pure hyperglyceridemia: Secondary | ICD-10-CM | POA: Diagnosis not present

## 2021-11-21 ENCOUNTER — Encounter: Payer: Self-pay | Admitting: General Practice

## 2021-12-17 DIAGNOSIS — I480 Paroxysmal atrial fibrillation: Secondary | ICD-10-CM | POA: Diagnosis not present

## 2021-12-17 DIAGNOSIS — I5022 Chronic systolic (congestive) heart failure: Secondary | ICD-10-CM | POA: Diagnosis not present

## 2022-01-02 DIAGNOSIS — I480 Paroxysmal atrial fibrillation: Secondary | ICD-10-CM | POA: Diagnosis not present

## 2022-01-02 DIAGNOSIS — I5022 Chronic systolic (congestive) heart failure: Secondary | ICD-10-CM | POA: Diagnosis not present

## 2022-01-02 DIAGNOSIS — H903 Sensorineural hearing loss, bilateral: Secondary | ICD-10-CM | POA: Diagnosis not present

## 2022-01-02 DIAGNOSIS — H04551 Acquired stenosis of right nasolacrimal duct: Secondary | ICD-10-CM | POA: Diagnosis not present

## 2022-01-02 DIAGNOSIS — H9190 Unspecified hearing loss, unspecified ear: Secondary | ICD-10-CM | POA: Diagnosis not present

## 2022-01-08 DIAGNOSIS — E119 Type 2 diabetes mellitus without complications: Secondary | ICD-10-CM | POA: Diagnosis not present

## 2022-01-08 DIAGNOSIS — R5383 Other fatigue: Secondary | ICD-10-CM | POA: Diagnosis not present

## 2022-01-14 DIAGNOSIS — E785 Hyperlipidemia, unspecified: Secondary | ICD-10-CM | POA: Diagnosis not present

## 2022-01-14 DIAGNOSIS — I5022 Chronic systolic (congestive) heart failure: Secondary | ICD-10-CM | POA: Diagnosis not present

## 2022-01-14 DIAGNOSIS — E119 Type 2 diabetes mellitus without complications: Secondary | ICD-10-CM | POA: Diagnosis not present

## 2022-07-08 ENCOUNTER — Telehealth: Payer: Self-pay

## 2022-07-08 NOTE — Transitions of Care (Post Inpatient/ED Visit) (Unsigned)
   07/08/2022  Name: Rivaldo Becknell MRN: [id number] DOB: Jun 12, 1950  Today's TOC FU Call Status: Today's TOC FU Call Status:: Unsuccessul Call (1st Attempt) Unsuccessful Call (1st Attempt) Date: 07/08/22  Attempted to reach the patient regarding the most recent Inpatient/ED visit.  Follow Up Plan: Additional outreach attempts will be made to reach the patient to complete the Transitions of Care (Post Inpatient/ED visit) call.   Signature Karena Addison, LPN [facility] Nurse Health Advisor Direct Dial [phone]

## 2022-07-10 NOTE — Transitions of Care (Post Inpatient/ED Visit) (Unsigned)
   07/10/2022  Name: Robert Guerrero MRN: [id number] DOB: [birth year]  Today's TOC FU Call Status: Today's TOC FU Call Status:: Unsuccessful Call (2nd Attempt) Unsuccessful Call (1st Attempt) Date: 07/08/22 Unsuccessful Call (2nd Attempt) Date: 07/10/22  Attempted to reach the patient regarding the most recent Inpatient/ED visit.  Follow Up Plan: Additional outreach attempts will be made to reach the patient to complete the Transitions of Care (Post Inpatient/ED visit) call.   Signature Karena Addison, LPN [facility] Nurse Health Advisor Direct Dial [phone]

## 2022-07-11 NOTE — Transitions of Care (Post Inpatient/ED Visit) (Signed)
   07/11/2022  Name: Robert Guerrero MRN: [id number] DOB: 07-20-1950  Today's TOC FU Call Status: Today's TOC FU Call Status:: Unsuccessful Call (3rd Attempt) Unsuccessful Call (1st Attempt) Date: 07/08/22 Unsuccessful Call (2nd Attempt) Date: 07/10/22 Unsuccessful Call (3rd Attempt) Date: 07/11/22  Attempted to reach the patient regarding the most recent Inpatient/ED visit.  Follow Up Plan: No further outreach attempts will be made at this time. We have been unable to contact the patient.  Signature Karena Addison, LPN [facility] Nurse Health Advisor Direct Dial [phone]

## 2022-12-09 IMAGING — MR MR CARD MORPHOLOGY WO/W CM
45 of 48 series · 45 of 48 positions shown · IV contrast (Contrast agent)
Comparison: none

CLINICAL DATA: Cardiomyopathy evaluation

EXAM:
CARDIAC MRI
TECHNIQUE: The patient was scanned on a 1.5 Tesla Siemens magnet. A dedicated
cardiac coil was used. Functional imaging was done using [employer]
sequences. [DATE], and 4 chamber views were done to assess for RWMA's.
Modified Kattia rule using a short axis stack was used to
calculate an ejection fraction on a dedicated work station using
[employer] software. The patient received 10 cc of Gadavist. After 10
minutes inversion recovery sequences were used to assess for
infiltration and scar tissue.
CONTRAST:  10 cc  of Gadavist

[Series 4: [id number]_bh · axial · 8.0mm · 1.48mm/px · 1 of 20 slices shown]
[im 1/20]
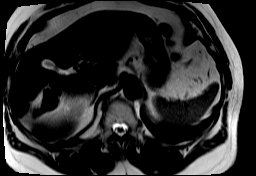

[Series 8: bSSFP · oblique · 8.0mm · 1.70mm/px · 1 of 25 slices shown (1 of 21)]
[im 1/25]
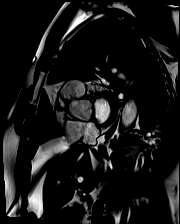

[Series 9: bSSFP · oblique · 8.0mm · 1.70mm/px · 1 of 25 slices shown (2 of 21)]
[im 1/25]
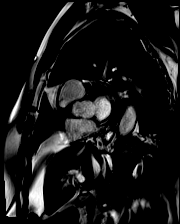

[Series 10: bSSFP · oblique · 8.0mm · 1.70mm/px · 1 of 25 slices shown (3 of 21)]
[im 1/25]
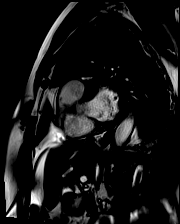

[Series 11: bSSFP · oblique · 8.0mm · 1.70mm/px · 1 of 25 slices shown (4 of 21)]
[im 1/25]
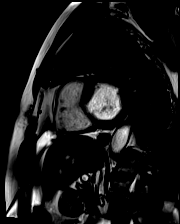

[Series 12: bSSFP · oblique · 8.0mm · 1.70mm/px · 1 of 25 slices shown (5 of 21)]
[im 1/25]
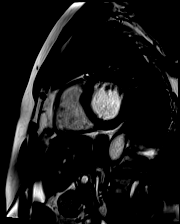

[Series 13: bSSFP · oblique · 8.0mm · 1.70mm/px · 1 of 25 slices shown (6 of 21)]
[im 1/25]
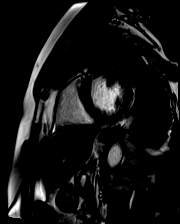

[Series 14: bSSFP · oblique · 8.0mm · 1.70mm/px · 1 of 25 slices shown (7 of 21)]
[im 1/25]
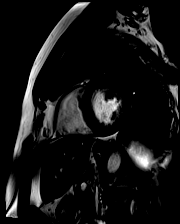

[Series 15: bSSFP · oblique · 8.0mm · 1.70mm/px · 1 of 25 slices shown (8 of 21)]
[im 1/25]
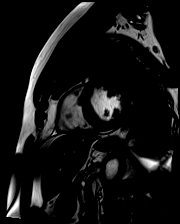

[Series 16: bSSFP · oblique · 8.0mm · 1.70mm/px · 1 of 25 slices shown (9 of 21)]
[im 1/25]
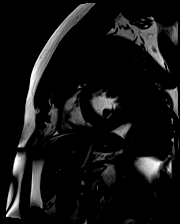

[Series 17: bSSFP · oblique · 8.0mm · 1.70mm/px · 1 of 25 slices shown (10 of 21)]
[im 1/25]
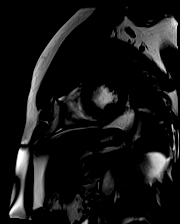

[Series 18: bSSFP · oblique · 8.0mm · 1.70mm/px · 1 of 25 slices shown (11 of 21)]
[im 1/25]
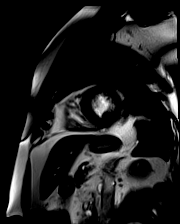

[Series 19: bSSFP · oblique · 8.0mm · 1.70mm/px · 1 of 25 slices shown (12 of 21)]
[im 1/25]
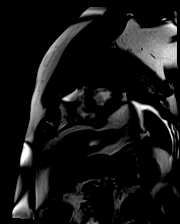

[Series 20: bSSFP · oblique · 8.0mm · 1.70mm/px · 1 of 25 slices shown (13 of 21)]
[im 1/25]
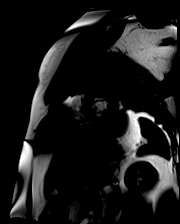

[Series 21: bSSFP · oblique · 8.0mm · 1.70mm/px · 1 of 25 slices shown (14 of 21)]
[im 1/25]
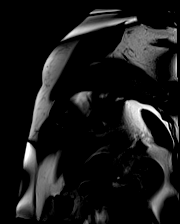

[Series 22: bSSFP · oblique · 8.0mm · 1.70mm/px · 1 of 25 slices shown (15 of 21)]
[im 1/25]
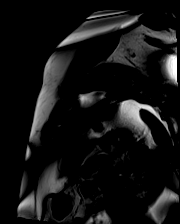

[Series 23: bSSFP · oblique · 8.0mm · 1.70mm/px · 1 of 25 slices shown (16 of 21)]
[im 1/25]
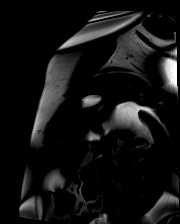

[Series 24: bSSFP · oblique · 8.0mm · 1.70mm/px · 1 of 25 slices shown (17 of 21)]
[im 1/25]
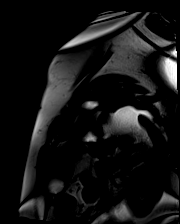

[Series 25: cine_trufi_cs_rt_short axis · oblique · 8.0mm · 1.92mm/px · 1 of 17 slices shown (1 of 18)]
[im 1/17]
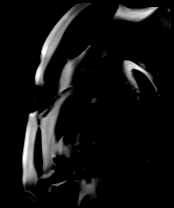

[Series 25: cine_trufi_cs_rt_short axis · oblique · 8.0mm · 1.92mm/px · 1 of 17 slices shown (2 of 18)]
[im 1/17]
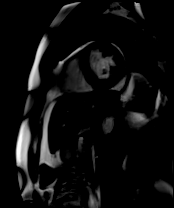

[Series 25: cine_trufi_cs_rt_short axis · oblique · 8.0mm · 1.92mm/px · 1 of 17 slices shown (3 of 18)]
[im 1/17]
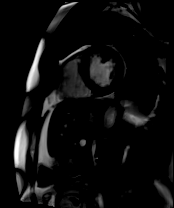

[Series 25: cine_trufi_cs_rt_short axis · oblique · 8.0mm · 1.92mm/px · 1 of 17 slices shown (4 of 18)]
[im 1/17]
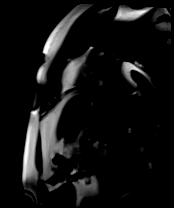

[Series 25: cine_trufi_cs_rt_short axis · oblique · 8.0mm · 1.92mm/px · 1 of 17 slices shown (5 of 18)]
[im 1/17]
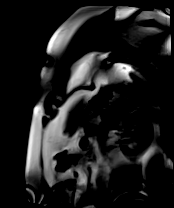

[Series 25: cine_trufi_cs_rt_short axis · oblique · 8.0mm · 1.92mm/px · 1 of 17 slices shown (6 of 18)]
[im 1/17]
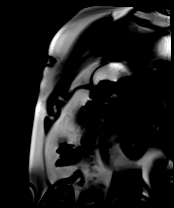

[Series 25: cine_trufi_cs_rt_short axis · oblique · 8.0mm · 1.92mm/px · 1 of 17 slices shown (7 of 18)]
[im 1/17]
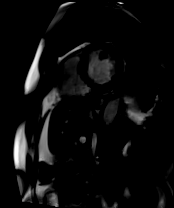

[Series 25: cine_trufi_cs_rt_short axis · oblique · 8.0mm · 1.92mm/px · 1 of 17 slices shown (8 of 18)]
[im 1/17]
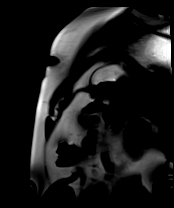

[Series 25: cine_trufi_cs_rt_short axis · oblique · 8.0mm · 1.92mm/px · 1 of 17 slices shown (9 of 18)]
[im 1/17]
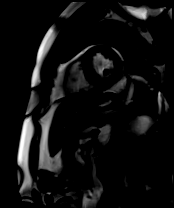

[Series 25: cine_trufi_cs_rt_short axis · oblique · 8.0mm · 1.92mm/px · 1 of 17 slices shown (10 of 18)]
[im 1/17]
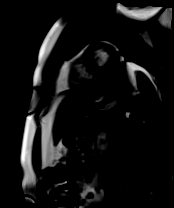

[Series 25: cine_trufi_cs_rt_short axis · oblique · 8.0mm · 1.92mm/px · 1 of 17 slices shown (11 of 18)]
[im 1/17]
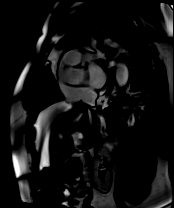

[Series 25: cine_trufi_cs_rt_short axis · oblique · 8.0mm · 1.92mm/px · 1 of 17 slices shown (12 of 18)]
[im 1/17]
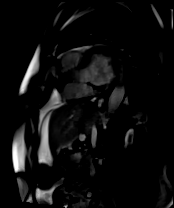

[Series 25: cine_trufi_cs_rt_short axis · oblique · 8.0mm · 1.92mm/px · 1 of 17 slices shown (13 of 18)]
[im 1/17]
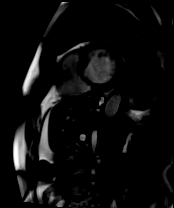

[Series 25: cine_trufi_cs_rt_short axis · oblique · 8.0mm · 1.92mm/px · 1 of 17 slices shown (14 of 18)]
[im 1/17]
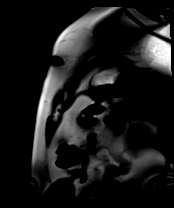

[Series 25: cine_trufi_cs_rt_short axis · oblique · 8.0mm · 1.92mm/px · 1 of 17 slices shown (15 of 18)]
[im 1/17]
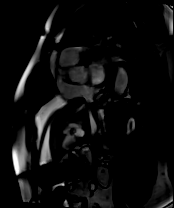

[Series 25: cine_trufi_cs_rt_short axis · oblique · 8.0mm · 1.92mm/px · 1 of 17 slices shown (16 of 18)]
[im 1/17]
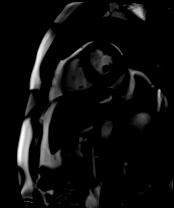

[Series 25: cine_trufi_cs_rt_short axis · oblique · 8.0mm · 1.92mm/px · 1 of 17 slices shown (17 of 18)]
[im 1/17]
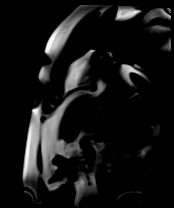

[Series 25: cine_trufi_cs_rt_short axis · oblique · 8.0mm · 1.92mm/px · 1 of 17 slices shown (18 of 18)]
[im 1/17]
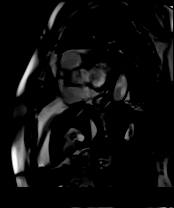

[Series 26: bSSFP · axial · 6.0mm · 1.56mm/px · 1 of 25 slices shown (18 of 21)]
[im 1/25]
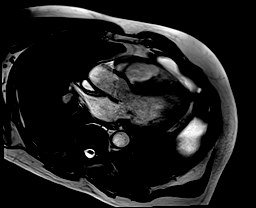

[Series 27: bSSFP · coronal · 6.0mm · 1.41mm/px · 1 of 25 slices shown (19 of 21)]
[im 1/25]
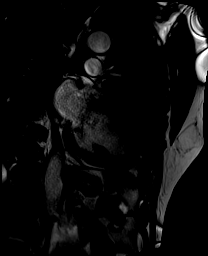

[Series 28: bSSFP · axial · 6.0mm · 1.41mm/px · 1 of 25 slices shown (20 of 21)]
[im 1/25]
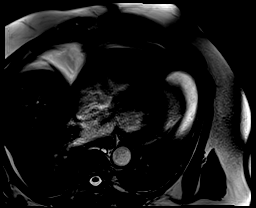

[Series 29: (id)_long_t1 · oblique · 8.0mm · 1.56mm/px · 1 of 24 slices shown]
[im 1/24]
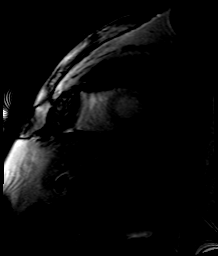

[Series 30: (id)_long_t1_moco · oblique · 8.0mm · 1.56mm/px · 1 of 24 slices shown]
[im 1/24]
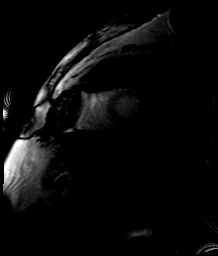

[Series 31: (id)_long_t1_moco_t1 · oblique · 8.0mm · 1.56mm/px · 1 of 6 slices shown]
[im 1/6]
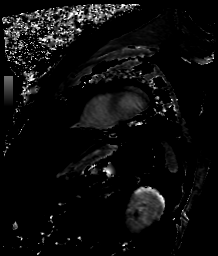

[Series 33: bSSFP · axial · 6.0mm · 1.41mm/px · 1 of 25 slices shown (21 of 21)]
[im 1/25]
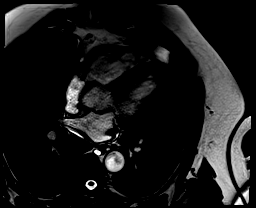

[Series 34: (id)_trufi · oblique · 8.0mm · 2.08mm/px · 1 of 9 slices shown]
[im 1/9]
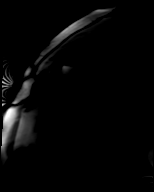

[Series 35: (id)_trufi_moco · oblique · 8.0mm · 2.08mm/px · 1 of 9 slices shown]
[im 1/9]
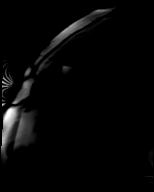

[45 of 48 positions shown; findings below may reference images not displayed]

FINDINGS: Left ventricle:

-Moderate LVH (measuring 15mm in septum and 15mm in posterior wall)

-Normal size

-Mild systolic dysfunction

-Normal ECV (23%)

-Focal transmural LGE in portion of basal inferior wall

LV EF: 44% (Normal 56-78%)

Absolute volumes:

LV EDV: 222mL (Normal 77-195 mL)

LV ESV: 126mL (Normal 19-72 mL)

LV SV: 97mL (Normal 51-133 mL)

CO: 6.2L/min (Normal 2.8-8.8 L/min)

Indexed volumes:

LV EDV: 90mL/sq-m (Normal 47-92 mL/sq-m)

LV ESV: 51mL/sq-m (Normal 13-30 mL/sq-m)

LV SV: 29mL/sq-m (Normal 32-62 mL/sq-m)

CI: 2.5L/min/sq-m (Normal 1.7-4.2 L/min/sq-m)

Right ventricle: Normal size and systolic function

RV EF:  49% (Normal 47-74%)

Absolute volumes:

RV EDV: 213mL (Normal 88-227 mL)

RV ESV: 108mL (Normal 23-103 mL)

RV SV: 105mL (Normal 52-138 mL)

CO: 6.7L/min (Normal 2.8-8.8 L/min)

Indexed volumes:

RV EDV: 87mL/sq-m (Normal 55-105 mL/sq-m)

RV ESV: 44mL/sq-m (Normal 15-43 mL/sq-m)

RV SV: 43mL/sq-m (Normal 32-64 mL/sq-m)

CI: 2.7L/min/sq-m (Normal 1.7-4.2 L/min/sq-m)

Left atrium: Mild enlargement

Right atrium: Moderate enlargement

Mitral valve: Mild regurgitation

Aortic valve: Tricuspid. No regurgitation

Tricuspid valve: Mild regurgitation

Pulmonic valve: No regurgitation

Aorta: Mild dilatation of ascending aorta measuring 38mm

Pericardium: Normal
IMPRESSION: 1. Focal transmural late gadolinium enhancement in portion of basal
inferior wall. Transmural LGE is often ischemic in etiology, but LGE
in this case is not in a coronary distribution, and recent coronary
angiography did not show obstructive CAD. Sarcoidosis can also cause
transmural LGE. Would recommend cardiac PET scan to evaluate for
sarcoid. Would also check CT chest to evaluate for evidence of
pulmonary sarcoid.

2. Normal LV size, moderate hypertrophy, and mild systolic
dysfunction (EF 44%)

3.  Normal RV size and systolic function (EF 49%)

## 2023-01-08 IMAGING — CT CT CHEST W/O CM
1 series · 16 of 34 positions shown, 20 images · non-contrast
Comparison: Cardiac MRI dated November 28, 2020

CLINICAL DATA: Evaluate for sarcoidosis

EXAM:
CT CHEST WITHOUT CONTRAST
TECHNIQUE: Multidetector CT imaging of the chest was performed following the
standard protocol without IV contrast.

[Series 2: chest w/(date) · axial · 0.80mm/px · z∈[-336,-36]mm · 16 of 170 slices shown, 20 images]
[im 13/170  mediastinal]
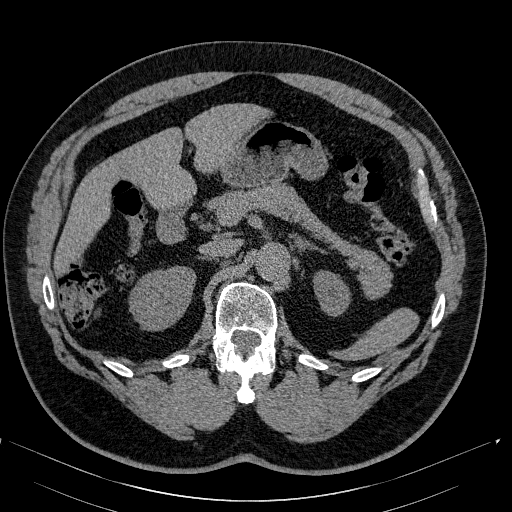
[im 13/170  lung]
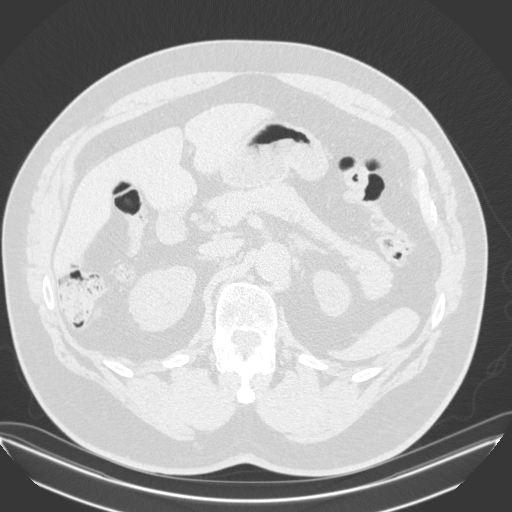
[im 26/170  lung]
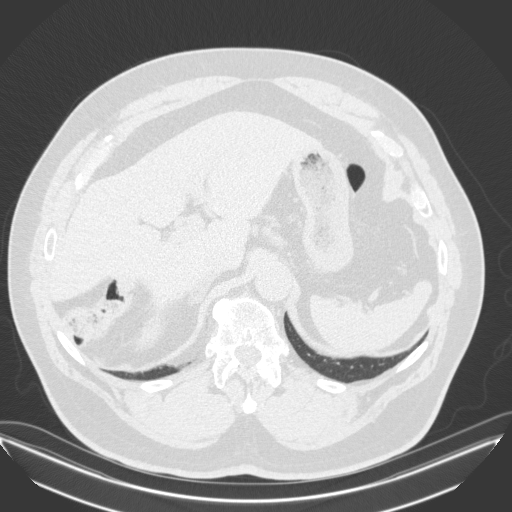
[im 34/170  lung]
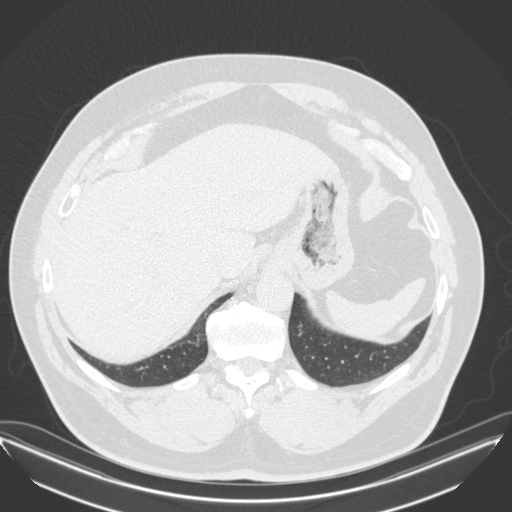
[im 44/170  lung]
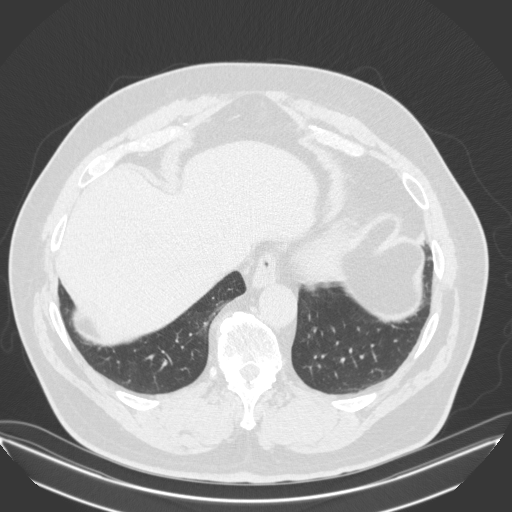
[im 57/170  mediastinal]
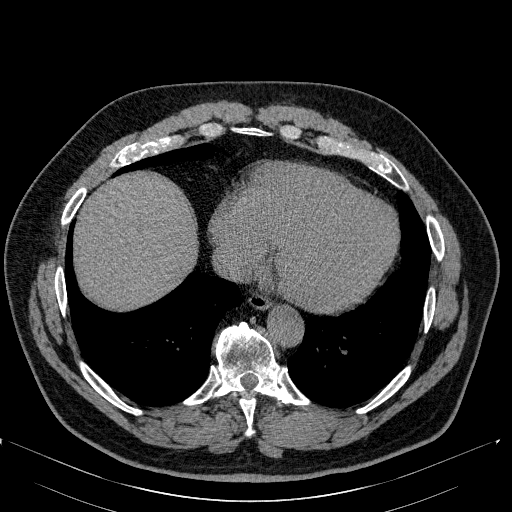
[im 57/170  lung]
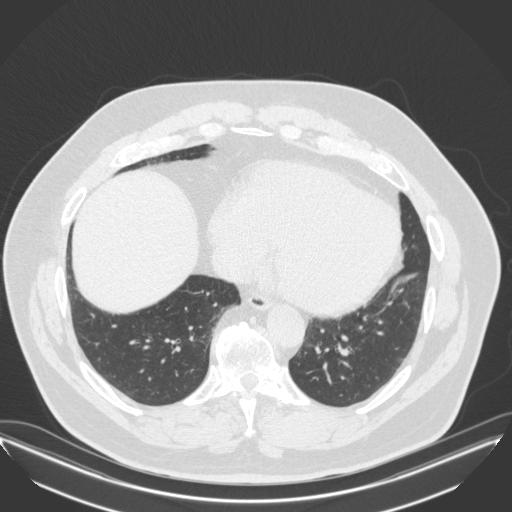
[im 68/170  lung]
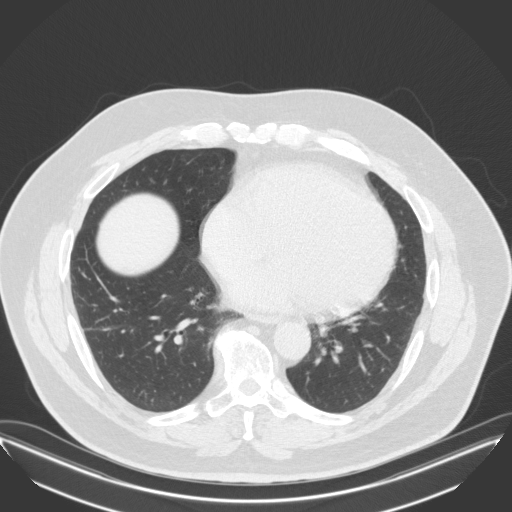
[im 76/170  lung]
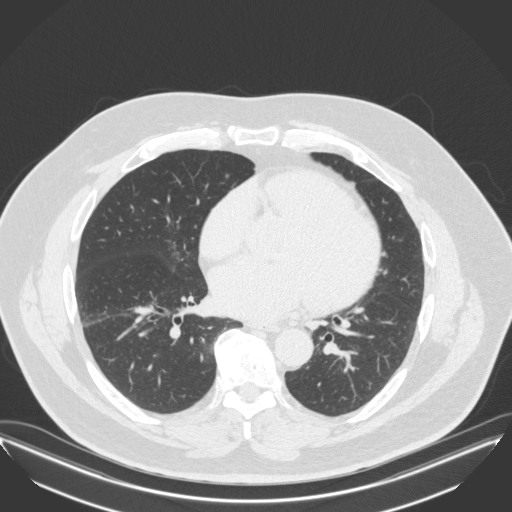
[im 82/170  lung]
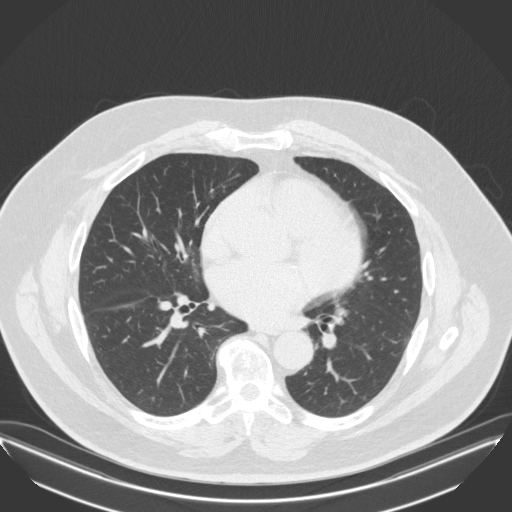
[im 90/170  mediastinal]
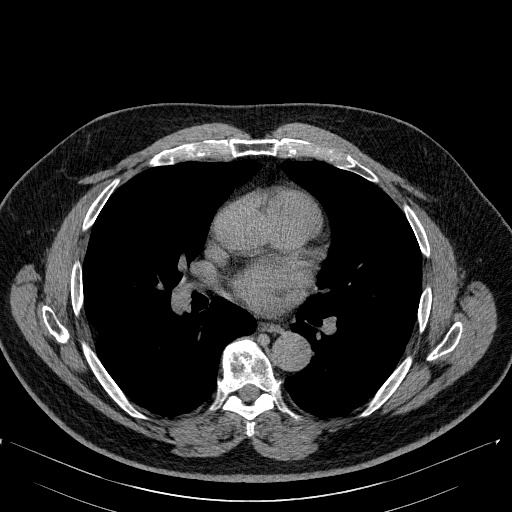
[im 90/170  lung]
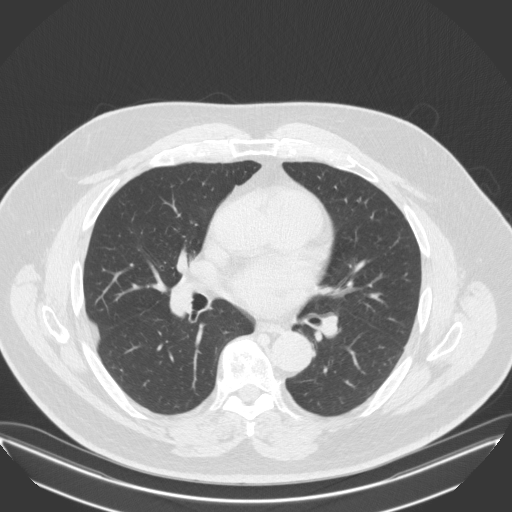
[im 101/170  lung]
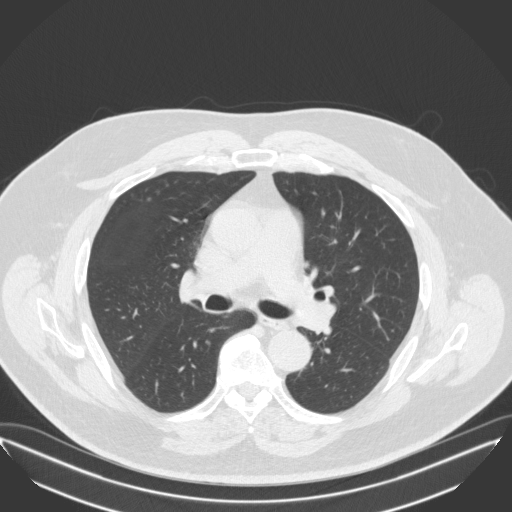
[im 107/170  lung]
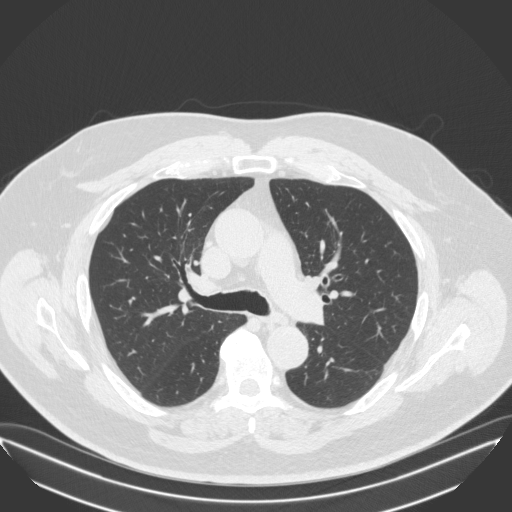
[im 119/170  lung]
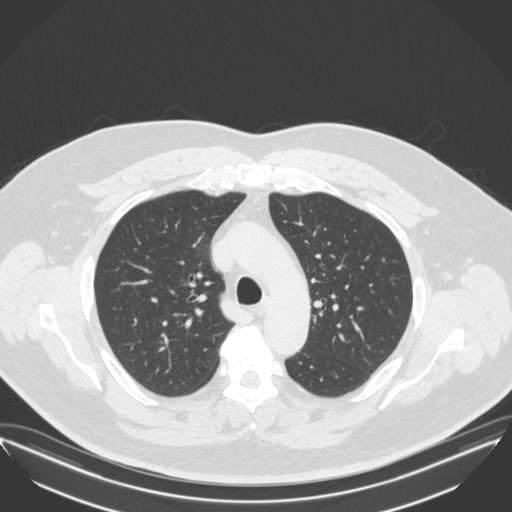
[im 132/170  mediastinal]
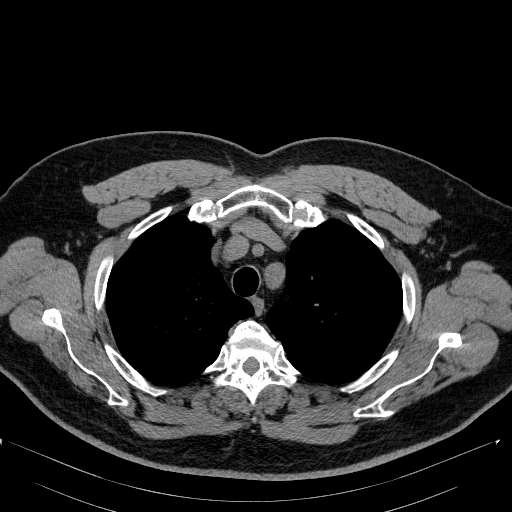
[im 132/170  lung]
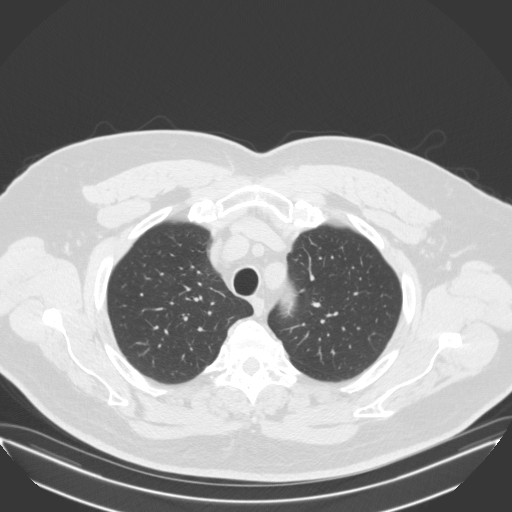
[im 138/170  lung]
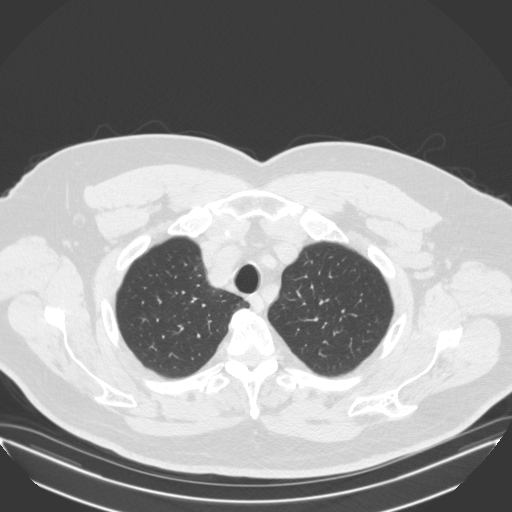
[im 151/170  lung]
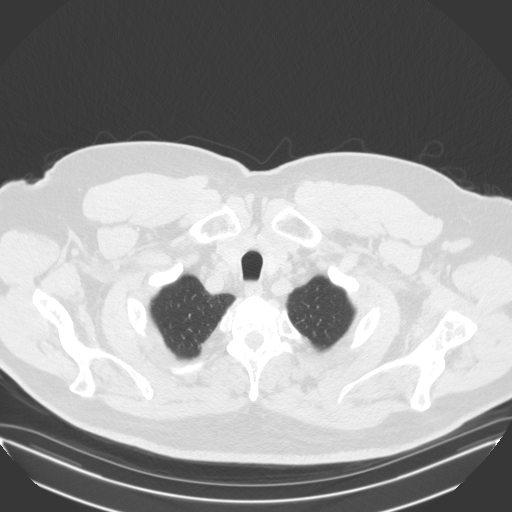
[im 163/170  lung]
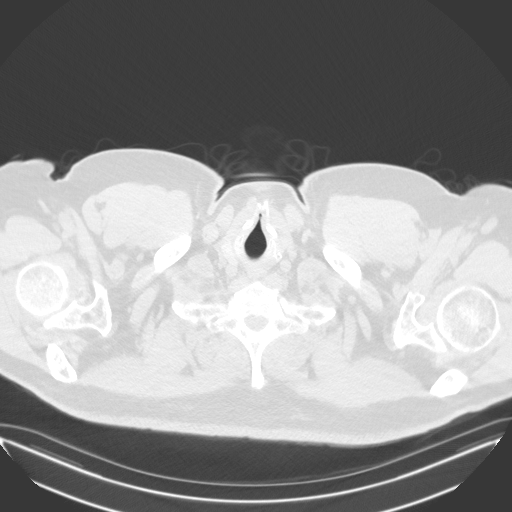

[16 of 34 positions shown; findings below may reference images not displayed]

FINDINGS: Cardiovascular: No pericardial effusion. Calcifications of the LAD
and circumflex. Minimal calcified plaque of the thoracic aorta.

Mediastinum/Nodes: Small hiatal hernia. No pathologically enlarged
lymph nodes seen in the chest.

Lungs/Pleura: Central airways are patent. Mild bibasilar
atelectasis. No consolidation, pleural effusion or pneumothorax.

Upper Abdomen: No acute abnormality.

Musculoskeletal: No chest wall mass or suspicious bone lesions
identified.
IMPRESSION: No CT evidence of pulmonary sarcoidosis.

Aortic Atherosclerosis ([id number]).
